# Patient Record
Sex: Female | Born: 1955 | Race: White | Hispanic: No | Marital: Married | Smoking: Current some day smoker
Health system: Southern US, Community
[De-identification: ages and names within clinical notes are randomized; demographics above are authoritative.]

## PROBLEM LIST (undated history)

## (undated) DIAGNOSIS — R112 Nausea with vomiting, unspecified: Secondary | ICD-10-CM

## (undated) DIAGNOSIS — C561 Malignant neoplasm of right ovary: Secondary | ICD-10-CM

## (undated) DIAGNOSIS — Z9889 Other specified postprocedural states: Secondary | ICD-10-CM

## (undated) DIAGNOSIS — C779 Secondary and unspecified malignant neoplasm of lymph node, unspecified: Secondary | ICD-10-CM

## (undated) DIAGNOSIS — F419 Anxiety disorder, unspecified: Secondary | ICD-10-CM

## (undated) DIAGNOSIS — I341 Nonrheumatic mitral (valve) prolapse: Secondary | ICD-10-CM

## (undated) DIAGNOSIS — I10 Essential (primary) hypertension: Secondary | ICD-10-CM

## (undated) DIAGNOSIS — R0602 Shortness of breath: Secondary | ICD-10-CM

## (undated) DIAGNOSIS — F99 Mental disorder, not otherwise specified: Secondary | ICD-10-CM

## (undated) HISTORY — PX: VASCULAR ACCESS DEVICE INSERTION: SHX5158

## (undated) HISTORY — DX: Malignant neoplasm of right ovary: C56.1

## (undated) HISTORY — DX: Secondary and unspecified malignant neoplasm of lymph node, unspecified: C77.9

## (undated) HISTORY — PX: LEEP: SHX91

## (undated) HISTORY — PX: WISDOM TOOTH EXTRACTION: SHX21

---

## 1999-12-13 ENCOUNTER — Ambulatory Visit (HOSPITAL_COMMUNITY): Admission: RE | Admit: 1999-12-13 | Discharge: 1999-12-13 | Payer: Self-pay | Admitting: Gynecology

## 1999-12-13 ENCOUNTER — Encounter (INDEPENDENT_AMBULATORY_CARE_PROVIDER_SITE_OTHER): Payer: Self-pay | Admitting: Specialist

## 2000-12-08 ENCOUNTER — Other Ambulatory Visit: Admission: RE | Admit: 2000-12-08 | Discharge: 2000-12-08 | Payer: Self-pay | Admitting: Gynecology

## 2001-12-30 ENCOUNTER — Other Ambulatory Visit: Admission: RE | Admit: 2001-12-30 | Discharge: 2001-12-30 | Payer: Self-pay | Admitting: Gynecology

## 2003-01-05 ENCOUNTER — Other Ambulatory Visit: Admission: RE | Admit: 2003-01-05 | Discharge: 2003-01-05 | Payer: Self-pay | Admitting: Gynecology

## 2004-01-29 ENCOUNTER — Other Ambulatory Visit: Admission: RE | Admit: 2004-01-29 | Discharge: 2004-01-29 | Payer: Self-pay | Admitting: Gynecology

## 2005-02-03 ENCOUNTER — Other Ambulatory Visit: Admission: RE | Admit: 2005-02-03 | Discharge: 2005-02-03 | Payer: Self-pay | Admitting: Gynecology

## 2006-03-17 ENCOUNTER — Other Ambulatory Visit: Admission: RE | Admit: 2006-03-17 | Discharge: 2006-03-17 | Payer: Self-pay | Admitting: Gynecology

## 2007-03-24 ENCOUNTER — Other Ambulatory Visit: Admission: RE | Admit: 2007-03-24 | Discharge: 2007-03-24 | Payer: Self-pay | Admitting: Gynecology

## 2008-06-21 ENCOUNTER — Ambulatory Visit (HOSPITAL_COMMUNITY): Admission: RE | Admit: 2008-06-21 | Discharge: 2008-06-21 | Payer: Self-pay | Admitting: Gynecology

## 2009-12-25 ENCOUNTER — Encounter: Admission: RE | Admit: 2009-12-25 | Discharge: 2009-12-25 | Payer: Self-pay | Admitting: Gynecology

## 2011-01-22 ENCOUNTER — Other Ambulatory Visit: Payer: Self-pay | Admitting: Gynecology

## 2011-01-22 DIAGNOSIS — Z1231 Encounter for screening mammogram for malignant neoplasm of breast: Secondary | ICD-10-CM

## 2011-01-27 ENCOUNTER — Ambulatory Visit
Admission: RE | Admit: 2011-01-27 | Discharge: 2011-01-27 | Disposition: A | Discharge status: Home / Self Care | Payer: BLUE CROSS/BLUE SHIELD | Source: Ambulatory Visit | Attending: Gynecology | Admitting: Gynecology

## 2011-01-27 ENCOUNTER — Other Ambulatory Visit: Payer: Self-pay | Admitting: Gynecology

## 2011-01-27 DIAGNOSIS — R928 Other abnormal and inconclusive findings on diagnostic imaging of breast: Secondary | ICD-10-CM

## 2011-01-27 DIAGNOSIS — Z1231 Encounter for screening mammogram for malignant neoplasm of breast: Secondary | ICD-10-CM

## 2011-01-28 ENCOUNTER — Ambulatory Visit
Admission: RE | Admit: 2011-01-28 | Discharge: 2011-01-28 | Disposition: A | Discharge status: Home / Self Care | Payer: BLUE CROSS/BLUE SHIELD | Source: Ambulatory Visit | Attending: Gynecology | Admitting: Gynecology

## 2011-01-28 DIAGNOSIS — R928 Other abnormal and inconclusive findings on diagnostic imaging of breast: Secondary | ICD-10-CM

## 2011-01-29 ENCOUNTER — Other Ambulatory Visit: Payer: BLUE CROSS/BLUE SHIELD

## 2011-04-07 ENCOUNTER — Encounter: Payer: Self-pay | Admitting: Family Medicine

## 2011-04-07 ENCOUNTER — Inpatient Hospital Stay (INDEPENDENT_AMBULATORY_CARE_PROVIDER_SITE_OTHER)
Admission: RE | Admit: 2011-04-07 | Discharge: 2011-04-07 | Disposition: A | Discharge status: Home / Self Care | Payer: Commercial Managed Care - PPO | Source: Ambulatory Visit | Attending: Family Medicine | Admitting: Family Medicine

## 2011-04-07 DIAGNOSIS — J069 Acute upper respiratory infection, unspecified: Secondary | ICD-10-CM

## 2011-09-15 NOTE — Progress Notes (Signed)
Summary: cough rm 2   Vital Signs:  Patient Profile:   55 Years Old Female CC:      Cough x 2wks Height:     66 inches Weight:      144.25 pounds O2 Sat:      97 % O2 treatment:    Room Air Temp:     98.7 degrees F oral Pulse rate:   97 / minute Resp:     18 per minute BP sitting:   159 / 92  (left arm) Cuff size:   regular  Vitals Entered By: Clemens Catholic LPN (April 07, 2011 9:05 AM)                  Updated Prior Medication List: HRT patch Current Allergies: ! CODEINEHistory of Present Illness Chief Complaint: Cough x 2wks History of Present Illness:  Subjective: Patient complains of a URI that started about two weeks ago.  All symptoms have improved except for a mild lingering cough, somewhat worse at night.  She was prescribed a Z-pack initially.  She feels well otherwise.  She smokes one pack per day    No pleuritic pain No wheezing No nasal congestion at present No post-nasal drainage No sinus pain/pressure No itchy/red eyes No earache No hemoptysis No SOB No fever/chills No nausea No vomiting No abdominal pain No diarrhea No skin rashes No fatigue No myalgias No headache    REVIEW OF SYSTEMS Constitutional Symptoms      Denies fever, chills, night sweats, weight loss, weight gain, and fatigue.  Eyes       Denies change in vision, eye pain, eye discharge, glasses, contact lenses, and eye surgery. Ear/Nose/Throat/Mouth       Complains of sinus problems.      Denies hearing loss/aids, change in hearing, ear pain, ear discharge, dizziness, frequent runny nose, frequent nose bleeds, sore throat, hoarseness, and tooth pain or bleeding.  Respiratory       Complains of dry cough.      Denies productive cough, wheezing, shortness of breath, asthma, bronchitis, and emphysema/COPD.  Cardiovascular       Denies murmurs, chest pain, and tires easily with exhertion.    Gastrointestinal       Denies stomach pain, nausea/vomiting, diarrhea, constipation,  blood in bowel movements, and indigestion. Genitourniary       Denies painful urination, kidney stones, and loss of urinary control. Neurological       Denies paralysis, seizures, and fainting/blackouts. Musculoskeletal       Denies muscle pain, joint pain, joint stiffness, decreased range of motion, redness, swelling, muscle weakness, and gout.  Skin       Denies bruising, unusual mles/lumps or sores, and hair/skin or nail changes.  Psych       Denies mood changes, temper/anger issues, anxiety/stress, speech problems, depression, and sleep problems. Other Comments: pt states that she had a cold 2 wks ago, which turned into a sinus infection. she was given a zpak and all of her s/s improved except for the cough. she states that she does not feel bad and no fever. no OTC meds.   Past History:  Past Medical History: Unremarkable  Past Surgical History: Denies surgical history  Family History: father- deceased from MI  at age 82 Family History Hypertension- both parents  Social History: Current Smoker 1 PPD  Alcohol use-no Drug use-no Smoking Status:  current Drug Use:  no   Objective:  Appearance:  Patient appears healthy, stated age, and  in no acute distress  Eyes:  Pupils are equal, round, and reactive to light and accomodation.  Extraocular movement is intact.  Conjunctivae are not inflamed.  Ears:  Canals normal.  Tympanic membranes normal.   Nose:  Mildly congested turbinates.  No sinus tenderness  Pharynx:  Normal  Neck:  Supple.  No adenopathy is present.  Lungs:  Clear to auscultation.  Breath sounds are equal.  Heart:  Regular rate and rhythm without murmurs, rubs, or gallops.  Abdomen:  Nontender without masses or hepatosplenomegaly.  Bowel sounds are present.  No CVA or flank tenderness.  Extremities:  No edema.   Assessment New Problems: UPPER RESPIRATORY INFECTION, VIRAL (ICD-465.9)  RESOLVING VIRAL URI.  NO EVIDENCE BACTERIAL INFECTION TODAY.  Plan New  Medications/Changes: AZITHROMYCIN 250 MG TABS (AZITHROMYCIN) Two tabs by mouth on day 1, then 1 tab daily on days 2 through 5 (Rx void after 04/14/11)  #6 tabs x 0, 04/07/2011, Donna Christen MD BENZONATATE 200 MG CAPS (BENZONATATE) One by mouth hs as needed cough  #12 x 0, 04/07/2011, Donna Christen MD  New Orders: Pulse Oximetry (single measurment) [96045] New Patient Level III [40981] Planning Comments:   Treat symptomatically for now:  Increase fluid intake, begin expectorant,  cough suppressant at bedtime.  If fever/chills/sweats persist, or if not improving 5  days begin Z-pack (given Rx to hold).  Followup with PCP if not improving 7 to 10 days.   The patient and/or caregiver has been counseled thoroughly with regard to medications prescribed including dosage, schedule, interactions, rationale for use, and possible side effects and they verbalize understanding.  Diagnoses and expected course of recovery discussed and will return if not improved as expected or if the condition worsens. Patient and/or caregiver verbalized understanding.  Prescriptions: AZITHROMYCIN 250 MG TABS (AZITHROMYCIN) Two tabs by mouth on day 1, then 1 tab daily on days 2 through 5 (Rx void after 04/14/11)  #6 tabs x 0   Entered and Authorized by:   Donna Christen MD   Signed by:   Donna Christen MD on 04/07/2011   Method used:   Print then Give to Patient   RxID:   [id number] BENZONATATE 200 MG CAPS (BENZONATATE) One by mouth hs as needed cough  #12 x 0   Entered and Authorized by:   Donna Christen MD   Signed by:   Donna Christen MD on 04/07/2011   Method used:   Print then Give to Patient   RxID:   [id number]   Patient Instructions: 1)  Take plain Robitussin (guaifenesin) with plenty of fluids. 2)  Begin Azithromycin if not improving about 5 days or if persistent fever develops. 3)  Followup with family doctor if not improving 7 to 10 days.   Orders Added: 1)  Pulse Oximetry (single measurment)  [94760] 2)  New Patient Level III [44010]

## 2012-03-11 ENCOUNTER — Other Ambulatory Visit: Payer: Self-pay | Admitting: Gynecology

## 2012-03-11 DIAGNOSIS — Z1231 Encounter for screening mammogram for malignant neoplasm of breast: Secondary | ICD-10-CM

## 2012-03-17 ENCOUNTER — Ambulatory Visit
Admission: RE | Admit: 2012-03-17 | Discharge: 2012-03-17 | Disposition: A | Discharge status: Home / Self Care | Payer: Commercial Managed Care - PPO | Source: Ambulatory Visit | Attending: Gynecology | Admitting: Gynecology

## 2012-03-17 DIAGNOSIS — Z1231 Encounter for screening mammogram for malignant neoplasm of breast: Secondary | ICD-10-CM

## 2012-07-20 ENCOUNTER — Other Ambulatory Visit: Payer: Self-pay | Admitting: Gynecology

## 2013-01-11 ENCOUNTER — Other Ambulatory Visit (HOSPITAL_COMMUNITY)
Admission: RE | Admit: 2013-01-11 | Discharge: 2013-01-11 | Disposition: A | Discharge status: Home / Self Care | Payer: Commercial Managed Care - PPO | Source: Ambulatory Visit | Attending: Otolaryngology | Admitting: Otolaryngology

## 2013-01-11 ENCOUNTER — Other Ambulatory Visit: Payer: Self-pay | Admitting: Otolaryngology

## 2013-01-11 DIAGNOSIS — C760 Malignant neoplasm of head, face and neck: Secondary | ICD-10-CM | POA: Insufficient documentation

## 2013-01-13 ENCOUNTER — Other Ambulatory Visit: Payer: Self-pay | Admitting: Otolaryngology

## 2013-01-13 DIAGNOSIS — C3490 Malignant neoplasm of unspecified part of unspecified bronchus or lung: Secondary | ICD-10-CM

## 2013-01-14 ENCOUNTER — Ambulatory Visit
Admission: RE | Admit: 2013-01-14 | Discharge: 2013-01-14 | Disposition: A | Discharge status: Home / Self Care | Payer: Commercial Managed Care - PPO | Source: Ambulatory Visit | Attending: Otolaryngology | Admitting: Otolaryngology

## 2013-01-14 DIAGNOSIS — C3490 Malignant neoplasm of unspecified part of unspecified bronchus or lung: Secondary | ICD-10-CM

## 2013-01-14 MED ORDER — IOHEXOL 300 MG/ML  SOLN
75.0000 mL | Freq: Once | INTRAMUSCULAR | Status: AC | PRN
  Administered 2013-01-14: 75 mL via INTRAVENOUS

## 2013-01-18 ENCOUNTER — Encounter (HOSPITAL_COMMUNITY): Payer: Self-pay

## 2013-01-18 NOTE — H&P (Signed)
Assessment   Cervical lymphadenopathy (785.6) (R59.0).  Smokes tobacco daily (305.1) (F17.200). Discussed  Slowly enlarging but small conglomeration of pathologic lymph nodes in the left supraclavicular area. This is concerning for metastatic carcinoma. FNA was performed. We will discuss the next step in workup after the FNA results are back. Strongly urged her to try to stop smoking. Reason For Visit  Deborah Macdonald is here today at the kind request of Primus Bravo for consultation and opinion. Knot on left side of neck. HPI  About 6 months ago, she noticed a small lump in the left lower neck area. She has no other symptoms related to this. She denies any sore throat, ear pain, trouble swallowing or hoarseness. She feels that the lump has gotten larger. She denies any GI symptoms as well. Ultrasound reveals a group of small lymph nodes. Chest x-ray was negative. Allergies  Codeine Derivatives. Current Meds  ALPRAZolam 0.5 MG Oral Tablet;; RPT Triamterene-HCTZ 50-25 MG Oral Capsule;; RPT. Active Problems  Hypertension  (401.9) (I10). PSH  Oral Surgery Tooth Extraction. Family Hx  Family history of Alzheimer's disease (V17.2) (Z82.0) No pertinent family history: Mother. Personal Hx  Current every day smoker (305.1) (F17.200) Daily caffeine consumption, 1 serving a day. ROS  Systemic: Not feeling tired (fatigue).  No fever, no night sweats, and no recent weight loss. Head: No headache. Eyes: No eye symptoms. Otolaryngeal: No hearing loss, no earache, no tinnitus, and no purulent nasal discharge.  No nasal passage blockage (stuffiness), no snoring, no sneezing, no hoarseness, and no sore throat. Cardiovascular: No chest pain or discomfort  and no palpitations. Pulmonary: No dyspnea, no cough, and no wheezing. Gastrointestinal: No dysphagia  and no heartburn.  No nausea, no abdominal pain, and no melena.  No diarrhea. Genitourinary: No dysuria. Endocrine: No muscle  weakness. Musculoskeletal: No calf muscle cramps, no arthralgias, and no soft tissue swelling. Neurological: No dizziness, no fainting, no tingling, and no numbness. Psychological: No anxiety  and no depression. Skin: No rash. 12 system ROS was obtained and reviewed on the Health Maintenance form dated today.  Positive responses are shown above.  If the symptom is not checked, the patient has denied it. Vital Signs   Recorded by Rogers,Lisa on 11 Jan 2013 03:47 PM BP:167/91,  Height: 66 in, Weight: 131 lb, BMI: 21.1 kg/m2,  BMI Calculated: 21.14 ,  BSA Calculated: 1.67. Physical Exam  APPEARANCE: Well developed, well nourished, in no acute distress.  Normal affect, in a pleasant mood.  Oriented to time, place and person. COMMUNICATION: Normal voice   HEAD & FACE:  No scars, lesions or masses of head and face.  Sinuses nontender to palpation.  Salivary glands without mass or tenderness.  Facial strength symmetric.  No facial lesion, scars, or mass. EYES: EOMI with normal primary gaze alignment. Visual acuity grossly intact.  PERRLA EXTERNAL EAR & NOSE: No scars, lesions or masses  EAC & TYMPANIC MEMBRANE:  EAC shows no obstructing lesions or debris and tympanic membranes are normal bilaterally with good movement to insufflation. GROSS HEARING: Normal   TMJ:  Nontender  INTRANASAL EXAM: No polyps or purulence.  NASOPHARYNX: Normal, without lesions. LIPS, TEETH & GUMS: No lip lesions, normal dentition and normal gums. ORAL CAVITY/OROPHARYNX:  Oral mucosa moist without lesion or asymmetry of the palate, tongue, tonsil or posterior pharynx. NECK:  Supple without adenopathy or mass, except for a 2-1/2 cm conglomeration of nodes in the left supraclavicular/level V area. THYROID:  Normal with no masses palpable.  NEUROLOGIC:  No gross CN deficits. No nystagmus noted.   LYMPHATIC:  No other enlarged nodes palpable. Procedure  FNA Preop Diagnosis:  Procedure: Using a 10 cc syringe with a 23  gauge needle, the mass aspirated. This was repeated with a separate needle a second time. Cytology samples were prepared. A dressing was applied. Tolerance: Excellent Unplanned interventions: None  Unplanned events: No complications. Signature  Electronically signed by : Serena Colonel  M.D.; 01/11/2013 4:47 PM EST.

## 2013-01-19 ENCOUNTER — Telehealth: Payer: Self-pay | Admitting: Oncology

## 2013-01-19 NOTE — Telephone Encounter (Signed)
01/19/13 phone note in error

## 2013-01-19 NOTE — Telephone Encounter (Signed)
Pt appt. With Dr. Truett Perna 01/25/13@3 :67 Almira Coaster will notify pt of appt. Mailed np packet

## 2013-01-20 ENCOUNTER — Telehealth: Payer: Self-pay | Admitting: Oncology

## 2013-01-20 ENCOUNTER — Encounter (HOSPITAL_COMMUNITY)
Admission: RE | Admit: 2013-01-20 | Discharge: 2013-01-20 | Disposition: A | Discharge status: Home / Self Care | Payer: Commercial Managed Care - PPO | Source: Ambulatory Visit | Attending: Otolaryngology | Admitting: Otolaryngology

## 2013-01-20 ENCOUNTER — Encounter (HOSPITAL_COMMUNITY)
Admission: RE | Admit: 2013-01-20 | Discharge: 2013-01-20 | Disposition: A | Discharge status: Home / Self Care | Payer: Commercial Managed Care - PPO | Source: Ambulatory Visit | Attending: Anesthesiology | Admitting: Anesthesiology

## 2013-01-20 ENCOUNTER — Encounter (HOSPITAL_COMMUNITY): Payer: Self-pay

## 2013-01-20 HISTORY — DX: Anxiety disorder, unspecified: F41.9

## 2013-01-20 HISTORY — DX: Essential (primary) hypertension: I10

## 2013-01-20 HISTORY — DX: Nausea with vomiting, unspecified: R11.2

## 2013-01-20 HISTORY — DX: Nonrheumatic mitral (valve) prolapse: I34.1

## 2013-01-20 HISTORY — DX: Other specified postprocedural states: Z98.890

## 2013-01-20 HISTORY — DX: Shortness of breath: R06.02

## 2013-01-20 LAB — CBC
HCT: 43.7 % (ref 36.0–46.0)
Hemoglobin: 16 g/dL — ABNORMAL HIGH (ref 12.0–15.0)
MCH: 32.5 pg (ref 26.0–34.0)
MCV: 88.6 fL (ref 78.0–100.0)
RBC: 4.93 MIL/uL (ref 3.87–5.11)

## 2013-01-20 LAB — BASIC METABOLIC PANEL
Chloride: 96 mEq/L (ref 96–112)
Glucose, Bld: 113 mg/dL — ABNORMAL HIGH (ref 70–99)
Potassium: 3.7 mEq/L (ref 3.5–5.1)
Sodium: 134 mEq/L — ABNORMAL LOW (ref 135–145)

## 2013-01-20 LAB — SURGICAL PCR SCREEN
MRSA, PCR: NEGATIVE
Staphylococcus aureus: NEGATIVE

## 2013-01-20 NOTE — Telephone Encounter (Signed)
C/D 01/20/13 for appt. 01/27/13

## 2013-01-20 NOTE — Progress Notes (Signed)
Pt states she has shortness of breath at times, even while sitting, been going on for approx 1 month.  Pt also says she has pain under her breast that has bee going on for approx 1 month.  "I think it is due to stress, they think I have lung cancer and I am really nervous.  Pt is taking Xanax prn.

## 2013-01-20 NOTE — Telephone Encounter (Signed)
S/W PT IN REF TO NP APPT. ON 01/27/13@3 :45 REFERRING DR ROSEN DX METASTATIC CA MAILED NP PACKET

## 2013-01-20 NOTE — Pre-Procedure Instructions (Signed)
Deborah Macdonald  01/20/2013   Your procedure is scheduled on:  Friday, April 11th.  Report to [facility] at 6:45AM.  Call this number if you have problems the morning of surgery: [phone]   Remember:   Do not eat food or drink liquids after midnight.   Take these medicines the morning of surgery with A SIP OF WATER:     ALPRAZolam (XANAX) 0.5 MG tablet  If needed.  Stop taking Aspirin, Coumadin, Plavix, Effient and Herbal medications (Fish Oil).  Do not take any NSAIDs ie: Ibuprofen,  Advil,Naproxen or any medication containing Aspirin.   Do not wear jewelry, make-up or nail polish.  Do not wear lotions, powders, or perfumes. You [place] wear deodorant.  Do not shave 48 hours prior to surgery.  Do not bring valuables to the hospital.  Contacts, dentures or bridgework [place] not be worn into surgery.  Leave suitcase in the car. After surgery it [place] be brought to your room.  For patients admitted to the hospital, checkout time is 11:00 AM the day of  discharge.   Patients discharged the day of surgery will not be allowed to drive  home.  Name and phone number of your driver:  Special Instructions:  Shower with CHG wash (Bactoshield) tonight and again in the am prior to arriving to hospital.   Please read over the following fact sheets that you were given: Pain Booklet, Coughing and Deep Breathing and Surgical Site Infection Prevention

## 2013-01-21 ENCOUNTER — Ambulatory Visit (HOSPITAL_COMMUNITY): Payer: Commercial Managed Care - PPO | Admitting: Anesthesiology

## 2013-01-21 ENCOUNTER — Encounter (HOSPITAL_COMMUNITY)
Admission: RE | Disposition: A | Discharge status: Home / Self Care | Payer: Self-pay | Source: Ambulatory Visit | Attending: Otolaryngology

## 2013-01-21 ENCOUNTER — Encounter (HOSPITAL_COMMUNITY): Payer: Self-pay | Admitting: *Deleted

## 2013-01-21 ENCOUNTER — Encounter (HOSPITAL_COMMUNITY): Payer: Self-pay | Admitting: Anesthesiology

## 2013-01-21 ENCOUNTER — Ambulatory Visit (HOSPITAL_COMMUNITY)
Admission: RE | Admit: 2013-01-21 | Discharge: 2013-01-21 | Disposition: A | Discharge status: Home / Self Care | Payer: Commercial Managed Care - PPO | Source: Ambulatory Visit | Attending: Otolaryngology | Admitting: Otolaryngology

## 2013-01-21 DIAGNOSIS — I10 Essential (primary) hypertension: Secondary | ICD-10-CM | POA: Insufficient documentation

## 2013-01-21 DIAGNOSIS — C801 Malignant (primary) neoplasm, unspecified: Secondary | ICD-10-CM | POA: Insufficient documentation

## 2013-01-21 DIAGNOSIS — C771 Secondary and unspecified malignant neoplasm of intrathoracic lymph nodes: Secondary | ICD-10-CM | POA: Insufficient documentation

## 2013-01-21 DIAGNOSIS — F411 Generalized anxiety disorder: Secondary | ICD-10-CM | POA: Insufficient documentation

## 2013-01-21 DIAGNOSIS — F17200 Nicotine dependence, unspecified, uncomplicated: Secondary | ICD-10-CM | POA: Insufficient documentation

## 2013-01-21 HISTORY — PX: LYMPH NODE BIOPSY: SHX201

## 2013-01-21 SURGERY — LYMPH NODE BIOPSY
Anesthesia: General | Site: Neck | Laterality: Left | Wound class: Clean

## 2013-01-21 MED ORDER — LACTATED RINGERS IV SOLN
INTRAVENOUS | Status: DC
  Administered 2013-01-21: 08:00:00 via INTRAVENOUS

## 2013-01-21 MED ORDER — DEXAMETHASONE SODIUM PHOSPHATE 10 MG/ML IJ SOLN
INTRAMUSCULAR | Status: DC | PRN
  Administered 2013-01-21: 10 mg via INTRAVENOUS

## 2013-01-21 MED ORDER — SODIUM CHLORIDE 0.9 % IR SOLN
Status: DC | PRN
  Administered 2013-01-21: 1

## 2013-01-21 MED ORDER — LIDOCAINE-EPINEPHRINE 1 %-1:100000 IJ SOLN: INTRAMUSCULAR | Status: DC | PRN

## 2013-01-21 MED ORDER — PROPOFOL 10 MG/ML IV EMUL
INTRAVENOUS | Status: DC | PRN
  Administered 2013-01-21: 200 mL via INTRAVENOUS
  Administered 2013-01-21: 60 mL via INTRAVENOUS

## 2013-01-21 MED ORDER — FENTANYL CITRATE 0.05 MG/ML IJ SOLN
INTRAMUSCULAR | Status: DC | PRN
  Administered 2013-01-21: 100 ug via INTRAVENOUS
  Administered 2013-01-21: 50 ug via INTRAVENOUS

## 2013-01-21 MED ORDER — METOCLOPRAMIDE HCL 5 MG/ML IJ SOLN
10.0000 mg | Freq: Once | INTRAMUSCULAR | Status: AC | PRN
  Administered 2013-01-21: 10 mg via INTRAVENOUS

## 2013-01-21 MED ORDER — OXYCODONE HCL 5 MG/5ML PO SOLN
5.0000 mg | Freq: Once | ORAL | Status: DC | PRN
Start: 2013-01-21 — End: 2013-01-21

## 2013-01-21 MED ORDER — SUCCINYLCHOLINE CHLORIDE 20 MG/ML IJ SOLN
INTRAMUSCULAR | Status: DC | PRN
  Administered 2013-01-21: 60 mg via INTRAVENOUS

## 2013-01-21 MED ORDER — FENTANYL CITRATE 0.05 MG/ML IJ SOLN: 25.0000 ug | INTRAMUSCULAR | Status: DC | PRN

## 2013-01-21 MED ORDER — BACITRACIN ZINC 500 UNIT/GM EX OINT
TOPICAL_OINTMENT | CUTANEOUS | Status: AC
  Filled 2013-01-21: qty 15

## 2013-01-21 MED ORDER — OXYCODONE HCL 5 MG PO TABS: 5.0000 mg | ORAL_TABLET | Freq: Once | ORAL | Status: DC | PRN

## 2013-01-21 MED ORDER — LACTATED RINGERS IV SOLN
INTRAVENOUS | Status: DC | PRN
  Administered 2013-01-21: 09:00:00 via INTRAVENOUS

## 2013-01-21 MED ORDER — HYDROCODONE-ACETAMINOPHEN 7.5-325 MG PO TABS: 1.0000 | ORAL_TABLET | Freq: Four times a day (QID) | ORAL | Status: DC | PRN

## 2013-01-21 MED ORDER — CEFAZOLIN SODIUM-DEXTROSE 2-3 GM-% IV SOLR
2.0000 g | INTRAVENOUS | Status: AC
  Administered 2013-01-21: 2 g via INTRAVENOUS
  Filled 2013-01-21: qty 50

## 2013-01-21 MED ORDER — METOCLOPRAMIDE HCL 5 MG/ML IJ SOLN
INTRAMUSCULAR | Status: AC
  Filled 2013-01-21: qty 2

## 2013-01-21 MED ORDER — ACETAMINOPHEN 10 MG/ML IV SOLN
INTRAVENOUS | Status: AC
  Filled 2013-01-21: qty 100

## 2013-01-21 MED ORDER — DEXTROSE 5 % IV SOLN
INTRAVENOUS | Status: DC | PRN
  Administered 2013-01-21 (×2): via INTRAVENOUS

## 2013-01-21 MED ORDER — ACETAMINOPHEN 10 MG/ML IV SOLN
INTRAVENOUS | Status: DC | PRN
  Administered 2013-01-21: 1000 mg via INTRAVENOUS

## 2013-01-21 MED ORDER — PROMETHAZINE HCL 25 MG RE SUPP: 25.0000 mg | Freq: Four times a day (QID) | RECTAL | Status: DC | PRN

## 2013-01-21 MED ORDER — LIDOCAINE-EPINEPHRINE 1 %-1:100000 IJ SOLN
INTRAMUSCULAR | Status: AC
  Filled 2013-01-21: qty 1

## 2013-01-21 MED ORDER — ONDANSETRON HCL 4 MG/2ML IJ SOLN
INTRAMUSCULAR | Status: DC | PRN
  Administered 2013-01-21: 4 mg via INTRAVENOUS

## 2013-01-21 MED ORDER — LIDOCAINE HCL (CARDIAC) 20 MG/ML IV SOLN
INTRAVENOUS | Status: DC | PRN
  Administered 2013-01-21: 60 mg via INTRAVENOUS

## 2013-01-21 MED ORDER — MIDAZOLAM HCL 5 MG/5ML IJ SOLN
INTRAMUSCULAR | Status: DC | PRN
  Administered 2013-01-21: 2 mg via INTRAVENOUS

## 2013-01-21 SURGICAL SUPPLY — 49 items
APPLIER CLIP 9.375 SM OPEN (CLIP)
CANISTER SUCTION 2500CC (MISCELLANEOUS) ×2 IMPLANT
CLEANER TIP ELECTROSURG 2X2 (MISCELLANEOUS) ×2 IMPLANT
CLIP APPLIE 9.375 SM OPEN (CLIP) IMPLANT
CLOTH BEACON ORANGE TIMEOUT ST (SAFETY) ×2 IMPLANT
CONT SPEC 4OZ CLIKSEAL STRL BL (MISCELLANEOUS) ×2 IMPLANT
CORDS BIPOLAR (ELECTRODE) ×2 IMPLANT
COVER SURGICAL LIGHT HANDLE (MISCELLANEOUS) ×2 IMPLANT
DECANTER SPIKE VIAL GLASS SM (MISCELLANEOUS) ×2 IMPLANT
DERMABOND ADVANCED (GAUZE/BANDAGES/DRESSINGS) ×1
DERMABOND ADVANCED .7 DNX12 (GAUZE/BANDAGES/DRESSINGS) ×1 IMPLANT
DRAIN CHANNEL 15F RND FF W/TCR (WOUND CARE) IMPLANT
DRAPE INCISE 23X17 IOBAN STRL (DRAPES)
DRAPE INCISE IOBAN 23X17 STRL (DRAPES) IMPLANT
ELECT COATED BLADE 2.86 ST (ELECTRODE) ×2 IMPLANT
ELECT REM PT RETURN 9FT ADLT (ELECTROSURGICAL) ×2
ELECTRODE REM PT RTRN 9FT ADLT (ELECTROSURGICAL) ×1 IMPLANT
EVACUATOR SILICONE 100CC (DRAIN) IMPLANT
GAUZE SPONGE 4X4 16PLY XRAY LF (GAUZE/BANDAGES/DRESSINGS) ×2 IMPLANT
GLOVE ECLIPSE 7.5 STRL STRAW (GLOVE) ×2 IMPLANT
GLOVE SURG SS PI 6.5 STRL IVOR (GLOVE) ×2 IMPLANT
GOWN STRL NON-REIN LRG LVL3 (GOWN DISPOSABLE) ×4 IMPLANT
KIT BASIN OR (CUSTOM PROCEDURE TRAY) ×2 IMPLANT
KIT ROOM TURNOVER OR (KITS) ×2 IMPLANT
LOCATOR NERVE 3 VOLT (DISPOSABLE) IMPLANT
NEEDLE HYPO 25GX1X1/2 BEV (NEEDLE) ×2 IMPLANT
NS IRRIG 1000ML POUR BTL (IV SOLUTION) ×2 IMPLANT
PAD ARMBOARD 7.5X6 YLW CONV (MISCELLANEOUS) ×4 IMPLANT
PENCIL FOOT CONTROL (ELECTRODE) ×2 IMPLANT
SOLUTION BETADINE 4OZ (MISCELLANEOUS) ×2 IMPLANT
SPECIMEN JAR MEDIUM (MISCELLANEOUS) IMPLANT
SPONGE INTESTINAL PEANUT (DISPOSABLE) IMPLANT
SPONGE LAP 18X18 X RAY DECT (DISPOSABLE) IMPLANT
STAPLER VISISTAT 35W (STAPLE) ×2 IMPLANT
SUT CHROMIC 3 0 SH 27 (SUTURE) ×2 IMPLANT
SUT CHROMIC 5 0 P 3 (SUTURE) IMPLANT
SUT ETHILON 3 0 PS 1 (SUTURE) IMPLANT
SUT ETHILON 5 0 PS 2 18 (SUTURE) IMPLANT
SUT SILK 2 0 REEL (SUTURE) IMPLANT
SUT SILK 3 0 SH CR/8 (SUTURE) IMPLANT
SUT SILK 4 0 REEL (SUTURE) IMPLANT
SUT VIC AB 3-0 SH 18 (SUTURE) IMPLANT
TOWEL OR 17X24 6PK STRL BLUE (TOWEL DISPOSABLE) ×2 IMPLANT
TOWEL OR 17X26 10 PK STRL BLUE (TOWEL DISPOSABLE) ×2 IMPLANT
TOWEL OR NON WOVEN STRL DISP B (DISPOSABLE) ×2 IMPLANT
TRAY ENT MC OR (CUSTOM PROCEDURE TRAY) ×2 IMPLANT
TRAY FOLEY CATH 14FRSI W/METER (CATHETERS) IMPLANT
TUBE FEEDING 10FR FLEXIFLO (MISCELLANEOUS) IMPLANT
WATER STERILE IRR 1000ML POUR (IV SOLUTION) IMPLANT

## 2013-01-21 NOTE — Anesthesia Procedure Notes (Signed)
Procedure Name: Intubation Date/Time: 01/21/2013 9:03 AM Performed by: Tyrone Nine Pre-anesthesia Checklist: Patient identified, Emergency Drugs available, Suction available, Patient being monitored and Timeout performed Patient Re-evaluated:Patient Re-evaluated prior to inductionOxygen Delivery Method: Circle system utilized Preoxygenation: Pre-oxygenation with 100% oxygen Intubation Type: IV induction Ventilation: Mask ventilation without difficulty LMA: LMA inserted LMA Size: 4.0 and 3.0 Laryngoscope Size: Mac and 3 Grade View: Grade III Tube type: Oral Tube size: 7.5 mm Number of attempts: 3 Airway Equipment and Method: Stylet Placement Confirmation: ETT inserted through vocal cords under direct vision,  positive ETCO2,  CO2 detector and breath sounds checked- equal and bilateral Secured at: 21 cm Tube secured with: Tape Dental Injury: Teeth and Oropharynx as per pre-operative assessment  Difficulty Due To: Difficult Airway- due to anterior larynx Comments: Attempted insertion of LMA # 3 and # 4  Unable to seat in posterior larynx.  Opted to intubate.  Anterior larynx

## 2013-01-21 NOTE — Anesthesia Preprocedure Evaluation (Addendum)
Anesthesia Evaluation  Patient identified by MRN, date of birth, ID band Patient awake    Reviewed: Allergy & Precautions, H&P , NPO status , Patient's Chart, lab work & pertinent test results, reviewed documented beta blocker date and time   History of Anesthesia Complications (+) PONV  Airway Mallampati: II TM Distance: >3 FB Neck ROM: full    Dental  (+) Dental Advisory Given and Teeth Intact   Pulmonary shortness of breath and with exertion, former smoker,  01-20-13 Chest X-ray Findings: Lungs are clear.  Heart size is normal.  No pneumothorax or pleural effusion.   IMPRESSION: No acute disease.    breath sounds clear to auscultation        Cardiovascular Exercise Tolerance: Poor hypertension, On Medications and Pt. on medications Rhythm:regular     Neuro/Psych Anxiety negative neurological ROS  negative psych ROS   GI/Hepatic negative GI ROS, Neg liver ROS,   Endo/Other  negative endocrine ROS  Renal/GU negative Renal ROS  negative genitourinary   Musculoskeletal   Abdominal   Peds  Hematology negative hematology ROS (+)   Anesthesia Other Findings See surgeon's H&P   Reproductive/Obstetrics negative OB ROS                        Anesthesia Physical Anesthesia Plan  ASA: II  Anesthesia Plan: General   Post-op Pain Management:    Induction: Intravenous  Airway Management Planned: LMA  Additional Equipment:   Intra-op Plan:   Post-operative Plan:   Informed Consent: I have reviewed the patients History and Physical, chart, labs and discussed the procedure including the risks, benefits and alternatives for the proposed anesthesia with the patient or authorized representative who has indicated his/her understanding and acceptance.   Dental Advisory Given  Plan Discussed with: CRNA and Surgeon  Anesthesia Plan Comments:         Anesthesia Quick Evaluation

## 2013-01-21 NOTE — Preoperative (Signed)
Beta Blockers   Reason not to administer Beta Blockers:Not Applicable 

## 2013-01-21 NOTE — Anesthesia Postprocedure Evaluation (Signed)
Anesthesia Post Note  Patient: Deborah Macdonald  Procedure(s) Performed: Procedure(s) (LRB): EXCISIONAL BIOPSY OF THE NECK (Left)  Anesthesia type: General  Patient location: PACU  Post pain: Pain level controlled  Post assessment: Patient's Cardiovascular Status Stable  Last Vitals:  Filed Vitals:   01/21/13 1122  BP:   Pulse:   Temp: 37.1 C  Resp:     Post vital signs: Reviewed and stable  Level of consciousness: alert  Complications: No apparent anesthesia complications

## 2013-01-21 NOTE — Interval H&P Note (Signed)
History and Physical Interval Note:  01/21/2013 8:10 AM  Deborah Macdonald  has presented today for surgery, with the diagnosis of cancer  The various methods of treatment have been discussed with the patient and family. After consideration of risks, benefits and other options for treatment, the patient has consented to  Procedure(s): EXCISIONAL BIOPSY OF THE NECK (N/A) as a surgical intervention .  The patient's history has been reviewed, patient examined, no change in status, stable for surgery.  I have reviewed the patient's chart and labs.  Questions were answered to the patient's satisfaction.     Deborah Macdonald

## 2013-01-21 NOTE — Op Note (Signed)
OPERATIVE REPORT  DATE OF SURGERY: 01/21/2013  PATIENT:  Oleh Genin Palecek,  56 y.o. female  PRE-OPERATIVE DIAGNOSIS:  Metastatic Cancer  POST-OPERATIVE DIAGNOSIS:  Metastatic Cancer  PROCEDURE:  Procedure(s): EXCISIONAL BIOPSY OF THE NECK  SURGEON:  Susy Frizzle, MD  ASSISTANTS: None   ANESTHESIA:   General   EBL:  Less than 5 ml  DRAINS: None   LOCAL MEDICATIONS USED:  None  SPECIMEN:  Left cervical node  COUNTS:  Correct  PROCEDURE DETAILS: The patient was taken to the operating room and placed on the operating table in the supine position. Following induction of general endotracheal anesthesia the left side of the neck was prepped and draped in a standard fashion. Electrocautery was used to incise the skin overlying the lesion, about 2 cm in length parallel to the clavicle. Blunt dissection was used to expose the lymph node. This was about 2 or 3 cm posterior to the external jugular vein. The node was dissected from surrounding tissue. Electrocautery was used for hemostasis. The spinal accessory nerve was not identified. The wound was irrigated with saline. The specimen was sent for pathologic evaluation. The wound was closed using 3-0 chromic interrupted for the deep and subcuticular layer and Dermabond on the skin. Patient was awakened extubated and transferred to recovery  in stable condition.   PATIENT DISPOSITION:  To PACU, stable

## 2013-01-21 NOTE — Transfer of Care (Signed)
Immediate Anesthesia Transfer of Care Note  Patient: Deborah Macdonald  Procedure(s) Performed: Procedure(s): EXCISIONAL BIOPSY OF THE NECK (Left)  Patient Location: PACU and ICU  Anesthesia Type:General  Level of Consciousness: awake, alert , oriented and patient cooperative  Airway & Oxygen Therapy: Patient Spontanous Breathing and Patient connected to nasal cannula oxygen  Post-op Assessment: Report given to PACU RN and Post -op Vital signs reviewed and stable  Post vital signs: Reviewed and stable  Complications: No apparent anesthesia complications

## 2013-01-25 ENCOUNTER — Encounter (HOSPITAL_COMMUNITY): Payer: Self-pay | Admitting: Otolaryngology

## 2013-01-27 ENCOUNTER — Other Ambulatory Visit (HOSPITAL_BASED_OUTPATIENT_CLINIC_OR_DEPARTMENT_OTHER): Payer: Commercial Managed Care - PPO | Admitting: Lab

## 2013-01-27 ENCOUNTER — Encounter: Payer: Self-pay | Admitting: Oncology

## 2013-01-27 ENCOUNTER — Ambulatory Visit: Payer: Commercial Managed Care - PPO

## 2013-01-27 ENCOUNTER — Ambulatory Visit (HOSPITAL_BASED_OUTPATIENT_CLINIC_OR_DEPARTMENT_OTHER): Payer: Commercial Managed Care - PPO | Admitting: Oncology

## 2013-01-27 VITALS — BP 165/95 | HR 117 | Temp 98.5°F | Resp 20 | Ht 66.0 in | Wt 129.0 lb

## 2013-01-27 DIAGNOSIS — R590 Localized enlarged lymph nodes: Secondary | ICD-10-CM

## 2013-01-27 DIAGNOSIS — C801 Malignant (primary) neoplasm, unspecified: Secondary | ICD-10-CM

## 2013-01-27 DIAGNOSIS — R599 Enlarged lymph nodes, unspecified: Secondary | ICD-10-CM

## 2013-01-27 LAB — CBC WITH DIFFERENTIAL/PLATELET
Basophils Absolute: 0 10*3/uL (ref 0.0–0.1)
EOS%: 0.6 % (ref 0.0–7.0)
Eosinophils Absolute: 0.1 10*3/uL (ref 0.0–0.5)
HCT: 44.9 % (ref 34.8–46.6)
HGB: 15.4 g/dL (ref 11.6–15.9)
MCH: 30.7 pg (ref 25.1–34.0)
MONO#: 0.8 10*3/uL (ref 0.1–0.9)
NEUT%: 77.7 % — ABNORMAL HIGH (ref 38.4–76.8)
lymph#: 1.8 10*3/uL (ref 0.9–3.3)

## 2013-01-27 LAB — COMPREHENSIVE METABOLIC PANEL (CC13)
Albumin: 4.3 g/dL (ref 3.5–5.0)
BUN: 12.6 mg/dL (ref 7.0–26.0)
CO2: 24 mEq/L (ref 22–29)
Calcium: 9.9 mg/dL (ref 8.4–10.4)
Chloride: 98 mEq/L (ref 98–107)
Glucose: 125 mg/dl — ABNORMAL HIGH (ref 70–99)
Potassium: 4 mEq/L (ref 3.5–5.1)

## 2013-01-27 MED ORDER — ONDANSETRON HCL 8 MG PO TABS: 8.0000 mg | ORAL_TABLET | Freq: Two times a day (BID) | ORAL | Status: DC

## 2013-01-27 MED ORDER — PROCHLORPERAZINE 25 MG RE SUPP: 25.0000 mg | Freq: Two times a day (BID) | RECTAL | Status: DC | PRN

## 2013-01-27 MED ORDER — PROCHLORPERAZINE MALEATE 10 MG PO TABS: 10.0000 mg | ORAL_TABLET | Freq: Four times a day (QID) | ORAL | Status: DC | PRN

## 2013-01-27 NOTE — Progress Notes (Signed)
Checked in new pt with no financial concerns. °

## 2013-01-27 NOTE — Patient Instructions (Addendum)
1.  diagnosis: Carcinoma of unknown primary; most likely ovarian or breast cancer primary. 2.  work up:  *  Staging PET scan.  *  Diagnostic mammogram.   3.  treatment recommendation:  *  Pending work up.

## 2013-01-27 NOTE — Progress Notes (Signed)
[facility]  [phone] Fax:([phone]) [phone]   MEDICAL ONCOLOGY - INITIAL CONSULATION    Referral MD:  Dr. Serena Colonel, M.D.   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   HPI:  [name] is a 57 year old woman with history of smoking, hypertension, anxiety.  She presented with left supraclavicular lymph node swelling in September 2013.  She underwent ultrasound and chest x-ray which were negative. She had an empiric course of antibiotic without improvement. She was lost to followup until earlier this year when she again had progressive enlargement of the left supraclavicular lymph node. Her PCP sent her to Dr. Pollyann Kennedy who did an excisional biopsy on 01/21/2013 which showed malignant cells which were  positive for cytokeratin 7, estrogen receptor, progesterone receptor, and focally for p53. They are negative for cytokeratin 20, GCDFP, Napsin-A, , thyroglobulin, and TTF-1. Thisimmunohistochemical profile raises the possibility of metastatic gynecologic carcinoma or metastatic breast carcinoma.  She was kindly referred to the cancer Center for evaluation.  [name] presented to the clinic for the first time today with her husband and her daughter. She reports feeling very nervous and anxious. She denies lymph node swelling anywhere else. She denies palpable breast mass, skin thickening of the breast, nipple discharge, nipple inversion, axilla adenopathy. She denies any abdominal pain or swelling, vaginal bleeding.  Patient denies fever, anorexia, weight loss, fatigue, headache, visual changes, confusion, drenching night sweats, mucositis, odynophagia, dysphagia, nausea vomiting, jaundice, chest pain, palpitation, shortness of breath, dyspnea on exertion, productive cough, gum bleeding, epistaxis, hematemesis, hemoptysis, abdominal pain, abdominal swelling, early satiety, melena, hematochezia, hematuria, skin  rash, spontaneous bleeding, joint swelling, joint pain, heat or cold intolerance, bowel bladder incontinence, back pain, focal motor weakness, paresthesia.    Past Medical History  Diagnosis Date  . PONV (postoperative nausea and vomiting)   . MVP (mitral valve prolapse)   . Hypertension   . Anxiety   . Shortness of breath     at times "anxiety"  . Carcinoma   :  Past Surgical History  Procedure Laterality Date  . Wisdom tooth extraction    . Lymph node biopsy Left 01/21/2013    Procedure: EXCISIONAL BIOPSY OF THE NECK;  Surgeon: Serena Colonel, MD;  Location: [facility] OR;  Service: ENT;  Laterality: Left;  :  Current Outpatient Prescriptions  Medication Sig Dispense Refill  . ALPRAZolam (XANAX) 0.5 MG tablet Take 0.5 mg by mouth 3 (three) times daily.      . Aspirin-Salicylamide-Caffeine (BC HEADACHE POWDER PO) Take 1 packet by mouth every 6 (six) hours as needed (headache).      . estradiol (MINIVELLE) 0.075 MG/24HR Place 1 patch onto the skin 2 (two) times a week.      . fish oil-omega-3 fatty acids 1000 MG capsule Take 1 g by mouth daily.      Marland Kitchen ibuprofen (ADVIL,MOTRIN) 200 MG tablet Take 600 mg by mouth every 6 (six) hours as needed for pain or headache.      . triamterene-hydrochlorothiazide (MAXZIDE-25) 37.5-25 MG per tablet Take 1 tablet by mouth daily.      . ondansetron (ZOFRAN) 8 MG tablet Take 1 tablet (8 mg total) by mouth 2 (two) times daily. Take two times a day starting the day after chemo for 3 days. Then take two times a day as needed for nausea or vomiting.  30 tablet  1  . prochlorperazine (COMPAZINE) 10 MG tablet Take 1  tablet (10 mg total) by mouth every 6 (six) hours as needed (Nausea or vomiting).  30 tablet  1  . prochlorperazine (COMPAZINE) 25 MG suppository Place 1 suppository (25 mg total) rectally every 12 (twelve) hours as needed for nausea.  12 suppository  3   No current facility-administered medications for this visit.     Allergies  Allergen Reactions  .  Codeine Nausea Only  :  Family History  Problem Relation Age of Onset  . Heart attack Father   . Cancer Maternal Uncle     brain tumor?  :  History   Social History  . Marital Status: Married    Spouse Name: N/A    Number of Children: 3  . Years of Education: N/A   Occupational History  . [employer]     Social History Main Topics  . Smoking status: Former Smoker -- 1.00 packs/day for 39 years    Quit date: 01/13/2013  . Smokeless tobacco: Never Used  . Alcohol Use: 1.0 oz/week    2 drink(s) per week  . Drug Use: No  . Sexually Active: Not on file   Other Topics Concern  . Not on file   Social History Narrative  . No narrative on file  :   Exam: ECOG 0.   General:  Thin-appearing woman, in no acute distress.  Eyes:  no scleral icterus.  ENT:  There were no oropharyngeal lesions.  Neck was without thyromegaly.  Lymphatics:  Negative cervical, supraclavicular or axillary adenopathy.  Respiratory: lungs were clear bilaterally without wheezing or crackles.  Cardiovascular:  Regular rate and rhythm, S1/S2, without murmur, rub or gallop.  There was no pedal edema.  GI:  abdomen was soft, flat, nontender, nondistended, without organomegaly.  Muscoloskeletal:  no spinal tenderness of palpation of vertebral spine.  Skin exam was without echymosis, petichae.  Neuro exam was nonfocal.  Patient was able to get on and off exam table without assistance.  Gait was normal.  Patient was alerted and oriented.  Attention was good.   Language was appropriate.  Mood was normal without depression.  Speech was not pressured.  Thought content was not tangential.  There was a 3 cm right breast mass around the nipple. It was mobile, nontender. Bilateral breast exam was negative for skin thickening, erythema, nipple discharge, nipple inversion.   Lab Results  Component Value Date   WBC 12.2* 01/27/2013   HGB 15.4 01/27/2013   HCT 44.9 01/27/2013   PLT 307 01/27/2013   GLUCOSE 125* 01/27/2013   ALT  8 01/27/2013   AST 13 01/27/2013   NA 134* 01/27/2013   K 4.0 01/27/2013   CL 98 01/27/2013   CREATININE 0.7 01/27/2013   BUN 12.6 01/27/2013   CO2 24 01/27/2013    Ct Chest W Contrast  01/14/2013  *RADIOLOGY REPORT*  Clinical Data: Non-small cell cancer diagnosed on a biopsy of a left supraclavicular node.  CT CHEST WITH CONTRAST  Technique:  Multidetector CT imaging of the chest was performed following the standard protocol during bolus administration of intravenous contrast.  Contrast: 75mL OMNIPAQUE IOHEXOL 300 MG/ML  SOLN  Comparison: None.  Findings: Linear densities in the right lung base, likely scarring. No suspicious pulmonary nodules or masses within the lungs.  No confluent opacities.  No pleural effusions.  Heart is normal size. Aorta is normal caliber.  Small scattered mediastinal and left axillary lymph nodes are not definitively abnormal based on CT size criteria. Small right superior mediastinal node  on image 23 measures 7 mm in short axis diameter. Prevascular node on image 24 has a short axis diameter of 5 mm. Left axillary lymph node on image 22 measures 5 mm.  Left supraclavicular nodule is visualized measuring up to 13 mm.  This is presumably the node previously biopsied.  Chest wall soft tissues are unremarkable.  Imaging into the upper abdomen demonstrates layering gallstones within the gallbladder.  Lenticular shaped soft tissue density along the lateral aspect of the liver on image 71 measures 3.0 x 1.0 cm and is presumably subcapsular in location.  In addition, numerous other subcapsular nodular areas noted along the liver. These are best seen on the coronal reconstructed images.  Numerous soft tissue nodules are noted in the left upper quadrant.  Findings are compatible with peritoneal metastases.  Index peritoneal implant on image 73 measures 2.1 x 1.6 cm.  Partially imaged on the lowest images in the abdomen is retroperitoneal adenopathy.  On the final image (image 76), left  periaortic lymph node measures 2.3 x 1.7 cm.  No acute bone or focal bony abnormality.  IMPRESSION: No suspicious pulmonary nodule within the lungs.  There are small scattered axillary and mediastinal lymph nodes.  Left supraclavicular lymph node is visualized.  Evidence for peritoneal metastases within the visualized upper abdomen.  This is seen both in the left upper quadrant and along the liver surface.  Retroperitoneal adenopathy also partially imaged in the upper abdomen.  These findings can be further evaluated with CT of the abdomen or pelvis.  Given that the cancer diagnosis as are again established, PET CT also can be performed and Charland be helpful.   Original Report Authenticated By: Charlett Nose, M.D.     Assessment and Plan:    1.  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  Exam was notable for possible right breast mass.  2.  work up:  *  Staging PET scan.  *  Diagnostic mammogram.  *  I discussed with pathology and requested Her-2 staining which is pending at this time.;    3.  treatment recommendation:  *  Pending work up.      The length of time of the face-to-face encounter was 30 minutes. More than 50% of time was spent counseling and coordination of care.     Thank you for this referral.

## 2013-01-28 ENCOUNTER — Inpatient Hospital Stay: Admission: RE | Admit: 2013-01-28 | Payer: Commercial Managed Care - PPO | Source: Ambulatory Visit

## 2013-01-28 ENCOUNTER — Telehealth: Payer: Self-pay | Admitting: Oncology

## 2013-01-28 ENCOUNTER — Ambulatory Visit
Admission: RE | Admit: 2013-01-28 | Discharge: 2013-01-28 | Disposition: A | Discharge status: Home / Self Care | Payer: Commercial Managed Care - PPO | Source: Ambulatory Visit | Attending: Oncology | Admitting: Oncology

## 2013-01-28 ENCOUNTER — Other Ambulatory Visit: Payer: Self-pay | Admitting: Oncology

## 2013-01-28 DIAGNOSIS — R590 Localized enlarged lymph nodes: Secondary | ICD-10-CM

## 2013-01-28 DIAGNOSIS — C801 Malignant (primary) neoplasm, unspecified: Secondary | ICD-10-CM

## 2013-01-28 LAB — CANCER ANTIGEN 27.29: CA 27.29: 565 U/mL — ABNORMAL HIGH (ref 0–39)

## 2013-01-28 NOTE — Telephone Encounter (Signed)
Deborah Macdonald at the [facility] and she stated that the pt was comming at 12:00pm

## 2013-01-28 NOTE — Telephone Encounter (Signed)
s.w. pt and advised on mammo/biopsy @ 12:30pm today, pet and est appt....pt ok and aware

## 2013-01-31 ENCOUNTER — Encounter: Payer: Self-pay | Admitting: Oncology

## 2013-01-31 NOTE — Patient Instructions (Addendum)
1.  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   2.  Potential causes:  Less likely breast cancer since no lesion found on mammogram.  PET scan shows disease in the ovary.  3.  Work up:  I will refer patient to GynOnc for evaluation for possibility of resection.  In my opinion, resection is not an easy option at this time due to distant disease.   4.  Treatment:  I recommend chemotherapy Carboplatin/Taxol once every 3 weeks.  Repeat CT scan after about 4 cycles to ascertain response.  I do not advocate delaying chemo for referral to GynOnc.  5.  Potential side effects of the combination regimen of Carboplatin andTaxol which include but not limited to mouth sore, low blood count, infection, infusion reaction, numbness and tingling, muscle pain, bone pain, lung inflammation.

## 2013-02-03 ENCOUNTER — Encounter (HOSPITAL_COMMUNITY)
Admission: RE | Admit: 2013-02-03 | Discharge: 2013-02-03 | Disposition: A | Discharge status: Home / Self Care | Payer: Commercial Managed Care - PPO | Source: Ambulatory Visit | Attending: Oncology | Admitting: Oncology

## 2013-02-03 DIAGNOSIS — C786 Secondary malignant neoplasm of retroperitoneum and peritoneum: Secondary | ICD-10-CM | POA: Insufficient documentation

## 2013-02-03 DIAGNOSIS — C801 Malignant (primary) neoplasm, unspecified: Secondary | ICD-10-CM | POA: Insufficient documentation

## 2013-02-03 DIAGNOSIS — C770 Secondary and unspecified malignant neoplasm of lymph nodes of head, face and neck: Secondary | ICD-10-CM | POA: Insufficient documentation

## 2013-02-03 DIAGNOSIS — C771 Secondary and unspecified malignant neoplasm of intrathoracic lymph nodes: Secondary | ICD-10-CM | POA: Insufficient documentation

## 2013-02-03 DIAGNOSIS — R590 Localized enlarged lymph nodes: Secondary | ICD-10-CM

## 2013-02-03 DIAGNOSIS — C772 Secondary and unspecified malignant neoplasm of intra-abdominal lymph nodes: Secondary | ICD-10-CM | POA: Insufficient documentation

## 2013-02-03 DIAGNOSIS — R1900 Intra-abdominal and pelvic swelling, mass and lump, unspecified site: Secondary | ICD-10-CM | POA: Insufficient documentation

## 2013-02-03 DIAGNOSIS — R599 Enlarged lymph nodes, unspecified: Secondary | ICD-10-CM | POA: Insufficient documentation

## 2013-02-03 LAB — GLUCOSE, CAPILLARY: Glucose-Capillary: 103 mg/dL — ABNORMAL HIGH (ref 70–99)

## 2013-02-03 MED ORDER — FLUDEOXYGLUCOSE F - 18 (FDG) INJECTION
19.2000 | Freq: Once | INTRAVENOUS | Status: AC | PRN
  Administered 2013-02-03: 19.2 via INTRAVENOUS

## 2013-02-04 ENCOUNTER — Telehealth: Payer: Self-pay | Admitting: *Deleted

## 2013-02-04 ENCOUNTER — Telehealth: Payer: Self-pay | Admitting: Oncology

## 2013-02-04 ENCOUNTER — Ambulatory Visit (HOSPITAL_BASED_OUTPATIENT_CLINIC_OR_DEPARTMENT_OTHER): Payer: Commercial Managed Care - PPO | Admitting: Oncology

## 2013-02-04 VITALS — BP 146/92 | HR 107 | Temp 98.2°F | Resp 18 | Ht 66.0 in | Wt 129.5 lb

## 2013-02-04 DIAGNOSIS — C561 Malignant neoplasm of right ovary: Secondary | ICD-10-CM

## 2013-02-04 DIAGNOSIS — R590 Localized enlarged lymph nodes: Secondary | ICD-10-CM

## 2013-02-04 DIAGNOSIS — C779 Secondary and unspecified malignant neoplasm of lymph node, unspecified: Secondary | ICD-10-CM

## 2013-02-04 DIAGNOSIS — C801 Malignant (primary) neoplasm, unspecified: Secondary | ICD-10-CM

## 2013-02-04 MED ORDER — PROCHLORPERAZINE MALEATE 10 MG PO TABS: 10.0000 mg | ORAL_TABLET | Freq: Four times a day (QID) | ORAL | Status: DC | PRN

## 2013-02-04 MED ORDER — ONDANSETRON HCL 8 MG PO TABS: 8.0000 mg | ORAL_TABLET | Freq: Two times a day (BID) | ORAL | Status: DC

## 2013-02-04 NOTE — Telephone Encounter (Signed)
Per staff phone call and POF I have schedueld appts.  [name]  

## 2013-02-04 NOTE — Telephone Encounter (Signed)
gv and printed appt sched for pt for April thru June and avs, scheduled pt for genetics for 6.5.14, Mrs. Mallory Shirk office is closed today so i lvm with pt info and mine..the office will reopen on 4.28.14

## 2013-02-07 ENCOUNTER — Telehealth: Payer: Self-pay | Admitting: Oncology

## 2013-02-07 ENCOUNTER — Telehealth: Payer: Self-pay | Admitting: *Deleted

## 2013-02-07 ENCOUNTER — Telehealth: Payer: Self-pay | Admitting: Gynecologic Oncology

## 2013-02-07 DIAGNOSIS — C801 Malignant (primary) neoplasm, unspecified: Secondary | ICD-10-CM

## 2013-02-07 NOTE — Telephone Encounter (Signed)
Pt called to ask about appt w/ Dr. [name] tomorrow.  Explained reason for referral.  Pt also asks if she can have something for anxiety prior to starting chemo on Wednesday?  She reports feeling very anxious about starting chemo.  She is most concerned about having nausea/ vomiting.  Reviewed nausea meds and also discussed she already has xanax at home.  Suggested she take her xanax as directed at home prior to chemo.  Pt also brought up her decreased appetite and I informed her will make a referral to our dietician.  Encouraged pt to call for any questions or concerns and if nausea meds not working after her chemo.  Pt verbalized understanding.   Left VM for Kathrin Penner, SW to f/u w/ pt for anxiety.

## 2013-02-07 NOTE — Telephone Encounter (Signed)
Consult Note: Gyn-Onc  Consult was requested by Dr. Gaylyn Rong for the evaluation of Paisleigh Maroney Gal 56 y.o. female  CC: Stage IV  Metastatic gyn cancer   Assessment/Plan:  Ms. Vaani Morren Steinfeldt  is a. 57 y.o. year old **   HPI: 57 y.o.  She underwent sexcision of supraclavicular LN 01/21/2013 The malignant cells are positive for cytokeratin 7, estrogen receptor, progesterone receptor, and focally for p53. They are negative for cytokeratin 20, GCDFP, Napsin-A, , thyroglobulin, and TTF-1. This immunohistochemical profile raises the possibility of metastatic gynecologic carcinoma or metastatic breast carcinoma. The former is favored.  01/27/13 PET IMPRESSION:  1. 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 2. Bulky peritoneal metastasis in the upper abdomen and along the capsule of the liver. 3. Hypermetabolic retroperitoneal nodal metastasis. 4. Small hypermetabolic mediastinal nodal metastasis. 5. Supraclavicular hypermetabolic nodal metastasis. Lab Results  Component Value Date   CA125 1202.4* 01/27/2013   Mammogram 01/27/13 Birads 2  Current Meds:  Outpatient Encounter Prescriptions as of 02/07/2013  Medication Sig Dispense Refill  . ALPRAZolam (XANAX) 0.5 MG tablet Take 0.5 mg by mouth 3 (three) times daily.      . Aspirin-Salicylamide-Caffeine (BC HEADACHE POWDER PO) Take 1 packet by mouth every 6 (six) hours as needed (headache).      . estradiol (MINIVELLE) 0.075 MG/24HR Place 1 patch onto the skin 2 (two) times a week.      . fish oil-omega-3 fatty acids 1000 MG capsule Take 1 g by mouth daily.      Marland Kitchen ibuprofen (ADVIL,MOTRIN) 200 MG tablet Take 600 mg by mouth every 6 (six) hours as needed for pain or headache.      . ondansetron (ZOFRAN) 8 MG tablet Take 1 tablet (8 mg total) by mouth 2 (two) times daily. Take two times a day starting the day after chemo for 3 days. Then take two times a day  as needed for nausea or vomiting.  30 tablet  1  . prochlorperazine (COMPAZINE) 10 MG tablet Take 1 tablet (10 mg total) by mouth every 6 (six) hours as needed (Nausea or vomiting).  30 tablet  1  . prochlorperazine (COMPAZINE) 25 MG suppository Place 1 suppository (25 mg total) rectally every 12 (twelve) hours as needed for nausea.  12 suppository  3  . triamterene-hydrochlorothiazide (MAXZIDE-25) 37.5-25 MG per tablet Take 1 tablet by mouth daily.       No facility-administered encounter medications on file as of 02/07/2013.    Allergy:  Allergies  Allergen Reactions  . Codeine Nausea Only    Social Hx:   History   Social History  . Marital Status: Married    Spouse Name: N/A    Number of Children: 3  . Years of Education: N/A   Occupational History  . [employer]     Social History Main Topics  . Smoking status: Former Smoker -- 1.00 packs/day for 39 years    Quit date: 01/13/2013  . Smokeless tobacco: Never Used  . Alcohol Use: 1.0 oz/week    2 drink(s) per week  . Drug Use: No  . Sexually Active: Not on file   Other Topics Concern  . Not on file   Social History Narrative  . No narrative on file    Past Surgical Hx:  Past Surgical History  Procedure Laterality Date  . Wisdom tooth extraction    . Lymph node biopsy Left 01/21/2013  Procedure: EXCISIONAL BIOPSY OF THE NECK;  Surgeon: Serena Colonel, MD;  Location: [facility] OR;  Service: ENT;  Laterality: Left;    Past Medical Hx:  Past Medical History  Diagnosis Date  . PONV (postoperative nausea and vomiting)   . MVP (mitral valve prolapse)   . Hypertension   . Anxiety   . Shortness of breath     at times "anxiety"  . Carcinoma     Past Gynecological History:  ** No LMP recorded. Patient is postmenopausal.  Family Hx:  Family History  Problem Relation Age of Onset  . Heart attack Father   . Cancer Maternal Uncle     brain tumor?    Review of Systems:  Constitutional  Feels well,  ** Skin/Breast   No rash, sores, jaundice, itching, dryness Cardiovascular  No chest pain, shortness of breath, or edema  Pulmonary  No cough or wheeze.  Gastro Intestinal  No nausea, vomitting, or diarrhoea. No bright red blood per rectum, no abdominal pain, change in bowel movement, or constipation.  Genito Urinary  No frequency, urgency, dysuria, ** Musculo Skeletal  No myalgia, arthralgia, joint swelling or pain  Neurologic  No weakness, numbness, change in gait,  Psychology  No depression, anxiety, insomnia.   Vitals:  @v @  Physical Exam: WD in NAD Neck  Supple NROM, without any enlargements.  Lymph Node Survey No cervical supraclavicular or inguinal adenopathy Cardiovascular  Pulse normal rate, regularity and rhythm. S1 and S2 normal.  Lungs  Clear to auscultation bilateraly, without wheezes/crackles/rhonchi. Good air movement.  Skin  No rash/lesions/breakdown  Psychiatry  Alert and oriented to person, place, and time  Abdomen  Normoactive bowel sounds, abdomen soft, non-tender and obese. Surgical  sites intact without evidence of hernia.  Back No CVA tenderness Genito Urinary  Vulva/vagina: Normal external female genitalia.  No lesions. No discharge or bleeding.  Bladder/urethra:  No lesions or masses  Vagina: *   Cervix: Normal appearing, no lesions.  Uterus: Small, mobile, no parametrial involvement or nodularity.  Adnexa: No palpable masses. Rectal  Good tone, no masses no cul de sac nodularity.  Extremities  No bilateral cyanosis, clubbing or edema.   Laurette Schimke, MD, PhD 02/07/2013, 8:30 PM

## 2013-02-07 NOTE — Telephone Encounter (Signed)
advised pt on Dr. [name] appt 4.29.14.Marland KitchenMarland Kitchenpt ok and aware

## 2013-02-08 ENCOUNTER — Other Ambulatory Visit: Payer: Commercial Managed Care - PPO

## 2013-02-08 ENCOUNTER — Telehealth: Payer: Self-pay | Admitting: *Deleted

## 2013-02-08 ENCOUNTER — Ambulatory Visit: Payer: Commercial Managed Care - PPO | Attending: Gynecologic Oncology | Admitting: Gynecologic Oncology

## 2013-02-08 ENCOUNTER — Encounter: Payer: Self-pay | Admitting: Oncology

## 2013-02-08 ENCOUNTER — Encounter: Payer: Self-pay | Admitting: *Deleted

## 2013-02-08 ENCOUNTER — Other Ambulatory Visit: Payer: Self-pay | Admitting: Oncology

## 2013-02-08 ENCOUNTER — Encounter: Payer: Self-pay | Admitting: Gynecologic Oncology

## 2013-02-08 VITALS — BP 122/78 | HR 110 | Temp 98.8°F | Resp 18 | Ht 66.0 in | Wt 128.5 lb

## 2013-02-08 DIAGNOSIS — C569 Malignant neoplasm of unspecified ovary: Secondary | ICD-10-CM | POA: Insufficient documentation

## 2013-02-08 DIAGNOSIS — C561 Malignant neoplasm of right ovary: Secondary | ICD-10-CM

## 2013-02-08 DIAGNOSIS — C786 Secondary malignant neoplasm of retroperitoneum and peritoneum: Secondary | ICD-10-CM | POA: Insufficient documentation

## 2013-02-08 DIAGNOSIS — C775 Secondary and unspecified malignant neoplasm of intrapelvic lymph nodes: Secondary | ICD-10-CM | POA: Insufficient documentation

## 2013-02-08 DIAGNOSIS — C779 Secondary and unspecified malignant neoplasm of lymph node, unspecified: Secondary | ICD-10-CM | POA: Insufficient documentation

## 2013-02-08 NOTE — Telephone Encounter (Signed)
No additional note

## 2013-02-08 NOTE — Progress Notes (Signed)
[facility]  Telephone:([phone] Fax:([phone]   OFFICE PROGRESS NOTE   Cc:  Primus Bravo, NP  DIAGNOSIS:  Metastatic ovarian cancer.   PAST THERAPY:  Biopsy only.   CURRENT THERAPY: due to start palliative chemo soon.   INTERVAL HISTORY: Deborah Macdonald 56 y.o. female returns for regular follow up with her husband to discuss result of recent work up.  She has mild right pelvic discomfort.  She has not needed any pain med.  She denied fever, SOB, chest pain, abdominal pain, bleeding symptoms, neuropathy, skin rash.  She is nervous about the diagnosis.    Past Medical History  Diagnosis Date  . PONV (postoperative nausea and vomiting)   . MVP (mitral valve prolapse)   . Hypertension   . Anxiety   . Shortness of breath     at times "anxiety"  . Carcinoma     Past Surgical History  Procedure Laterality Date  . Wisdom tooth extraction    . Lymph node biopsy Left 01/21/2013    Procedure: EXCISIONAL BIOPSY OF THE NECK;  Surgeon: Serena Colonel, MD;  Location: [facility] OR;  Service: ENT;  Laterality: Left;    Current Outpatient Prescriptions  Medication Sig Dispense Refill  . ALPRAZolam (XANAX) 0.5 MG tablet Take 0.5 mg by mouth 3 (three) times daily.      . Aspirin-Salicylamide-Caffeine (BC HEADACHE POWDER PO) Take 1 packet by mouth every 6 (six) hours as needed (headache).      . estradiol (MINIVELLE) 0.075 MG/24HR Place 1 patch onto the skin 2 (two) times a week.      . fish oil-omega-3 fatty acids 1000 MG capsule Take 1 g by mouth daily.      Marland Kitchen ibuprofen (ADVIL,MOTRIN) 200 MG tablet Take 600 mg by mouth every 6 (six) hours as needed for pain or headache.      . ondansetron (ZOFRAN) 8 MG tablet Take 1 tablet (8 mg total) by mouth 2 (two) times daily. Take two times a day starting the day after chemo for 3 days. Then take two times a day as needed for nausea or vomiting.  30 tablet  1  . prochlorperazine (COMPAZINE) 10 MG tablet Take 1 tablet (10 mg total) by  mouth every 6 (six) hours as needed (Nausea or vomiting).  30 tablet  1  . prochlorperazine (COMPAZINE) 25 MG suppository Place 1 suppository (25 mg total) rectally every 12 (twelve) hours as needed for nausea.  12 suppository  3  . triamterene-hydrochlorothiazide (MAXZIDE-25) 37.5-25 MG per tablet Take 1 tablet by mouth daily.       No current facility-administered medications for this visit.    ALLERGIES:  is allergic to codeine.  REVIEW OF SYSTEMS:  The rest of the 14-point review of system was negative.   Filed Vitals:   02/04/13 0816  BP: 146/92  Pulse: 107  Temp: 98.2 F (36.8 C)  Resp: 18   Wt Readings from Last 3 Encounters:  02/04/13 129 lb 8 oz (58.741 kg)  01/27/13 129 lb (58.514 kg)  04/07/11 144 lb 4 oz (65.431 kg)   ECOG Performance status: 0  PHYSICAL EXAMINATION:   General:  well-nourished woman,  in no acute distress.  Eyes:  no scleral icterus.  ENT:  There were no oropharyngeal lesions.  Neck was without thyromegaly.  Lymphatics:  Negative cervical, supraclavicular or axillary adenopathy.  Respiratory: lungs were clear bilaterally without wheezing or crackles.  Cardiovascular:  Regular rate and rhythm, S1/S2, without murmur, rub  or gallop.  There was no pedal edema.  GI:  abdomen was soft, flat, nontender, nondistended, without organomegaly.  Muscoloskeletal:  no spinal tenderness of palpation of vertebral spine.  Skin exam was without echymosis, petichae.  Neuro exam was nonfocal.  Patient was able to get on and off exam table without assistance.  Gait was normal.  Patient was alert and oriented.  Attention was good.   Language was appropriate.  Mood was normal without depression.  Speech was not pressured.  Thought content was not tangential.         LABORATORY/RADIOLOGY DATA:  Lab Results  Component Value Date   WBC 12.2* 01/27/2013   HGB 15.4 01/27/2013   HCT 44.9 01/27/2013   PLT 307 01/27/2013   GLUCOSE 125* 01/27/2013   ALKPHOS 172* 01/27/2013   ALT 8  01/27/2013   AST 13 01/27/2013   NA 134* 01/27/2013   K 4.0 01/27/2013   CL 98 01/27/2013   CREATININE 0.7 01/27/2013   BUN 12.6 01/27/2013   CO2 24 01/27/2013     US Breast Right  01/28/2013  *RADIOLOGY REPORT*  Clinical Data:  The patient was recently diagnosed with metastatic cancer following known primary following excisional biopsy of a palpable left the supraclavicular lymph node.  Pathology report states that it favors a gynecologic or breast primary.  Right breast mass felt in the periareolar region on the right, on clinical physical exam.  DIGITAL DIAGNOSTIC BILATERAL MAMMOGRAM WITH CAD AND RIGHT BREAST ULTRASOUND:  Comparison:  CT chest 01/14/2013  Findings:  ACR Breast Density Category 3: The breast tissue is heterogeneously dense.  The parenchymal pattern of both breasts appears stable.  No suspicious mass, distortion, or suspicious microcalcification is identified in either breast to suggest malignancy.  There is no skin thickening.  No axillary lymphadenopathy is detected.  Mammographic images were processed with CAD.  On physical exam, I palpate dense fibroglandular breast tissue in the right breast.  A discrete mass is not palpated.  Ultrasound is performed, showing normal fibroglandular breast tissue in the periareolar/subareolar right breast.  Incidental note is made of a 4 mm simple cyst subareolar right breast at 3 o'clock position.  No solid mass or abnormal shadowing is identified in the right breast.  IMPRESSION: No evidence of malignancy in either breast.  A simple cyst is incidentally noted in the right breast on ultrasound.  It is noted that no axillary lymphadenopathy is seen on mammogram or on the recent chest CT.  Given the pattern of peritoneal carcinomatosis seen in the upper abdomen on chest CT, further evaluation for the possibility of ovarian malignancy is suggested. The patient states that she has a PET scan scheduled for next week.  RECOMMENDATION: The patient is scheduled for  a whole body PET scan next week. Bilateral screening mammogram in one years suggested, unless there are additional areas of concern in either breast.  I have discussed the findings and recommendations with the patient. Results were also provided in writing at the conclusion of the visit.  If applicable, a reminder letter will be sent to the patient regarding the next appointment.  BI-RADS CATEGORY 2:  Benign finding(s).   Original Report Authenticated By: Britta Mccreedy, M.D.    Nm Pet Image Initial (pi) Skull Base To Thigh  02/03/2013  *RADIOLOGY REPORT*  Clinical Data: Initial treatment strategy for metastatic carcinoma of unknown origin. Supraclavicular lymphadenopathy.  NUCLEAR MEDICINE PET SKULL BASE TO THIGH  Fasting Blood Glucose:  103  Technique:  19.2  mCi F-18 FDG was injected intravenously. CT data was obtained and used for attenuation correction and anatomic localization only.  (This was not acquired as a diagnostic CT examination.) Additional exam technical data entered on technologist worksheet.  Comparison:  Chest CT 01/14/2013  Findings:  Neck: There are bilateral hypermetabolic supraclavicular lymph nodes and left level five lymph nodes.  Exemplary left supraclavicular lymph node with SUV max = 12.1.  Chest:  There are mildly enlarged hypermetabolic mediastinal lymph nodes consistent with metastasis.  Exemplary right paratracheal measures 7 mm (image 70) with intense SUV max = 8.5. Hypermetabolic subcarinal lymph nodes also noted.  No hypermetabolic pulmonary nodules.  Abdomen/Pelvis:  Within the pelvis,  there is a large cystic and solid mass lesion with peripheral nodular thickening with intense metabolic activity.  This cystic conglomerate measures approximately 8.7 x 8.7 cm with SUV max = 13.7 (image 210).  There is an adjacent cystic and solid mass within the right iliac fossa (image 204)  which is equally intense.  Origin of these lesions is likely the right ovary.  There multiple peritoneal  masslike implants.  The largest is in the anterior right upper abdomen adjacent to the liver margin which measures 5.5 x 3.5 cm with SUV max = 20.1.  Additional peritoneal implants are noted along the capsule of the liver at multiple sites.  There are enlarged and hypermetabolic retroperitoneal lymph nodes with exemplary node measuring 16 mm short axis (image 155).  Skeleton:  No focal hypermetabolic activity to suggest skeletal metastasis.  IMPRESSION:  1.  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 2.  Bulky peritoneal metastasis in the upper abdomen and along the capsule of the liver. 3.  Hypermetabolic retroperitoneal nodal metastasis. 4.  Small hypermetabolic mediastinal nodal metastasis. 5.  Supraclavicular hypermetabolic nodal metastasis.   Original Report Authenticated By: Genevive Bi, M.D.    Mm Digital Diagnostic Bilat  01/28/2013  *RADIOLOGY REPORT*  Clinical Data:  The patient was recently diagnosed with metastatic cancer following known primary following excisional biopsy of a palpable left the supraclavicular lymph node.  Pathology report states that it favors a gynecologic or breast primary.  Right breast mass felt in the periareolar region on the right, on clinical physical exam.  DIGITAL DIAGNOSTIC BILATERAL MAMMOGRAM WITH CAD AND RIGHT BREAST ULTRASOUND:  Comparison:  CT chest 01/14/2013  Findings:  ACR Breast Density Category 3: The breast tissue is heterogeneously dense.  The parenchymal pattern of both breasts appears stable.  No suspicious mass, distortion, or suspicious microcalcification is identified in either breast to suggest malignancy.  There is no skin thickening.  No axillary lymphadenopathy is detected.  Mammographic images were processed with CAD.  On physical exam, I palpate dense fibroglandular breast tissue in the right breast.  A discrete mass is not palpated.   Ultrasound is performed, showing normal fibroglandular breast tissue in the periareolar/subareolar right breast.  Incidental note is made of a 4 mm simple cyst subareolar right breast at 3 o'clock position.  No solid mass or abnormal shadowing is identified in the right breast.  IMPRESSION: No evidence of malignancy in either breast.  A simple cyst is incidentally noted in the right breast on ultrasound.  It is noted that no axillary lymphadenopathy is seen on mammogram or on the recent chest CT.  Given the pattern of peritoneal carcinomatosis seen in the upper abdomen on chest CT, further evaluation for the possibility of ovarian  malignancy is suggested. The patient states that she has a PET scan scheduled for next week.  RECOMMENDATION: The patient is scheduled for a whole body PET scan next week. Bilateral screening mammogram in one years suggested, unless there are additional areas of concern in either breast.  I have discussed the findings and recommendations with the patient. Results were also provided in writing at the conclusion of the visit.  If applicable, a reminder letter will be sent to the patient regarding the next appointment.  BI-RADS CATEGORY 2:  Benign finding(s).   Original Report Authenticated By: Britta Mccreedy, M.D.        ASSESSMENT AND PLAN:   1.  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   2.  Potential causes:  Less likely breast cancer since no lesion found on mammogram.  PET scan shows disease in the ovary.  3.  Work up:  I will refer patient to GynOnc for evaluation for possibility of resection.  In my opinion, resection is not an easy option at this time due to distant disease.   4.  Treatment:  I recommend chemotherapy Carboplatin/Taxol once every 3 weeks.  Repeat CT scan after about 4 cycles to ascertain response.  I do not advocate delaying chemo for referral to GynOnc.  5.  Potential side effects of the combination regimen of  Carboplatin andTaxol which include but not limited to mouth sore, low blood count, infection, infusion reaction, numbness and tingling, muscle pain, bone pain, lung inflammation.    Ms. Manseau and her husband expressed informed understanding and wished to proceed with chemo ASAP before referral to GynOnc.   In preparation for chemo, I referred her to chemo class and pick up antiemetics with Compazine and Zofran. I referred her to Social Work to see if she can find a therapist to discuss her coping mechanism with the new diagnosis.  For anxiolytic, I recommended that she follow up with her PCP.   For follow up, we will see her the day of the 2nd cycle of chemo in about 3-4 weeks.     The length of time of the face-to-face encounter was 45 minutes. More than 50% of time was spent counseling and coordination of care.

## 2013-02-08 NOTE — Progress Notes (Signed)
Consult Note: Gyn-Onc  Consult was requested by Dr. Gaylyn Rong for the evaluation of Deborah Macdonald 57 y.o. female  CC: Stage IV  Metastatic gyn cancer   Assessment/Plan:  Deborah Macdonald Loya  is a. 57 y.o. year old with stage IV B. ovarian carcinoma. The initial presentation was that of extra-abdominal lymphadenopathy. Her CA 125 is elevated to 1202.4.  I agree with Dr. Lodema Pilot plan for neoadjuvant chemotherapy. I shared with the patient that recent data that has demonstrated a dose dense therapy results in better outcomes and therapy every 3 weeks. It is my recommendation that she receive 3-4 cycles of neoadjuvant Taxol platinum therapy followed by repeat imaging after cycle 3 with consideration for interval debulking if the extra-abdominal disease resolves.  The patient understands that after surgery additional chemotherapy will be required.  The patient informs me that she scheduled for cycle 1 of chemotherapy on 02/09/2013.   HPI: 57 y.o. gravida 3 para 3 who noted a left cervical adenopathy in September.  She noted that it was a slowly increasing in size and the initial workup was for evaluation for lung disease.  She underwent excision of supraclavicular LN 01/21/2013 The malignant cells are positive for cytokeratin 7, estrogen receptor, progesterone receptor, and focally for p53. They are negative for cytokeratin 20, GCDFP, Napsin-A, , thyroglobulin, and TTF-1. This immunohistochemical profile raises the possibility of metastatic gynecologic carcinoma or metastatic breast carcinoma. The former is favored.  01/27/13 PET IMPRESSION:  1. 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 2. Bulky peritoneal metastasis in the upper abdomen and along the capsule of the liver. 3. Hypermetabolic retroperitoneal nodal metastasis. 4. Small hypermetabolic mediastinal nodal metastasis. 5. Supraclavicular hypermetabolic  nodal metastasis.  Lab Results  Component Value Date   CA125 1202.4* 01/27/2013   Mammogram 01/27/13 Birads 2  Current Meds:  Outpatient Encounter Prescriptions as of 02/08/2013  Medication Sig Dispense Refill  . ALPRAZolam (XANAX) 0.5 MG tablet Take 0.5 mg by mouth 3 (three) times daily.      . Aspirin-Salicylamide-Caffeine (BC HEADACHE POWDER PO) Take 1 packet by mouth every 6 (six) hours as needed (headache).      . estradiol (MINIVELLE) 0.075 MG/24HR Place 1 patch onto the skin 2 (two) times a week.      . fish oil-omega-3 fatty acids 1000 MG capsule Take 1 g by mouth daily.      Marland Kitchen ibuprofen (ADVIL,MOTRIN) 200 MG tablet Take 600 mg by mouth every 6 (six) hours as needed for pain or headache.      . Progesterone Micronized (PROMETRIUM PO) Take by mouth. Three times yearly      . triamterene-hydrochlorothiazide (MAXZIDE-25) 37.5-25 MG per tablet Take 1 tablet by mouth daily.      . ondansetron (ZOFRAN) 8 MG tablet Take 1 tablet (8 mg total) by mouth 2 (two) times daily. Take two times a day starting the day after chemo for 3 days. Then take two times a day as needed for nausea or vomiting.  30 tablet  1  . prochlorperazine (COMPAZINE) 10 MG tablet Take 1 tablet (10 mg total) by mouth every 6 (six) hours as needed (Nausea or vomiting).  30 tablet  1  . prochlorperazine (COMPAZINE) 25 MG suppository Place 1 suppository (25 mg total) rectally every 12 (twelve) hours as needed for nausea.  12 suppository  3   No facility-administered encounter medications on file as of 02/08/2013.  Allergy:  Allergies  Allergen Reactions  . Codeine Nausea Only    Social Hx:   History   Social History  . Marital Status: Married    Spouse Name: N/A    Number of Children: 3  . Years of Education: N/A   Occupational History  . COLLE[employer]     Social History Main Topics  . Smoking status: Former Smoker -- 1.00 packs/day for 39 years    Quit date: 01/13/2013  . Smokeless tobacco: Never Used  .  Alcohol Use: 1.0 oz/week    2 drink(s) per week  . Drug Use: No  . Sexually Active: Not on file   Other Topics Concern  . Not on file   Social History Narrative  . No narrative on file    Past Surgical Hx:  Past Surgical History  Procedure Laterality Date  . Wisdom tooth extraction    . Lymph node biopsy Left 01/21/2013    Procedure: EXCISIONAL BIOPSY OF THE NECK;  Surgeon: Serena Colonel, MD;  Location: [facility] OR;  Service: ENT;  Laterality: Left;  . Leep      20 year ago    Past Medical Hx:  Past Medical History  Diagnosis Date  . PONV (postoperative nausea and vomiting)   . MVP (mitral valve prolapse)   . Hypertension   . Anxiety   . Shortness of breath     at times "anxiety"  . Carcinoma     Past Gynecological History:  G3 P3 LMP 15 years ago.  Menarche 12.  OCP use x 20 years  H/o LEEP and cervical procedures for cervical dysplasia. Pap 07/2012 wnl  Family History  Problem Relation Age of Onset  . Heart attack Father   . Cancer Maternal Uncle     brain tumor?    Review of Systems:  Constitutional  Feels well Cardiovascular  No chest pain, shortness of breath, or edema  Pulmonary  No cough or wheeze.  Gastro Intestinal  No nausea, vomitting, or diarrhoea. No bright red blood per rectum, no abdominal pain, change in bowel movement, or constipation. Denies early satiety or abdominal bloating Genito Urinary  No frequency, urgency, dysuria, no vaginal bleeding no incontinence Musculo Skeletal  No myalgia, arthralgia, joint swelling or pain  Neurologic  No weakness, numbness, change in gait,  Psychology  No depression, anxiety, insomnia.   Vitals: BP 122/78  Pulse 110  Temp(Src) 98.8 F (37.1 C)  Resp 18  Ht 5\' 6"  (1.676 m)  Wt 128 lb 8 oz (58.287 kg)  BMI 20.75 kg/m2  Physical Exam: WD in NAD Neck  Supple NROM, without any enlargements.  Lymph Node Survey No supraclavicular or inguinal adenopathy incision on left neck. Cardiovascular  Pulse normal  rate, regularity and rhythm. S1 and S2 normal.  Lungs  Clear to auscultation bilateraly, without wheezes/crackles/rhonchi. Good air movement.  Psychiatry  Alert and oriented to person, place, and time  Abdomen  Normoactive bowel sounds, abdomen soft, non-tender and obese. No ascites, no fluid wave  Back No CVA tenderness Genito Urinary  Vulva/vagina: Normal external female genitalia.  No lesions. No discharge or bleeding.  Bladder/urethra:  No lesions or masses  Vagina: atrophic  Cervix: flush with the vaginal vault Uterus: Small, mobile, no parametrial involvement or nodularity.  Adnexa: No palpable masses. Rectal  Good tone, no masses no cul de sac nodularity.  Extremities  No bilateral cyanosis, clubbing or edema.   Laurette Schimke, MD, PhD 02/08/2013, 9:31 AM

## 2013-02-08 NOTE — Patient Instructions (Signed)
F/u in 3 motnhs  Thank you very much Ms. Deborah Macdonald for allowing me to provide care for you today.  I appreciate your confidence in choosing our Gynecologic Oncology team.  If you have any questions about your visit today please call our office and we will get back to you as soon as possible.  [name]. Roddie Riegler MD., PhD Gynecologic Oncology

## 2013-02-09 ENCOUNTER — Telehealth: Payer: Self-pay | Admitting: Oncology

## 2013-02-09 ENCOUNTER — Encounter: Payer: Self-pay | Admitting: Oncology

## 2013-02-09 ENCOUNTER — Ambulatory Visit (HOSPITAL_BASED_OUTPATIENT_CLINIC_OR_DEPARTMENT_OTHER): Payer: Commercial Managed Care - PPO

## 2013-02-09 ENCOUNTER — Other Ambulatory Visit: Payer: Self-pay | Admitting: Oncology

## 2013-02-09 ENCOUNTER — Other Ambulatory Visit (HOSPITAL_BASED_OUTPATIENT_CLINIC_OR_DEPARTMENT_OTHER): Payer: Commercial Managed Care - PPO | Admitting: Lab

## 2013-02-09 VITALS — BP 162/93 | HR 92 | Temp 98.0°F | Resp 18

## 2013-02-09 DIAGNOSIS — Z5111 Encounter for antineoplastic chemotherapy: Secondary | ICD-10-CM

## 2013-02-09 DIAGNOSIS — C801 Malignant (primary) neoplasm, unspecified: Secondary | ICD-10-CM

## 2013-02-09 DIAGNOSIS — C779 Secondary and unspecified malignant neoplasm of lymph node, unspecified: Secondary | ICD-10-CM

## 2013-02-09 DIAGNOSIS — C561 Malignant neoplasm of right ovary: Secondary | ICD-10-CM

## 2013-02-09 DIAGNOSIS — C569 Malignant neoplasm of unspecified ovary: Secondary | ICD-10-CM

## 2013-02-09 DIAGNOSIS — R590 Localized enlarged lymph nodes: Secondary | ICD-10-CM

## 2013-02-09 LAB — CBC WITH DIFFERENTIAL/PLATELET
BASO%: 0.5 % (ref 0.0–2.0)
Eosinophils Absolute: 0.1 10*3/uL (ref 0.0–0.5)
MCHC: 33.3 g/dL (ref 31.5–36.0)
MONO#: 0.7 10*3/uL (ref 0.1–0.9)
NEUT#: 9.1 10*3/uL — ABNORMAL HIGH (ref 1.5–6.5)
RBC: 4.85 10*6/uL (ref 3.70–5.45)
RDW: 13 % (ref 11.2–14.5)
WBC: 11.5 10*3/uL — ABNORMAL HIGH (ref 3.9–10.3)

## 2013-02-09 LAB — COMPREHENSIVE METABOLIC PANEL (CC13)
ALT: 8 U/L (ref 0–55)
Albumin: 4.3 g/dL (ref 3.5–5.0)
CO2: 23 mEq/L (ref 22–29)
Calcium: 10.1 mg/dL (ref 8.4–10.4)
Chloride: 101 mEq/L (ref 98–107)
Glucose: 130 mg/dl — ABNORMAL HIGH (ref 70–99)
Sodium: 135 mEq/L — ABNORMAL LOW (ref 136–145)
Total Bilirubin: 0.34 mg/dL (ref 0.20–1.20)
Total Protein: 7.8 g/dL (ref 6.4–8.3)

## 2013-02-09 MED ORDER — SODIUM CHLORIDE 0.9 % IV SOLN
651.6000 mg | Freq: Once | INTRAVENOUS | Status: AC
  Administered 2013-02-09: 650 mg via INTRAVENOUS
  Filled 2013-02-09: qty 65

## 2013-02-09 MED ORDER — FAMOTIDINE IN NACL 20-0.9 MG/50ML-% IV SOLN
20.0000 mg | Freq: Once | INTRAVENOUS | Status: AC
  Administered 2013-02-09: 20 mg via INTRAVENOUS

## 2013-02-09 MED ORDER — SODIUM CHLORIDE 0.9 % IV SOLN
80.0000 mg/m2 | Freq: Once | INTRAVENOUS | Status: AC
  Administered 2013-02-09: 132 mg via INTRAVENOUS
  Filled 2013-02-09: qty 22

## 2013-02-09 MED ORDER — DIPHENHYDRAMINE HCL 50 MG/ML IJ SOLN
50.0000 mg | Freq: Once | INTRAMUSCULAR | Status: AC
  Administered 2013-02-09: 50 mg via INTRAVENOUS

## 2013-02-09 MED ORDER — DEXAMETHASONE SODIUM PHOSPHATE 4 MG/ML IJ SOLN
20.0000 mg | Freq: Once | INTRAMUSCULAR | Status: AC
  Administered 2013-02-09: 20 mg via INTRAVENOUS

## 2013-02-09 MED ORDER — SODIUM CHLORIDE 0.9 % IV SOLN
Freq: Once | INTRAVENOUS | Status: AC
  Administered 2013-02-09: 11:00:00 via INTRAVENOUS

## 2013-02-09 MED ORDER — ONDANSETRON 16 MG/50ML IVPB (CHCC)
16.0000 mg | Freq: Once | INTRAVENOUS | Status: AC
  Administered 2013-02-09: 16 mg via INTRAVENOUS

## 2013-02-09 NOTE — Patient Instructions (Signed)
[facility]  [phone] Discharge Instructions for Patients Receiving Chemotherapy  Today you received the following chemotherapy agents: Taxol, Carboplatin. To help prevent nausea and vomiting after your treatment, we encourage you to take your nausea medication.   If you develop nausea and vomiting that is not controlled by your nausea medication, call the clinic. [phone]   BELOW ARE SYMPTOMS THAT SHOULD BE REPORTED IMMEDIATELY:  *FEVER GREATER THAN 100.5 F  *CHILLS WITH OR WITHOUT FEVER  NAUSEA AND VOMITING THAT IS NOT CONTROLLED WITH YOUR NAUSEA MEDICATION  *UNUSUAL SHORTNESS OF BREATH  *UNUSUAL BRUISING OR BLEEDING  TENDERNESS IN MOUTH AND THROAT WITH OR WITHOUT PRESENCE OF ULCERS  *URINARY PROBLEMS  *BOWEL PROBLEMS  UNUSUAL RASH Items with * indicate a potential emergency and should be followed up as soon as possible.  One of the nurses will contact you 24 hours after your treatment. Please let the nurse know about any problems that you Strahle have experienced. Feel free to call the clinic you have any questions or concerns. The clinic phone number is [phone].

## 2013-02-10 ENCOUNTER — Telehealth: Payer: Self-pay | Admitting: *Deleted

## 2013-02-10 NOTE — Telephone Encounter (Signed)
Deborah Macdonald is doing well.  Reports she slept six hours which was oakay but didn't sleep as well last night.  Asked how long the steroid effects will last.  Took a xanax last night when she couldn't sleep.  Has also taken anti-emetic.due to feeling queasy.  Denies emesis and does not want to get sick.  Encouraged to drink water.  Denies any further questions.

## 2013-02-10 NOTE — Telephone Encounter (Signed)
Message copied by Augusto Garbe on Thu Todorov 1, 2014 11:58 AM ------      Message from: Barbara Cower      Created: Wed Feb 09, 2013 10:37 AM      Contact: [phone]       1st taxol, Ledell Noss            Dr. Gaylyn Rong ------

## 2013-02-15 NOTE — Patient Instructions (Addendum)
1.  Diagnosis:  Ovarian cancer.  2.  Treatment:  Weekly Taxol; once every 3 weeks Carboplatin. 3.  Goal:  About 4 cycles before repeat staging with scan to see if surgery is feasible.

## 2013-02-16 ENCOUNTER — Encounter: Payer: Self-pay | Admitting: *Deleted

## 2013-02-16 ENCOUNTER — Ambulatory Visit (HOSPITAL_BASED_OUTPATIENT_CLINIC_OR_DEPARTMENT_OTHER): Payer: Commercial Managed Care - PPO | Admitting: Oncology

## 2013-02-16 ENCOUNTER — Ambulatory Visit (HOSPITAL_BASED_OUTPATIENT_CLINIC_OR_DEPARTMENT_OTHER): Payer: Commercial Managed Care - PPO

## 2013-02-16 ENCOUNTER — Other Ambulatory Visit (HOSPITAL_BASED_OUTPATIENT_CLINIC_OR_DEPARTMENT_OTHER): Payer: Commercial Managed Care - PPO | Admitting: Lab

## 2013-02-16 ENCOUNTER — Telehealth: Payer: Self-pay | Admitting: Oncology

## 2013-02-16 VITALS — BP 134/83 | HR 82 | Temp 97.2°F | Resp 18 | Ht 66.0 in | Wt 129.1 lb

## 2013-02-16 DIAGNOSIS — C779 Secondary and unspecified malignant neoplasm of lymph node, unspecified: Secondary | ICD-10-CM

## 2013-02-16 DIAGNOSIS — G4700 Insomnia, unspecified: Secondary | ICD-10-CM

## 2013-02-16 DIAGNOSIS — C569 Malignant neoplasm of unspecified ovary: Secondary | ICD-10-CM

## 2013-02-16 DIAGNOSIS — C801 Malignant (primary) neoplasm, unspecified: Secondary | ICD-10-CM

## 2013-02-16 DIAGNOSIS — C561 Malignant neoplasm of right ovary: Secondary | ICD-10-CM

## 2013-02-16 DIAGNOSIS — Z5111 Encounter for antineoplastic chemotherapy: Secondary | ICD-10-CM

## 2013-02-16 DIAGNOSIS — F341 Dysthymic disorder: Secondary | ICD-10-CM

## 2013-02-16 LAB — CBC WITH DIFFERENTIAL/PLATELET
BASO%: 1.2 % (ref 0.0–2.0)
HCT: 37.8 % (ref 34.8–46.6)
LYMPH%: 20.7 % (ref 14.0–49.7)
MCH: 30.4 pg (ref 25.1–34.0)
MCHC: 33.6 g/dL (ref 31.5–36.0)
MONO#: 0.5 10*3/uL (ref 0.1–0.9)
NEUT%: 68.5 % (ref 38.4–76.8)
Platelets: 275 10*3/uL (ref 145–400)
WBC: 5.7 10*3/uL (ref 3.9–10.3)

## 2013-02-16 LAB — COMPREHENSIVE METABOLIC PANEL (CC13)
ALT: 9 U/L (ref 0–55)
BUN: 16.5 mg/dL (ref 7.0–26.0)
CO2: 25 mEq/L (ref 22–29)
Creatinine: 0.6 mg/dL (ref 0.6–1.1)
Total Bilirubin: <0.2 mg/dL (ref 0.20–1.20)

## 2013-02-16 MED ORDER — ONDANSETRON 8 MG/50ML IVPB (CHCC)
8.0000 mg | Freq: Once | INTRAVENOUS | Status: AC
  Administered 2013-02-16: 8 mg via INTRAVENOUS

## 2013-02-16 MED ORDER — ZOLPIDEM TARTRATE 5 MG PO TABS: 5.0000 mg | ORAL_TABLET | Freq: Every evening | ORAL | Status: DC | PRN

## 2013-02-16 MED ORDER — SODIUM CHLORIDE 0.9 % IV SOLN
Freq: Once | INTRAVENOUS | Status: AC
Start: 2013-02-16 — End: 2013-02-16
  Administered 2013-02-16: 12:00:00 via INTRAVENOUS

## 2013-02-16 MED ORDER — DEXAMETHASONE SODIUM PHOSPHATE 20 MG/5ML IJ SOLN
20.0000 mg | Freq: Once | INTRAMUSCULAR | Status: AC
  Administered 2013-02-16: 20 mg via INTRAVENOUS

## 2013-02-16 MED ORDER — DIPHENHYDRAMINE HCL 50 MG/ML IJ SOLN
50.0000 mg | Freq: Once | INTRAMUSCULAR | Status: AC
  Administered 2013-02-16: 50 mg via INTRAVENOUS

## 2013-02-16 MED ORDER — FAMOTIDINE IN NACL 20-0.9 MG/50ML-% IV SOLN
20.0000 mg | Freq: Once | INTRAVENOUS | Status: AC
  Administered 2013-02-16: 20 mg via INTRAVENOUS

## 2013-02-16 MED ORDER — SODIUM CHLORIDE 0.9 % IV SOLN
80.0000 mg/m2 | Freq: Once | INTRAVENOUS | Status: AC
  Administered 2013-02-16: 132 mg via INTRAVENOUS
  Filled 2013-02-16: qty 22

## 2013-02-16 NOTE — Progress Notes (Signed)
Clinical Social Work received referral from MD for counseling services.  CSW met with patient, patient's spouse, and patient's daughter prior to infusion today.  Mrs. Olejnik states she is interested in counseling to help cope with anxiety.  She states she is unsure whether she'd like to receive counseling in the community (she states, "I don't want to be here if I don't have to") or if she would like to see a counseling intern in the [facility] support center.  The patient requested time to process her options and contact CSW if she would like to set up services at [facility].  Kathrin Penner, MSW, LCSW Clinical Social Worker [facility] [phone]

## 2013-02-16 NOTE — Telephone Encounter (Signed)
gv and printed appt sched and avs for pt....s.w. MB to add tx...pt aware

## 2013-02-16 NOTE — Progress Notes (Signed)
[facility]  Telephone:(336) [phone] Fax:(336) [phone]   OFFICE PROGRESS NOTE   Cc:  Primus Bravo, NP  DIAGNOSIS:  Metastatic ovarian cancer.   PAST THERAPY:  Biopsy only.   CURRENT THERAPY: started neoadjuvant Carboplatin d1; Taxol d1,8,15 every 3 weeks on 02/09/2013.  Goal to is to be seen by Dr. [name] [facility]) after about 4 cycles of chemo to see if she is a resectable candidate.   INTERVAL HISTORY: Deborah Macdonald 56 y.o. female returns for regular follow up with her husband and her daughter.  She did relatively well with chemo las week.  She had mild fatigue.  However, she was still independent of all activities of daily living.  She had insomnia for a few days after chemo.  She had been taking Xanax but it is not working for her.  She still feels somewhat nervous about this diagnosis; she is willing to see a therapist now.  She has constipation.  She took OTC med for constipation without improvement.  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  She has mild fullness in the pelvis without pain, bleeding.   Patient denies fever, anorexia, weight loss, headache, visual changes, confusion, drenching night sweats, palpable lymph node swelling, mucositis, odynophagia, dysphagia, nausea vomiting, jaundice, chest pain, palpitation, shortness of breath, dyspnea on exertion, productive cough, gum bleeding, epistaxis, hematemesis, hemoptysis, abdominal swelling, early satiety, melena, hematochezia, hematuria, skin rash, spontaneous bleeding, joint swelling, joint pain, heat or cold intolerance, bowel bladder incontinence, back pain, focal motor weakness.    Past Medical History  Diagnosis Date  . PONV (postoperative nausea and vomiting)   . MVP (mitral valve prolapse)   . Hypertension   . Anxiety   . Shortness of breath     at times "anxiety"  . Ovarian cancer on right   . Regional lymph node  metastasis present     Past Surgical History  Procedure Laterality Date  . Wisdom tooth extraction    . Lymph node biopsy Left 01/21/2013    Procedure: EXCISIONAL BIOPSY OF THE NECK;  Surgeon: Serena Colonel, MD;  Location: [facility] OR;  Service: ENT;  Laterality: Left;  . Leep      20 year ago    Current Outpatient Prescriptions  Medication Sig Dispense Refill  . ALPRAZolam (XANAX) 0.5 MG tablet Take 0.5 mg by mouth 3 (three) times daily.      . Aspirin-Salicylamide-Caffeine (BC HEADACHE POWDER PO) Take 1 packet by mouth every 6 (six) hours as needed (headache).      . estradiol (MINIVELLE) 0.075 MG/24HR Place 1 patch onto the skin 2 (two) times a week.      . fish oil-omega-3 fatty acids 1000 MG capsule Take 1 g by mouth daily.      Marland Kitchen ibuprofen (ADVIL,MOTRIN) 200 MG tablet Take 600 mg by mouth every 6 (six) hours as needed for pain or headache.      . ondansetron (ZOFRAN) 8 MG tablet Take 1 tablet (8 mg total) by mouth 2 (two) times daily. Take two times a day starting the day after chemo for 3 days. Then take two times a day as needed for nausea or vomiting.  30 tablet  1  . prochlorperazine (COMPAZINE) 10 MG tablet Take 1 tablet (10 mg total) by mouth every 6 (six) hours as needed (Nausea or vomiting).  30 tablet  1  . prochlorperazine (COMPAZINE) 25 MG suppository Place 1 suppository (  25 mg total) rectally every 12 (twelve) hours as needed for nausea.  12 suppository  3  . Progesterone Micronized (PROMETRIUM PO) Take by mouth. Three times yearly      . triamterene-hydrochlorothiazide (MAXZIDE-25) 37.5-25 MG per tablet Take 1 tablet by mouth daily.      Marland Kitchen zolpidem (AMBIEN) 5 MG tablet Take 1 tablet (5 mg total) by mouth at bedtime as needed for sleep.  30 tablet  3   No current facility-administered medications for this visit.    ALLERGIES:  is allergic to codeine.  REVIEW OF SYSTEMS:  The rest of the 14-point review of system was negative.   Filed Vitals:   02/16/13 0959  BP: 134/83    Pulse: 82  Temp: 97.2 F (36.2 C)  Resp: 18   Wt Readings from Last 3 Encounters:  02/16/13 129 lb 1.6 oz (58.559 kg)  02/08/13 128 lb 8 oz (58.287 kg)  02/04/13 129 lb 8 oz (58.741 kg)   ECOG Performance status: 0  PHYSICAL EXAMINATION:   General:  well-nourished woman,  in no acute distress.  Eyes:  no scleral icterus.  ENT:  There were no oropharyngeal lesions.  Neck was without thyromegaly.  Lymphatics:  Negative cervical, supraclavicular or axillary adenopathy.  Respiratory: lungs were clear bilaterally without wheezing or crackles.  Cardiovascular:  Regular rate and rhythm, S1/S2, without murmur, rub or gallop.  There was no pedal edema.  GI:  abdomen was soft, flat, nontender, nondistended, without organomegaly.  Muscoloskeletal:  no spinal tenderness of palpation of vertebral spine.  Skin exam was without echymosis, petichae.  Neuro exam was nonfocal.  Patient was able to get on and off exam table without assistance.  Gait was normal.  Patient was alert and oriented.  Attention was good.   Language was appropriate.  Mood was normal without depression.  Speech was not pressured.  Thought content was not tangential.     LABORATORY/RADIOLOGY DATA:  Lab Results  Component Value Date   WBC 5.7 02/16/2013   HGB 12.7 02/16/2013   HCT 37.8 02/16/2013   PLT 275 02/16/2013   GLUCOSE 130* 02/09/2013   ALKPHOS 206* 02/09/2013   ALT 8 02/09/2013   AST 14 02/09/2013   NA 135* 02/09/2013   K 3.9 02/09/2013   CL 101 02/09/2013   CREATININE 0.7 02/09/2013   BUN 10.3 02/09/2013   CO2 23 02/09/2013     ASSESSMENT AND PLAN:   1.  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   - s/p cycle#1,day #1 of Carboplatin/Taxol last week with grade 1 insomnia, grade 1 fatigue; grade 2 constipation.  She does not have dose-limiting toxicity.  I advised her to continue with chemo today without dose modification.  - Plan is for about 4 cycles before restaging but sooner if  concerning symptoms.   2.  Anxiety/Depression:  I advised her to see a therapist.  She was opened to this recommendation.  I referred her to SW for recommendations.   3.  Insomnia:  Due to steroid/chemo.  I prescribed Ambien 5mg  PO qhs prn.  I advised her to stop this if her tremor worsens.   4.  Constipation: I advised her to take Colace 100mg  PO BID and reserve laxative prn obstipation.   I informed her and her family that I'm leaving the practice in 2 months.  I will continue to see her until mid July 2014.  However, the Cancer Center will arrange for her to  see a new provider upon my departure.     The length of time of the face-to-face encounter was 25 minutes. More than 50% of time was spent counseling and coordination of care.     Amiah Frohlich T. Gaylyn Rong, M.D.

## 2013-02-16 NOTE — Patient Instructions (Addendum)
[facility] Discharge Instructions for Patients Receiving Chemotherapy  Today you received the following chemotherapy agents:  taxol  To help prevent nausea and vomiting after your treatment, we encourage you to take your nausea medication.  Take it as often as prescribed.     If you develop nausea and vomiting that is not controlled by your nausea medication, call the clinic. If it is after clinic hours your family physician or the after hours number for the clinic or go to the Emergency Department.   BELOW ARE SYMPTOMS THAT SHOULD BE REPORTED IMMEDIATELY:  *FEVER GREATER THAN 100.5 F  *CHILLS WITH OR WITHOUT FEVER  NAUSEA AND VOMITING THAT IS NOT CONTROLLED WITH YOUR NAUSEA MEDICATION  *UNUSUAL SHORTNESS OF BREATH  *UNUSUAL BRUISING OR BLEEDING  TENDERNESS IN MOUTH AND THROAT WITH OR WITHOUT PRESENCE OF ULCERS  *URINARY PROBLEMS  *BOWEL PROBLEMS  UNUSUAL RASH Items with * indicate a potential emergency and should be followed up as soon as possible.  One of the nurses will contact you 24 hours after your treatment. Please let the nurse know about any problems that you [place] have experienced. Feel free to call the clinic you have any questions or concerns. The clinic phone number is [phone].   I have been informed and understand all the instructions given to me. I know to contact the clinic, my physician, or go to the Emergency Department if any problems should occur. I do not have any questions at this time, but understand that I [place] call the clinic during office hours   should I have any questions or need assistance in obtaining follow up care.    __________________________________________  _____________  __________ Signature of Patient or Authorized Representative            Date                   Time    __________________________________________ Nurse's Signature   

## 2013-02-23 ENCOUNTER — Other Ambulatory Visit (HOSPITAL_BASED_OUTPATIENT_CLINIC_OR_DEPARTMENT_OTHER): Payer: Commercial Managed Care - PPO | Admitting: Lab

## 2013-02-23 ENCOUNTER — Encounter: Payer: Self-pay | Admitting: Oncology

## 2013-02-23 ENCOUNTER — Ambulatory Visit (HOSPITAL_BASED_OUTPATIENT_CLINIC_OR_DEPARTMENT_OTHER): Payer: Commercial Managed Care - PPO

## 2013-02-23 ENCOUNTER — Ambulatory Visit (HOSPITAL_BASED_OUTPATIENT_CLINIC_OR_DEPARTMENT_OTHER): Payer: Commercial Managed Care - PPO | Admitting: Oncology

## 2013-02-23 VITALS — BP 161/88 | HR 95 | Temp 97.4°F | Resp 18 | Ht 66.0 in | Wt 128.4 lb

## 2013-02-23 DIAGNOSIS — Z5111 Encounter for antineoplastic chemotherapy: Secondary | ICD-10-CM

## 2013-02-23 DIAGNOSIS — C779 Secondary and unspecified malignant neoplasm of lymph node, unspecified: Secondary | ICD-10-CM

## 2013-02-23 DIAGNOSIS — G4700 Insomnia, unspecified: Secondary | ICD-10-CM

## 2013-02-23 DIAGNOSIS — C569 Malignant neoplasm of unspecified ovary: Secondary | ICD-10-CM

## 2013-02-23 DIAGNOSIS — C801 Malignant (primary) neoplasm, unspecified: Secondary | ICD-10-CM

## 2013-02-23 DIAGNOSIS — C561 Malignant neoplasm of right ovary: Secondary | ICD-10-CM

## 2013-02-23 DIAGNOSIS — F341 Dysthymic disorder: Secondary | ICD-10-CM

## 2013-02-23 LAB — CBC WITH DIFFERENTIAL/PLATELET
Basophils Absolute: 0 10*3/uL (ref 0.0–0.1)
EOS%: 0.5 % (ref 0.0–7.0)
HCT: 38.1 % (ref 34.8–46.6)
HGB: 13 g/dL (ref 11.6–15.9)
MCH: 30.8 pg (ref 25.1–34.0)
MCV: 90.3 fL (ref 79.5–101.0)
MONO%: 10.4 % (ref 0.0–14.0)
NEUT%: 66 % (ref 38.4–76.8)
Platelets: 268 10*3/uL (ref 145–400)

## 2013-02-23 MED ORDER — SODIUM CHLORIDE 0.9 % IV SOLN
80.0000 mg/m2 | Freq: Once | INTRAVENOUS | Status: AC
  Administered 2013-02-23: 132 mg via INTRAVENOUS
  Filled 2013-02-23: qty 22

## 2013-02-23 MED ORDER — FAMOTIDINE IN NACL 20-0.9 MG/50ML-% IV SOLN
20.0000 mg | Freq: Once | INTRAVENOUS | Status: AC
  Administered 2013-02-23: 20 mg via INTRAVENOUS

## 2013-02-23 MED ORDER — DEXAMETHASONE SODIUM PHOSPHATE 20 MG/5ML IJ SOLN
20.0000 mg | Freq: Once | INTRAMUSCULAR | Status: AC
  Administered 2013-02-23: 20 mg via INTRAVENOUS

## 2013-02-23 MED ORDER — ONDANSETRON 8 MG/50ML IVPB (CHCC)
8.0000 mg | Freq: Once | INTRAVENOUS | Status: AC
  Administered 2013-02-23: 8 mg via INTRAVENOUS

## 2013-02-23 MED ORDER — SODIUM CHLORIDE 0.9 % IV SOLN
Freq: Once | INTRAVENOUS | Status: AC
  Administered 2013-02-23: 13:00:00 via INTRAVENOUS

## 2013-02-23 MED ORDER — DIPHENHYDRAMINE HCL 50 MG/ML IJ SOLN
50.0000 mg | Freq: Once | INTRAMUSCULAR | Status: AC
  Administered 2013-02-23: 50 mg via INTRAVENOUS

## 2013-02-23 MED ORDER — ESCITALOPRAM OXALATE 5 MG PO TABS: 5.0000 mg | ORAL_TABLET | Freq: Every day | ORAL | Status: DC

## 2013-02-23 NOTE — Progress Notes (Signed)
[facility]  Telephone:(336) [phone] Fax:(336) [phone]   OFFICE PROGRESS NOTE   Cc:  Primus Bravo, NP  DIAGNOSIS:  Metastatic ovarian cancer.   PAST THERAPY:  Biopsy only.   CURRENT THERAPY: started neoadjuvant Carboplatin d1; Taxol d1,8,15 every 3 weeks on 02/09/2013.  Goal to is to be seen by Dr. [name] [facility]) after about 4 cycles of chemo to see if she is a resectable candidate.   INTERVAL HISTORY: Deborah Macdonald 56 y.o. female returns for regular follow up by herself. She continues to do well with chemotherapy.  She had mild fatigue.  However, she was still independent of all activities of daily living.  She had insomnia for a few days after chemo but this is better with Ambien. She still feels somewhat nervous about this diagnosis; she is she is seeing a therapist this afternoon.  Reports symptoms of depression and would like to begin antidepressant. Constipation is now resolved with routine stool softeners. 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  She has mild fullness in the pelvis without pain, bleeding.   Patient denies fever, anorexia, weight loss, headache, visual changes, confusion, drenching night sweats, palpable lymph node swelling, mucositis, odynophagia, dysphagia, nausea vomiting, jaundice, chest pain, palpitation, shortness of breath, dyspnea on exertion, productive cough, gum bleeding, epistaxis, hematemesis, hemoptysis, abdominal swelling, early satiety, melena, hematochezia, hematuria, skin rash, spontaneous bleeding, joint swelling, joint pain, heat or cold intolerance, bowel bladder incontinence, back pain, focal motor weakness.    Past Medical History  Diagnosis Date  . PONV (postoperative nausea and vomiting)   . MVP (mitral valve prolapse)   . Hypertension   . Anxiety   . Shortness of breath     at times "anxiety"  . Ovarian cancer on right   . Regional lymph node  metastasis present     Past Surgical History  Procedure Laterality Date  . Wisdom tooth extraction    . Lymph node biopsy Left 01/21/2013    Procedure: EXCISIONAL BIOPSY OF THE NECK;  Surgeon: Serena Colonel, MD;  Location: [facility] OR;  Service: ENT;  Laterality: Left;  . Leep      20 year ago    Current Outpatient Prescriptions  Medication Sig Dispense Refill  . ALPRAZolam (XANAX) 0.5 MG tablet Take 0.5 mg by mouth 3 (three) times daily.      . Aspirin-Salicylamide-Caffeine (BC HEADACHE POWDER PO) Take 1 packet by mouth every 6 (six) hours as needed (headache).      . escitalopram (LEXAPRO) 5 MG tablet Take 1 tablet (5 mg total) by mouth daily.  30 tablet  3  . fish oil-omega-3 fatty acids 1000 MG capsule Take 1 g by mouth daily.      Marland Kitchen ibuprofen (ADVIL,MOTRIN) 200 MG tablet Take 600 mg by mouth every 6 (six) hours as needed for pain or headache.      . ondansetron (ZOFRAN) 8 MG tablet Take 1 tablet (8 mg total) by mouth 2 (two) times daily. Take two times a day starting the day after chemo for 3 days. Then take two times a day as needed for nausea or vomiting.  30 tablet  1  . prochlorperazine (COMPAZINE) 10 MG tablet Take 1 tablet (10 mg total) by mouth every 6 (six) hours as needed (Nausea or vomiting).  30 tablet  1  . triamterene-hydrochlorothiazide (MAXZIDE-25) 37.5-25 MG per tablet Take 1 tablet by mouth daily.      Marland Kitchen  zolpidem (AMBIEN) 5 MG tablet Take 1 tablet (5 mg total) by mouth at bedtime as needed for sleep.  30 tablet  3   No current facility-administered medications for this visit.    ALLERGIES:  is allergic to codeine.  REVIEW OF SYSTEMS:  The rest of the 14-point review of system was negative.   Filed Vitals:   02/23/13 1138  BP: 161/88  Pulse: 95  Temp: 97.4 F (36.3 C)  Resp: 18   Wt Readings from Last 3 Encounters:  02/23/13 128 lb 6.4 oz (58.242 kg)  02/16/13 129 lb 1.6 oz (58.559 kg)  02/08/13 128 lb 8 oz (58.287 kg)   ECOG Performance status: 0  PHYSICAL  EXAMINATION:   General:  well-nourished woman,  in no acute distress.  Eyes:  no scleral icterus.  ENT:  There were no oropharyngeal lesions.  Neck was without thyromegaly.  Lymphatics:  Negative cervical, supraclavicular or axillary adenopathy.  Respiratory: lungs were clear bilaterally without wheezing or crackles.  Cardiovascular:  Regular rate and rhythm, S1/S2, without murmur, rub or gallop.  There was no pedal edema.  GI:  abdomen was soft, flat, nontender, nondistended, without organomegaly.  Muscoloskeletal:  no spinal tenderness of palpation of vertebral spine.  Skin exam was without echymosis, petichae.  Neuro exam was nonfocal.  Patient was able to get on and off exam table without assistance.  Gait was normal.  Patient was alert and oriented.  Attention was good.   Language was appropriate.  Mood was normal without depression.  Speech was not pressured.  Thought content was not tangential.     LABORATORY/RADIOLOGY DATA:  Lab Results  Component Value Date   WBC 7.3 02/23/2013   HGB 13.0 02/23/2013   HCT 38.1 02/23/2013   PLT 268 02/23/2013   GLUCOSE 96 02/16/2013   ALKPHOS 134 02/16/2013   ALT 9 02/16/2013   AST 12 02/16/2013   NA 137 02/16/2013   K 4.1 02/16/2013   CL 104 02/16/2013   CREATININE 0.6 02/16/2013   BUN 16.5 02/16/2013   CO2 25 02/16/2013     ASSESSMENT AND PLAN:   1.  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   - s/p cycle#1,day #8 of Carboplatin/Taxol grade 1 insomnia, grade 1 fatigue; grade 2 constipation.  She does not have dose-limiting toxicity.  I advised her to continue with chemo today without dose modification.  - Plan is for about 4 cycles before restaging but sooner if concerning symptoms.   2.  Anxiety/Depression: She will see a therapist this afternoon. I have prescribed Lexapro 5 mg daily. I have discussed the risks and benefits of this medication and she is in agreement to using this.   3.  Insomnia:  Due to  steroid/chemo.  Using Ambien when necessary.   4.  Constipation: Improved with the use of Colace 100mg  PO BID.   5. Followup: She will followup in one week.   The length of time of the face-to-face encounter was 15 minutes. More than 50% of time was spent counseling and coordination of care.

## 2013-02-23 NOTE — Patient Instructions (Addendum)
[facility] Discharge Instructions for Patients Receiving Chemotherapy  Today you received the following chemotherapy agents :taxcol To help prevent nausea and vomiting after your treatment, we encourage you to take your nausea medicationIf you develop nausea and vomiting that is not controlled by your nausea medication, call the clinic. If it is after clinic hours your family physician or the after hours number for the clinic or go to the Emergency Department.   BELOW ARE SYMPTOMS THAT SHOULD BE REPORTED IMMEDIATELY:  *FEVER GREATER THAN 100.5 F  *CHILLS WITH OR WITHOUT FEVER  NAUSEA AND VOMITING THAT IS NOT CONTROLLED WITH YOUR NAUSEA MEDICATION  *UNUSUAL SHORTNESS OF BREATH  *UNUSUAL BRUISING OR BLEEDING  TENDERNESS IN MOUTH AND THROAT WITH OR WITHOUT PRESENCE OF ULCERS  *URINARY PROBLEMS  *BOWEL PROBLEMS  UNUSUAL RASH Items with * indicate a potential emergency and should be followed up as soon as possible.   Please let the nurse know about any problems that you [place] have experienced. Feel free to call the clinic you have any questions or concerns. The clinic phone number is [phone].   I have been informed and understand all the instructions given to me. I know to contact the clinic, my physician, or go to the Emergency Department if any problems should occur. I do not have any questions at this time, but understand that I [place] call the clinic during office hours   should I have any questions or need assistance in obtaining follow up care.    __________________________________________  _____________  __________ Signature of Patient or Authorized Representative            Date                   Time    __________________________________________ Nurse's Signature

## 2013-03-01 NOTE — Patient Instructions (Signed)
1.  Diagnosis:  Metastatic ovarian cancer.  2.  Treatment:  Weekly Taxol; once every 3 weeks Carboplatin.  3.  Repeat scan after about 4 cycles total to assess response.  However, scan can be repeated sooner if not tolerating chemo well.

## 2013-03-02 ENCOUNTER — Telehealth: Payer: Self-pay | Admitting: *Deleted

## 2013-03-02 ENCOUNTER — Ambulatory Visit (HOSPITAL_BASED_OUTPATIENT_CLINIC_OR_DEPARTMENT_OTHER): Payer: Commercial Managed Care - PPO | Admitting: Oncology

## 2013-03-02 ENCOUNTER — Other Ambulatory Visit (HOSPITAL_BASED_OUTPATIENT_CLINIC_OR_DEPARTMENT_OTHER): Payer: Commercial Managed Care - PPO | Admitting: Lab

## 2013-03-02 ENCOUNTER — Ambulatory Visit (HOSPITAL_BASED_OUTPATIENT_CLINIC_OR_DEPARTMENT_OTHER): Payer: Commercial Managed Care - PPO

## 2013-03-02 ENCOUNTER — Other Ambulatory Visit: Payer: Commercial Managed Care - PPO | Admitting: Lab

## 2013-03-02 ENCOUNTER — Ambulatory Visit: Payer: Commercial Managed Care - PPO | Admitting: Oncology

## 2013-03-02 ENCOUNTER — Other Ambulatory Visit: Payer: Self-pay | Admitting: Radiology

## 2013-03-02 ENCOUNTER — Telehealth: Payer: Self-pay | Admitting: Oncology

## 2013-03-02 VITALS — BP 148/78 | HR 81 | Temp 97.9°F | Resp 18 | Ht 66.0 in | Wt 129.8 lb

## 2013-03-02 DIAGNOSIS — F341 Dysthymic disorder: Secondary | ICD-10-CM

## 2013-03-02 DIAGNOSIS — C561 Malignant neoplasm of right ovary: Secondary | ICD-10-CM

## 2013-03-02 DIAGNOSIS — C801 Malignant (primary) neoplasm, unspecified: Secondary | ICD-10-CM

## 2013-03-02 DIAGNOSIS — C779 Secondary and unspecified malignant neoplasm of lymph node, unspecified: Secondary | ICD-10-CM

## 2013-03-02 DIAGNOSIS — G4700 Insomnia, unspecified: Secondary | ICD-10-CM

## 2013-03-02 DIAGNOSIS — C569 Malignant neoplasm of unspecified ovary: Secondary | ICD-10-CM

## 2013-03-02 DIAGNOSIS — Z5111 Encounter for antineoplastic chemotherapy: Secondary | ICD-10-CM

## 2013-03-02 LAB — CBC WITH DIFFERENTIAL/PLATELET
Basophils Absolute: 0.1 10*3/uL (ref 0.0–0.1)
EOS%: 0.6 % (ref 0.0–7.0)
HCT: 39.5 % (ref 34.8–46.6)
HGB: 13 g/dL (ref 11.6–15.9)
LYMPH%: 28.8 % (ref 14.0–49.7)
MCH: 30.5 pg (ref 25.1–34.0)
MCV: 92.7 fL (ref 79.5–101.0)
MONO%: 8.8 % (ref 0.0–14.0)
NEUT%: 60.2 % (ref 38.4–76.8)
Platelets: 188 10*3/uL (ref 145–400)
lymph#: 1.8 10*3/uL (ref 0.9–3.3)

## 2013-03-02 LAB — COMPREHENSIVE METABOLIC PANEL (CC13)
Albumin: 3.2 g/dL — ABNORMAL LOW (ref 3.5–5.0)
Alkaline Phosphatase: 69 U/L (ref 40–150)
BUN: 10.4 mg/dL (ref 7.0–26.0)
Glucose: 114 mg/dl — ABNORMAL HIGH (ref 70–99)
Potassium: 4.2 mEq/L (ref 3.5–5.1)

## 2013-03-02 MED ORDER — DIPHENHYDRAMINE HCL 50 MG/ML IJ SOLN
50.0000 mg | Freq: Once | INTRAMUSCULAR | Status: AC
  Administered 2013-03-02: 50 mg via INTRAVENOUS

## 2013-03-02 MED ORDER — SODIUM CHLORIDE 0.9 % IV SOLN
80.0000 mg/m2 | Freq: Once | INTRAVENOUS | Status: AC
  Administered 2013-03-02: 132 mg via INTRAVENOUS
  Filled 2013-03-02: qty 22

## 2013-03-02 MED ORDER — SODIUM CHLORIDE 0.9 % IV SOLN
651.6000 mg | Freq: Once | INTRAVENOUS | Status: AC
  Administered 2013-03-02: 650 mg via INTRAVENOUS
  Filled 2013-03-02: qty 65

## 2013-03-02 MED ORDER — DEXAMETHASONE SODIUM PHOSPHATE 20 MG/5ML IJ SOLN
20.0000 mg | Freq: Once | INTRAMUSCULAR | Status: AC
  Administered 2013-03-02: 20 mg via INTRAVENOUS

## 2013-03-02 MED ORDER — LIDOCAINE-PRILOCAINE 2.5-2.5 % EX CREA: TOPICAL_CREAM | CUTANEOUS | Status: AC | PRN

## 2013-03-02 MED ORDER — ONDANSETRON 16 MG/50ML IVPB (CHCC)
16.0000 mg | Freq: Once | INTRAVENOUS | Status: AC
  Administered 2013-03-02 (×2): 16 mg via INTRAVENOUS

## 2013-03-02 MED ORDER — FAMOTIDINE IN NACL 20-0.9 MG/50ML-% IV SOLN
20.0000 mg | Freq: Once | INTRAVENOUS | Status: AC
  Administered 2013-03-02: 20 mg via INTRAVENOUS

## 2013-03-02 MED ORDER — SODIUM CHLORIDE 0.9 % IV SOLN
Freq: Once | INTRAVENOUS | Status: AC
  Administered 2013-03-02: 10:00:00 via INTRAVENOUS

## 2013-03-02 NOTE — Telephone Encounter (Signed)
Pt asking for "numbing cream" to use for new porta cath which is scheduled for this Friday 5/23.   Rx sent to [facility].  Instructions given on how to use the cream and to call back if any questions.  She verbalized understanding.

## 2013-03-02 NOTE — Patient Instructions (Signed)
Patient aware of next appointment; discharged home with no complaints. 

## 2013-03-02 NOTE — Telephone Encounter (Signed)
Per staff phone call and POF I have schedueld appts.  [name]  

## 2013-03-02 NOTE — Progress Notes (Signed)
[facility]  Telephone:(336) [phone] Fax:(336) [phone]   OFFICE PROGRESS NOTE   Cc:  Primus Bravo, NP  DIAGNOSIS:  Metastatic ovarian cancer.   PAST THERAPY:  Biopsy only.   CURRENT THERAPY: started neoadjuvant Carboplatin d1; Taxol d1,8,15 every 3 weeks on 02/09/2013.  Goal to is to be seen by Dr. [name] [facility]) after about 4 cycles of chemo to see if she is a resectable candidate.   INTERVAL HISTORY: Deborah Macdonald 57 y.o. female returns for regular follow up by herself.  She reports feeling well.  She has not had side effect from chemo. She denied fever, SOB, nausea/vomiting, abd pain, bleeding.  She has mild, transient neuropathy lasting for a few minutes after chemo last week.  She has no problem using her phone, getting dressed, writing.  Her mood is stable.  She is having good rapport with the therapist.  The rest of the 14-point review of system was negative.    Past Medical History  Diagnosis Date  . PONV (postoperative nausea and vomiting)   . MVP (mitral valve prolapse)   . Hypertension   . Anxiety   . Shortness of breath     at times "anxiety"  . Ovarian cancer on right   . Regional lymph node metastasis present     Past Surgical History  Procedure Laterality Date  . Wisdom tooth extraction    . Lymph node biopsy Left 01/21/2013    Procedure: EXCISIONAL BIOPSY OF THE NECK;  Surgeon: Serena Colonel, MD;  Location: [facility] OR;  Service: ENT;  Laterality: Left;  . Leep      20 year ago    Current Outpatient Prescriptions  Medication Sig Dispense Refill  . ALPRAZolam (XANAX) 0.5 MG tablet Take 0.5 mg by mouth 3 (three) times daily.      . Aspirin-Salicylamide-Caffeine (BC HEADACHE POWDER PO) Take 1 packet by mouth every 6 (six) hours as needed (headache).      . fish oil-omega-3 fatty acids 1000 MG capsule Take 1 g by mouth daily.      Marland Kitchen ibuprofen (ADVIL,MOTRIN) 200 MG tablet Take 600 mg by mouth every 6 (six) hours as needed for pain or headache.        . ondansetron (ZOFRAN) 8 MG tablet Take 1 tablet (8 mg total) by mouth 2 (two) times daily. Take two times a day starting the day after chemo for 3 days. Then take two times a day as needed for nausea or vomiting.  30 tablet  1  . prochlorperazine (COMPAZINE) 10 MG tablet Take 1 tablet (10 mg total) by mouth every 6 (six) hours as needed (Nausea or vomiting).  30 tablet  1  . triamterene-hydrochlorothiazide (MAXZIDE-25) 37.5-25 MG per tablet Take 1 tablet by mouth daily.      Marland Kitchen zolpidem (AMBIEN) 5 MG tablet Take 1 tablet (5 mg total) by mouth at bedtime as needed for sleep.  30 tablet  3  . escitalopram (LEXAPRO) 5 MG tablet Take 1 tablet (5 mg total) by mouth daily.  30 tablet  3   No current facility-administered medications for this visit.    ALLERGIES:  is allergic to codeine.  REVIEW OF SYSTEMS:  The rest of the 14-point review of system was negative.   Filed Vitals:   03/02/13 0831  BP: 148/78  Pulse: 81  Temp: 97.9 F (36.6 C)  Resp: 18   Wt Readings from Last 3 Encounters:  03/02/13 129 lb 12.8 oz (58.877 kg)  02/23/13  128 lb 6.4 oz (58.242 kg)  02/16/13 129 lb 1.6 oz (58.559 kg)   ECOG Performance status: 0  PHYSICAL EXAMINATION:   General:  well-nourished woman,  in no acute distress.  Eyes:  no scleral icterus.  ENT:  There were no oropharyngeal lesions.  Neck was without thyromegaly.  Lymphatics:  Negative cervical, supraclavicular or axillary adenopathy.  Respiratory: lungs were clear bilaterally without wheezing or crackles.  Cardiovascular:  Regular rate and rhythm, S1/S2, without murmur, rub or gallop.  There was no pedal edema.  GI:  abdomen was soft, flat, nontender, nondistended, without organomegaly.  Muscoloskeletal:  no spinal tenderness of palpation of vertebral spine.  Skin exam was without echymosis, petichae.  Neuro exam was nonfocal.  Patient was able to get on and off exam table without assistance.  Gait was normal.  Patient was alert and oriented.   Attention was good.   Language was appropriate.  Mood was normal without depression.  Speech was not pressured.  Thought content was not tangential.     LABORATORY/RADIOLOGY DATA:  Lab Results  Component Value Date   WBC 6.4 03/02/2013   HGB 13.0 03/02/2013   HCT 39.5 03/02/2013   PLT 188 03/02/2013   GLUCOSE 114* 03/02/2013   ALKPHOS 69 03/02/2013   ALT 9 03/02/2013   AST 11 03/02/2013   NA 140 03/02/2013   K 4.2 03/02/2013   CL 111* 03/02/2013   CREATININE 0.6 03/02/2013   BUN 10.4 03/02/2013   CO2 22 03/02/2013     ASSESSMENT AND PLAN:   1.  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   - s/p cycle#1 of chemo.  She did well with no dose limiting toxicity.  She had grade 1 neuropathy. - I recommended to proceed with the 2nd cycle of chemo Carboplatin/Taxol without dose modification. - She inquired about portacath since her IV placement has been difficult.  I referred her to IR.   2.  Anxiety/Depression:  Mood improved with therapy.   3.  Insomnia:  Due to steroid/chemo.  I prescribed Ambien 5mg  PO qhs prn.   4.  Follow up:  Weekly lab and chemo Taxol (carboplatin is only q3wk).  Return visit in about 3 weeks for the next cycle of chemo.     The length of time of the face-to-face encounter was 25 minutes. More than 50% of time was spent counseling and coordination of care.     Huan T. Gaylyn Rong, M.D.

## 2013-03-02 NOTE — Telephone Encounter (Signed)
Per staff message and POF I have scheduled appts.  [name]  

## 2013-03-02 NOTE — Telephone Encounter (Signed)
Gave pt appt for lab, MD, chemo and IR appt for porta cath for Deborah Macdonald and June 2014

## 2013-03-03 ENCOUNTER — Encounter (HOSPITAL_COMMUNITY): Payer: Self-pay | Admitting: Pharmacy Technician

## 2013-03-04 ENCOUNTER — Other Ambulatory Visit: Payer: Self-pay | Admitting: Oncology

## 2013-03-04 ENCOUNTER — Ambulatory Visit (HOSPITAL_COMMUNITY)
Admission: RE | Admit: 2013-03-04 | Discharge: 2013-03-04 | Disposition: A | Discharge status: Home / Self Care | Payer: Commercial Managed Care - PPO | Source: Ambulatory Visit | Attending: Oncology | Admitting: Oncology

## 2013-03-04 ENCOUNTER — Encounter (HOSPITAL_COMMUNITY): Payer: Self-pay

## 2013-03-04 DIAGNOSIS — C561 Malignant neoplasm of right ovary: Secondary | ICD-10-CM

## 2013-03-04 DIAGNOSIS — C569 Malignant neoplasm of unspecified ovary: Secondary | ICD-10-CM | POA: Insufficient documentation

## 2013-03-04 DIAGNOSIS — I10 Essential (primary) hypertension: Secondary | ICD-10-CM | POA: Insufficient documentation

## 2013-03-04 LAB — PROTIME-INR: INR: 0.89 (ref 0.00–1.49)

## 2013-03-04 LAB — CBC
HCT: 38.4 % (ref 36.0–46.0)
MCHC: 34.1 g/dL (ref 30.0–36.0)
Platelets: 230 10*3/uL (ref 150–400)
RDW: 13.4 % (ref 11.5–15.5)

## 2013-03-04 LAB — BASIC METABOLIC PANEL
BUN: 13 mg/dL (ref 6–23)
Calcium: 9.1 mg/dL (ref 8.4–10.5)
Chloride: 104 mEq/L (ref 96–112)
Creatinine, Ser: 0.56 mg/dL (ref 0.50–1.10)
GFR calc Af Amer: >90 mL/min (ref >90)
GFR calc non Af Amer: >90 mL/min (ref >90)

## 2013-03-04 MED ORDER — ONDANSETRON HCL 4 MG/2ML IJ SOLN
INTRAMUSCULAR | Status: AC
  Filled 2013-03-04: qty 2

## 2013-03-04 MED ORDER — CEFAZOLIN SODIUM-DEXTROSE 2-3 GM-% IV SOLR
2.0000 g | Freq: Once | INTRAVENOUS | Status: AC
  Administered 2013-03-04: 2 g via INTRAVENOUS
  Filled 2013-03-04: qty 50

## 2013-03-04 MED ORDER — SODIUM CHLORIDE 0.9 % IV SOLN
Freq: Once | INTRAVENOUS | Status: AC
  Administered 2013-03-04: 10:00:00 via INTRAVENOUS

## 2013-03-04 MED ORDER — FENTANYL CITRATE 0.05 MG/ML IJ SOLN
INTRAMUSCULAR | Status: AC | PRN
  Administered 2013-03-04 (×5): 50 ug via INTRAVENOUS

## 2013-03-04 MED ORDER — MIDAZOLAM HCL 2 MG/2ML IJ SOLN
INTRAMUSCULAR | Status: AC
  Filled 2013-03-04: qty 6

## 2013-03-04 MED ORDER — HEPARIN SOD (PORK) LOCK FLUSH 100 UNIT/ML IV SOLN
INTRAVENOUS | Status: AC | PRN
  Administered 2013-03-04: 500 [IU] via INTRAVENOUS

## 2013-03-04 MED ORDER — FENTANYL CITRATE 0.05 MG/ML IJ SOLN
INTRAMUSCULAR | Status: AC
  Filled 2013-03-04: qty 6

## 2013-03-04 MED ORDER — MIDAZOLAM HCL 2 MG/2ML IJ SOLN
INTRAMUSCULAR | Status: AC | PRN
  Administered 2013-03-04 (×5): 1 mg via INTRAVENOUS

## 2013-03-04 MED ORDER — ONDANSETRON HCL 4 MG/2ML IJ SOLN: 4.0000 mg | Freq: Once | INTRAMUSCULAR | Status: AC

## 2013-03-04 NOTE — Progress Notes (Signed)
Patient still nauseated and has vomited after any sips of gingerale or ice chips. VSS. MD paged to inform of continued nausea/vomiting.

## 2013-03-04 NOTE — Progress Notes (Signed)
Dr. Grace Isaac in to see patient at 1430. He stated [name] be d/c when she feels she is able. RN assisted patient across the hall to restroom and back. Was able to void and tolerated walking without any nausea or vomiting. D/C instructions reviewed with patient's husband and daughter. Instructed to call Cancer Center with any problems or questions. Patient has anti-emetic meds at home. Patient states she wants to go home now. Has had no more n/v since 1415 prior to MD coming to evaluate patient. Home with husband.

## 2013-03-04 NOTE — Procedures (Signed)
Successful placement of right IJ approach port-a-cath with tip at the superior caval atrial junction. The catheter is ready for immediate use. No immediate post procedural complications. 

## 2013-03-04 NOTE — Progress Notes (Signed)
Complains of nausea. States when she takes a sip of ginger ale, she cannot keep it down and she is unable to eat saltine crackers. Notified Jeananne Rama, PA. Caryn Bee and Dr. Grace Isaac informed patient we will continue to watch her, no additional antiemetics ordered at this time.

## 2013-03-04 NOTE — H&P (Signed)
Deborah Macdonald is an 57 y.o. female.   Chief Complaint: "I'm getting a port a cath" HPI: Patient with history of metastatic ovarian carcinoma presents today for placement of a port a cath for chemotherapy.  Past Medical History  Diagnosis Date  . PONV (postoperative nausea and vomiting)   . MVP (mitral valve prolapse)   . Hypertension   . Anxiety   . Shortness of breath     at times "anxiety"  . Ovarian cancer on right   . Regional lymph node metastasis present     Past Surgical History  Procedure Laterality Date  . Wisdom tooth extraction    . Lymph node biopsy Left 01/21/2013    Procedure: EXCISIONAL BIOPSY OF THE NECK;  Surgeon: Serena Colonel, MD;  Location: [facility] OR;  Service: ENT;  Laterality: Left;  . Leep      20 year ago    Family History  Problem Relation Age of Onset  . Heart attack Father   . Cancer Maternal Uncle     brain tumor?   Social History:  reports that she quit smoking about 7 weeks ago. She has never used smokeless tobacco. She reports that she drinks about 1.0 ounces of alcohol per week. She reports that she does not use illicit drugs.  Allergies:  Allergies  Allergen Reactions  . Codeine Nausea Only    Current outpatient prescriptions:ALPRAZolam (XANAX) 0.5 MG tablet, Take 0.5 mg by mouth 3 (three) times daily., Disp: , Rfl: ;  ondansetron (ZOFRAN) 8 MG tablet, Take 1 tablet (8 mg total) by mouth 2 (two) times daily. Take two times a day starting the day after chemo for 3 days. Then take two times a day as needed for nausea or vomiting., Disp: 30 tablet, Rfl: 1 zolpidem (AMBIEN) 5 MG tablet, Take 1 tablet (5 mg total) by mouth at bedtime as needed for sleep., Disp: 30 tablet, Rfl: 3;  Aspirin-Salicylamide-Caffeine (BC HEADACHE POWDER PO), Take 1 packet by mouth every 6 (six) hours as needed (headache)., Disp: , Rfl: ;  lidocaine-prilocaine (EMLA) cream, Apply topically as needed. Apply to skin over Porta Cath site at least one hour prior to needle stick, Disp:  30 g, Rfl: 3 loratadine (CLARITIN) 10 MG tablet, Take 10 mg by mouth daily., Disp: , Rfl: ;  prochlorperazine (COMPAZINE) 10 MG tablet, Take 1 tablet (10 mg total) by mouth every 6 (six) hours as needed (Nausea or vomiting)., Disp: 30 tablet, Rfl: 1;  triamterene-hydrochlorothiazide (MAXZIDE-25) 37.5-25 MG per tablet, Take 1 tablet by mouth daily., Disp: , Rfl:  Current facility-administered medications:0.9 %  sodium chloride infusion, , Intravenous, Once, Robet Leu, PA-C;  ceFAZolin (ANCEF) IVPB 2 g/50 mL premix, 2 g, Intravenous, Once, Robet Leu, PA-C;  fentaNYL (SUBLIMAZE) 0.05 MG/ML injection, , , , ;  midazolam (VERSED) 2 MG/2ML injection, , , ,    Results for orders placed during the hospital encounter of 03/04/13 (from the past 48 hour(s))  APTT     Status: None   Collection Time    03/04/13  9:50 AM      Result Value Range   aPTT 28  24 - 37 seconds  BASIC METABOLIC PANEL     Status: Abnormal   Collection Time    03/04/13  9:50 AM      Result Value Range   Sodium 140  135 - 145 mEq/L   Potassium 3.6  3.5 - 5.1 mEq/L   Chloride 104  96 - 112 mEq/L  CO2 27  19 - 32 mEq/L   Glucose, Bld 100 (*) 70 - 99 mg/dL   BUN 13  6 - 23 mg/dL   Creatinine, Ser 4.09  0.50 - 1.10 mg/dL   Calcium 9.1  8.4 - 81.1 mg/dL   GFR calc non Af Amer >90  >90 mL/min   GFR calc Af Amer >90  >90 mL/min   Comment:            The eGFR has been calculated     using the CKD EPI equation.     This calculation has not been     validated in all clinical     situations.     eGFR's persistently     <90 mL/min signify     possible Chronic Kidney Disease.  CBC     Status: None   Collection Time    03/04/13  9:50 AM      Result Value Range   WBC 6.7  4.0 - 10.5 K/uL   RBC 4.26  3.87 - 5.11 MIL/uL   Hemoglobin 13.1  12.0 - 15.0 g/dL   HCT 91.4  78.2 - 95.6 %   MCV 90.1  78.0 - 100.0 fL   MCH 30.8  26.0 - 34.0 pg   MCHC 34.1  30.0 - 36.0 g/dL   RDW 21.3  08.6 - 57.8 %   Platelets 230  150 -  400 K/uL  PROTIME-INR     Status: None   Collection Time    03/04/13  9:50 AM      Result Value Range   Prothrombin Time 12.0  11.6 - 15.2 seconds   INR 0.89  0.00 - 1.49   No results found.  Review of Systems  Constitutional: Negative for fever and chills.  Respiratory: Negative for cough and shortness of breath.   Cardiovascular: Negative for chest pain.  Gastrointestinal: Negative for nausea, vomiting and abdominal pain.  Musculoskeletal: Negative for back pain.  Neurological: Negative for headaches.  Endo/Heme/Allergies: Does not bruise/bleed easily.  Psychiatric/Behavioral: The patient is nervous/anxious.     Blood pressure 150/90, pulse 91, temperature 97.2 F (36.2 C), temperature source Oral, resp. rate 16, SpO2 98.00%. Physical Exam  Constitutional: She is oriented to person, place, and time. She appears well-developed and well-nourished.  Cardiovascular: Normal rate and regular rhythm.   Respiratory: Effort normal and breath sounds normal.  GI: Soft. Bowel sounds are normal. There is no tenderness.  Musculoskeletal: Normal range of motion. She exhibits no edema.  Neurological: She is alert and oriented to person, place, and time.     Assessment/Plan Pt with hx metastatic ovarian carcinoma. Plan is for port a cath placement today for chemotherapy. Details/risks of procedure d/w pt/daughter with their understanding and consent.  Lior Hoen,D KEVIN 03/04/2013, 10:52 AM

## 2013-03-08 ENCOUNTER — Telehealth: Payer: Self-pay | Admitting: *Deleted

## 2013-03-08 NOTE — Telephone Encounter (Signed)
Pt called to ask if she can still use the EMLA cream on her new Port a Cath even though steri strips still in place?  Instructed pt to apply EMLA over steri strips, over the [place] site and cover w/ saran wrap or press and seal.  Informed her the steri strips will not be a problem and will fall off eventually.  She verbalized understanding.

## 2013-03-09 ENCOUNTER — Other Ambulatory Visit (HOSPITAL_BASED_OUTPATIENT_CLINIC_OR_DEPARTMENT_OTHER): Payer: Commercial Managed Care - PPO | Admitting: Lab

## 2013-03-09 ENCOUNTER — Ambulatory Visit (HOSPITAL_BASED_OUTPATIENT_CLINIC_OR_DEPARTMENT_OTHER): Payer: Commercial Managed Care - PPO

## 2013-03-09 VITALS — BP 137/75 | HR 81 | Temp 98.5°F | Resp 20

## 2013-03-09 DIAGNOSIS — C801 Malignant (primary) neoplasm, unspecified: Secondary | ICD-10-CM

## 2013-03-09 DIAGNOSIS — Z5111 Encounter for antineoplastic chemotherapy: Secondary | ICD-10-CM

## 2013-03-09 DIAGNOSIS — C779 Secondary and unspecified malignant neoplasm of lymph node, unspecified: Secondary | ICD-10-CM

## 2013-03-09 DIAGNOSIS — C561 Malignant neoplasm of right ovary: Secondary | ICD-10-CM

## 2013-03-09 DIAGNOSIS — C569 Malignant neoplasm of unspecified ovary: Secondary | ICD-10-CM

## 2013-03-09 LAB — CBC WITH DIFFERENTIAL/PLATELET
BASO%: 0.8 % (ref 0.0–2.0)
EOS%: 0.6 % (ref 0.0–7.0)
HCT: 35.4 % (ref 34.8–46.6)
LYMPH%: 26.7 % (ref 14.0–49.7)
MCH: 30.5 pg (ref 25.1–34.0)
MCHC: 33.6 g/dL (ref 31.5–36.0)
NEUT%: 64 % (ref 38.4–76.8)
Platelets: 177 10*3/uL (ref 145–400)
lymph#: 1.4 10*3/uL (ref 0.9–3.3)

## 2013-03-09 MED ORDER — HEPARIN SOD (PORK) LOCK FLUSH 100 UNIT/ML IV SOLN
500.0000 [IU] | Freq: Once | INTRAVENOUS | Status: AC | PRN
  Administered 2013-03-09: 500 [IU]
  Filled 2013-03-09: qty 5

## 2013-03-09 MED ORDER — SODIUM CHLORIDE 0.9 % IV SOLN
Freq: Once | INTRAVENOUS | Status: AC
  Administered 2013-03-09: 10:00:00 via INTRAVENOUS

## 2013-03-09 MED ORDER — SODIUM CHLORIDE 0.9 % IV SOLN
80.0000 mg/m2 | Freq: Once | INTRAVENOUS | Status: AC
  Administered 2013-03-09: 132 mg via INTRAVENOUS
  Filled 2013-03-09: qty 22

## 2013-03-09 MED ORDER — SODIUM CHLORIDE 0.9 % IJ SOLN
10.0000 mL | INTRAMUSCULAR | Status: DC | PRN
  Administered 2013-03-09: 10 mL
  Filled 2013-03-09: qty 10

## 2013-03-09 MED ORDER — DEXAMETHASONE SODIUM PHOSPHATE 20 MG/5ML IJ SOLN
20.0000 mg | Freq: Once | INTRAMUSCULAR | Status: AC
  Administered 2013-03-09: 20 mg via INTRAVENOUS

## 2013-03-09 MED ORDER — DIPHENHYDRAMINE HCL 50 MG/ML IJ SOLN
50.0000 mg | Freq: Once | INTRAMUSCULAR | Status: AC
  Administered 2013-03-09: 50 mg via INTRAVENOUS

## 2013-03-09 MED ORDER — FAMOTIDINE IN NACL 20-0.9 MG/50ML-% IV SOLN
20.0000 mg | Freq: Once | INTRAVENOUS | Status: AC
  Administered 2013-03-09: 20 mg via INTRAVENOUS

## 2013-03-09 MED ORDER — ONDANSETRON 8 MG/50ML IVPB (CHCC)
8.0000 mg | Freq: Once | INTRAVENOUS | Status: AC
  Administered 2013-03-09: 8 mg via INTRAVENOUS

## 2013-03-09 NOTE — Patient Instructions (Signed)
[facility] Discharge Instructions for Patients Receiving Chemotherapy  Today you received the following chemotherapy agents: Taxol.  To help prevent nausea and vomiting after your treatment, we encourage you to take your nausea medication, Zofran. Begin taking it the morning of 5/29 and take it as often as prescribed for the next 48 hours.   If you develop nausea and vomiting that is not controlled by your nausea medication, call the clinic. If it is after clinic hours your family physician or the after hours number for the clinic or go to the Emergency Department.   BELOW ARE SYMPTOMS THAT SHOULD BE REPORTED IMMEDIATELY:  *FEVER GREATER THAN 100.5 F  *CHILLS WITH OR WITHOUT FEVER  NAUSEA AND VOMITING THAT IS NOT CONTROLLED WITH YOUR NAUSEA MEDICATION  *UNUSUAL SHORTNESS OF BREATH  *UNUSUAL BRUISING OR BLEEDING  TENDERNESS IN MOUTH AND THROAT WITH OR WITHOUT PRESENCE OF ULCERS  *URINARY PROBLEMS  *BOWEL PROBLEMS  UNUSUAL RASH Items with * indicate a potential emergency and should be followed up as soon as possible.  Feel free to call the clinic you have any questions or concerns. The clinic phone number is [phone].   I have been informed and understand all the instructions given to me. I know to contact the clinic, my physician, or go to the Emergency Department if any problems should occur. I do not have any questions at this time, but understand that I Ober call the clinic during office hours   should I have any questions or need assistance in obtaining follow up care.

## 2013-03-16 ENCOUNTER — Other Ambulatory Visit (HOSPITAL_BASED_OUTPATIENT_CLINIC_OR_DEPARTMENT_OTHER): Payer: Commercial Managed Care - PPO | Admitting: Lab

## 2013-03-16 ENCOUNTER — Ambulatory Visit (HOSPITAL_BASED_OUTPATIENT_CLINIC_OR_DEPARTMENT_OTHER): Payer: Commercial Managed Care - PPO

## 2013-03-16 VITALS — BP 150/82 | HR 86 | Temp 98.3°F

## 2013-03-16 DIAGNOSIS — C779 Secondary and unspecified malignant neoplasm of lymph node, unspecified: Secondary | ICD-10-CM

## 2013-03-16 DIAGNOSIS — C569 Malignant neoplasm of unspecified ovary: Secondary | ICD-10-CM

## 2013-03-16 DIAGNOSIS — C561 Malignant neoplasm of right ovary: Secondary | ICD-10-CM

## 2013-03-16 DIAGNOSIS — C801 Malignant (primary) neoplasm, unspecified: Secondary | ICD-10-CM

## 2013-03-16 DIAGNOSIS — Z5111 Encounter for antineoplastic chemotherapy: Secondary | ICD-10-CM

## 2013-03-16 LAB — CBC WITH DIFFERENTIAL/PLATELET
EOS%: 0.6 % (ref 0.0–7.0)
MCH: 30.6 pg (ref 25.1–34.0)
MCV: 91.6 fL (ref 79.5–101.0)
MONO%: 8.9 % (ref 0.0–14.0)
RBC: 4.05 10*6/uL (ref 3.70–5.45)
RDW: 13.6 % (ref 11.2–14.5)

## 2013-03-16 MED ORDER — SODIUM CHLORIDE 0.9 % IV SOLN
Freq: Once | INTRAVENOUS | Status: AC
  Administered 2013-03-16: 11:00:00 via INTRAVENOUS

## 2013-03-16 MED ORDER — HEPARIN SOD (PORK) LOCK FLUSH 100 UNIT/ML IV SOLN
500.0000 [IU] | Freq: Once | INTRAVENOUS | Status: AC | PRN
  Administered 2013-03-16: 500 [IU]
  Filled 2013-03-16: qty 5

## 2013-03-16 MED ORDER — FAMOTIDINE IN NACL 20-0.9 MG/50ML-% IV SOLN
20.0000 mg | Freq: Once | INTRAVENOUS | Status: AC
  Administered 2013-03-16: 20 mg via INTRAVENOUS

## 2013-03-16 MED ORDER — SODIUM CHLORIDE 0.9 % IJ SOLN
10.0000 mL | INTRAMUSCULAR | Status: DC | PRN
  Administered 2013-03-16: 10 mL
  Filled 2013-03-16: qty 10

## 2013-03-16 MED ORDER — ONDANSETRON 8 MG/50ML IVPB (CHCC)
8.0000 mg | Freq: Once | INTRAVENOUS | Status: AC
  Administered 2013-03-16: 8 mg via INTRAVENOUS

## 2013-03-16 MED ORDER — DIPHENHYDRAMINE HCL 50 MG/ML IJ SOLN
50.0000 mg | Freq: Once | INTRAMUSCULAR | Status: AC
  Administered 2013-03-16: 50 mg via INTRAVENOUS

## 2013-03-16 MED ORDER — SODIUM CHLORIDE 0.9 % IV SOLN
80.0000 mg/m2 | Freq: Once | INTRAVENOUS | Status: AC
  Administered 2013-03-16: 132 mg via INTRAVENOUS
  Filled 2013-03-16: qty 22

## 2013-03-16 MED ORDER — DEXAMETHASONE SODIUM PHOSPHATE 20 MG/5ML IJ SOLN
20.0000 mg | Freq: Once | INTRAMUSCULAR | Status: AC
  Administered 2013-03-16: 20 mg via INTRAVENOUS

## 2013-03-16 NOTE — Patient Instructions (Signed)
[facility] Discharge Instructions for Patients Receiving Chemotherapy  Today you received the following chemotherapy agents: Taxol.  To help prevent nausea and vomiting after your treatment, we encourage you to take your nausea medication as prescribed.   If you develop nausea and vomiting that is not controlled by your nausea medication, call the clinic.   BELOW ARE SYMPTOMS THAT SHOULD BE REPORTED IMMEDIATELY:  *FEVER GREATER THAN 100.5 F  *CHILLS WITH OR WITHOUT FEVER  NAUSEA AND VOMITING THAT IS NOT CONTROLLED WITH YOUR NAUSEA MEDICATION  *UNUSUAL SHORTNESS OF BREATH  *UNUSUAL BRUISING OR BLEEDING  TENDERNESS IN MOUTH AND THROAT WITH OR WITHOUT PRESENCE OF ULCERS  *URINARY PROBLEMS  *BOWEL PROBLEMS  UNUSUAL RASH Items with * indicate a potential emergency and should be followed up as soon as possible.  Feel free to call the clinic you have any questions or concerns. The clinic phone number is [phone].    

## 2013-03-16 NOTE — Progress Notes (Signed)
Patient's daughter is driving her home as she received 50 mg Benadryl IV.

## 2013-03-17 ENCOUNTER — Ambulatory Visit (HOSPITAL_BASED_OUTPATIENT_CLINIC_OR_DEPARTMENT_OTHER): Payer: Commercial Managed Care - PPO | Admitting: Genetic Counselor

## 2013-03-17 ENCOUNTER — Encounter: Payer: Self-pay | Admitting: Genetic Counselor

## 2013-03-17 ENCOUNTER — Other Ambulatory Visit: Payer: Commercial Managed Care - PPO | Admitting: Lab

## 2013-03-17 DIAGNOSIS — IMO0002 Reserved for concepts with insufficient information to code with codable children: Secondary | ICD-10-CM

## 2013-03-17 DIAGNOSIS — C561 Malignant neoplasm of right ovary: Secondary | ICD-10-CM

## 2013-03-17 DIAGNOSIS — C569 Malignant neoplasm of unspecified ovary: Secondary | ICD-10-CM

## 2013-03-17 NOTE — Progress Notes (Signed)
Dr.  [name] requested a consultation for genetic counseling and risk assessment for Deborah Macdonald, a 57 y.o. female, for discussion of her personal history of ovarian cancer. She presents to clinic today to discuss the possibility of a genetic predisposition to cancer, and to further clarify her risks, as well as her family members' risks for cancer.   HISTORY OF PRESENT ILLNESS: In April 2014, at the age of 27, Deborah Macdonald was diagnosed with ovarian cancer of the right ovary. This is currently being treated with chemotherapy, with plans for surgery and radiation.  [name] has had a colonoscopy where 1-2 polyps have been found.  In 2001 she had endometrial polyps removed that were benign.    Past Medical History  Diagnosis Date  . PONV (postoperative nausea and vomiting)   . MVP (mitral valve prolapse)   . Hypertension   . Anxiety   . Shortness of breath     at times "anxiety"  . Ovarian cancer on right   . Regional lymph node metastasis present     Past Surgical History  Procedure Laterality Date  . Wisdom tooth extraction    . Lymph node biopsy Left 01/21/2013    Procedure: EXCISIONAL BIOPSY OF THE NECK;  Surgeon: Serena Colonel, MD;  Location: [facility] OR;  Service: ENT;  Laterality: Left;  . Leep      20 year ago    History  Substance Use Topics  . Smoking status: Former Smoker -- 1.00 packs/day for 39 years    Quit date: 01/13/2013  . Smokeless tobacco: Never Used  . Alcohol Use: 1.0 oz/week    2 drink(s) per week    REPRODUCTIVE HISTORY AND PERSONAL RISK ASSESSMENT FACTORS: Menarche was at age 89.   Menopause at 46 Uterus Intact: Yes Ovaries Intact: Yes G3P3A0, first live birth at age 44  She has not previously undergone treatment for infertility.   OCP use for 15-20 years   She has used HRT in the past.    FAMILY HISTORY:  We obtained a detailed, 4-generation family history.  Significant diagnoses are listed below: Family History  Problem Relation Age of Onset  .  Heart attack Father   . Melanoma Maternal Uncle     dx in his 1s; mets to brain  . Dementia Maternal Grandmother   . COPD Maternal Grandfather   . Dementia Paternal Grandmother   . COPD Paternal Grandfather   . Testicular cancer Son 41   Patient's maternal ancestors are of English descent, and paternal ancestors are of English descent. There is no reported Ashkenazi Jewish ancestry. There is no known consanguinity.  GENETIC COUNSELING ASSESSMENT: [name] is a 57 y.o. female with a personal history of endometrial polyps, colon polyps and ovarian cancer which somewhat suggestive of a hereditary cancer syndrome and predisposition to cancer. We, therefore, discussed and recommended the following at today's visit.   DISCUSSION: We reviewed the characteristics, features and inheritance patterns of hereditary cancer syndromes. We also discussed genetic testing, including the appropriate family members to test, the process of testing, insurance coverage and turn-around-time for results. We recommended [name] pursue genetic testing for comprehensive cancer panel at [facility]. [name] is still in the process of coming to terms with her diagnosis and what it Glanz mean for her and her family.  She was tearful at todays visit, and feeling overwhelmed.  We dicussed that genetic testing can be performed at any time, so she could  think about it and discuss testing with her family.  Much of her concern is performing testing on genes that increased her risk for getting ovarian cancer, but Szczerba also increase her risk for pancreatic cancer.  She feels that she could not handle learning that she Weilbacher have an increased risk for pancreatic cancer in the midst of being treated for her ovarian cancer.  PLAN: We discussed the implications of a positive, negative and/ or variant of uncertain significance genetic test result. Results should be available within approximately 3-4 weeks' time, at which point they will be  disclosed by telephone to [facility], as will any additional recommendations warranted by these results. [name] will receive a summary of her genetic counseling visit and a copy of her results once available. This information will also be available in Epic. We encouraged [name] to remain in contact with cancer genetics annually so that we can continuously update the family history and inform her of any changes in cancer genetics and testing that [name] be of benefit for her family. [name]'s questions were answered to her satisfaction today. Our contact information was provided should additional questions or concerns arise.  After considering the risks, benefits, and limitations, [name] decided to think about testing.  She will recontact me in the future if she decides to proceed with it.    The patient was seen for a total of 60 minutes, greater than 50% of which was spent face-to-face counseling.  This plan is being carried out per Dr. Feliz Beam recommendations.  This note will also be sent to the referring provider via the electronic medical record. The patient will be supplied with a summary of this genetic counseling discussion as well as educational information on the discussed hereditary cancer syndromes following the conclusion of their visit.   Patient was discussed with Dr. Drue Second.   _______________________________________________________________________ For Office Staff:  Number of people involved in session: 1 Was an Intern/ student involved with case: yes

## 2013-03-23 ENCOUNTER — Other Ambulatory Visit: Payer: Commercial Managed Care - PPO | Admitting: Lab

## 2013-03-23 ENCOUNTER — Ambulatory Visit (HOSPITAL_BASED_OUTPATIENT_CLINIC_OR_DEPARTMENT_OTHER): Payer: Commercial Managed Care - PPO

## 2013-03-23 ENCOUNTER — Ambulatory Visit (HOSPITAL_BASED_OUTPATIENT_CLINIC_OR_DEPARTMENT_OTHER): Payer: Commercial Managed Care - PPO | Admitting: Oncology

## 2013-03-23 ENCOUNTER — Other Ambulatory Visit (HOSPITAL_BASED_OUTPATIENT_CLINIC_OR_DEPARTMENT_OTHER): Payer: Commercial Managed Care - PPO | Admitting: Lab

## 2013-03-23 ENCOUNTER — Encounter: Payer: Self-pay | Admitting: Oncology

## 2013-03-23 ENCOUNTER — Ambulatory Visit: Payer: Commercial Managed Care - PPO | Admitting: Oncology

## 2013-03-23 VITALS — BP 157/87 | HR 90 | Temp 98.6°F | Resp 18 | Ht 66.0 in | Wt 128.9 lb

## 2013-03-23 DIAGNOSIS — C561 Malignant neoplasm of right ovary: Secondary | ICD-10-CM

## 2013-03-23 DIAGNOSIS — Z5111 Encounter for antineoplastic chemotherapy: Secondary | ICD-10-CM

## 2013-03-23 DIAGNOSIS — C779 Secondary and unspecified malignant neoplasm of lymph node, unspecified: Secondary | ICD-10-CM

## 2013-03-23 DIAGNOSIS — C569 Malignant neoplasm of unspecified ovary: Secondary | ICD-10-CM

## 2013-03-23 DIAGNOSIS — C801 Malignant (primary) neoplasm, unspecified: Secondary | ICD-10-CM

## 2013-03-23 DIAGNOSIS — F341 Dysthymic disorder: Secondary | ICD-10-CM

## 2013-03-23 DIAGNOSIS — G4700 Insomnia, unspecified: Secondary | ICD-10-CM

## 2013-03-23 LAB — COMPREHENSIVE METABOLIC PANEL (CC13)
ALT: 7 U/L (ref 0–55)
Albumin: 3.5 g/dL (ref 3.5–5.0)
CO2: 27 mEq/L (ref 22–29)
Calcium: 9.1 mg/dL (ref 8.4–10.4)
Chloride: 106 mEq/L (ref 98–107)
Glucose: 94 mg/dl (ref 70–99)
Potassium: 4.2 mEq/L (ref 3.5–5.1)
Sodium: 138 mEq/L (ref 136–145)
Total Bilirubin: <0.2 mg/dL (ref 0.20–1.20)
Total Protein: 6.4 g/dL (ref 6.4–8.3)

## 2013-03-23 LAB — CBC WITH DIFFERENTIAL/PLATELET
Basophils Absolute: 0 10*3/uL (ref 0.0–0.1)
Eosinophils Absolute: 0 10*3/uL (ref 0.0–0.5)
HGB: 12.6 g/dL (ref 11.6–15.9)
MCV: 92.9 fL (ref 79.5–101.0)
MONO#: 0.5 10*3/uL (ref 0.1–0.9)
MONO%: 8.8 % (ref 0.0–14.0)
NEUT#: 3.5 10*3/uL (ref 1.5–6.5)
RDW: 14.4 % (ref 11.2–14.5)
WBC: 5.9 10*3/uL (ref 3.9–10.3)
nRBC: 0 % (ref 0–0)

## 2013-03-23 MED ORDER — FAMOTIDINE IN NACL 20-0.9 MG/50ML-% IV SOLN
20.0000 mg | Freq: Once | INTRAVENOUS | Status: AC
  Administered 2013-03-23: 20 mg via INTRAVENOUS

## 2013-03-23 MED ORDER — SODIUM CHLORIDE 0.9 % IV SOLN
651.6000 mg | Freq: Once | INTRAVENOUS | Status: AC
  Administered 2013-03-23: 650 mg via INTRAVENOUS
  Filled 2013-03-23: qty 65

## 2013-03-23 MED ORDER — SODIUM CHLORIDE 0.9 % IJ SOLN
10.0000 mL | INTRAMUSCULAR | Status: DC | PRN
  Administered 2013-03-23: 10 mL
  Filled 2013-03-23: qty 10

## 2013-03-23 MED ORDER — SODIUM CHLORIDE 0.9 % IV SOLN
Freq: Once | INTRAVENOUS | Status: AC
  Administered 2013-03-23: 11:00:00 via INTRAVENOUS

## 2013-03-23 MED ORDER — HEPARIN SOD (PORK) LOCK FLUSH 100 UNIT/ML IV SOLN
500.0000 [IU] | Freq: Once | INTRAVENOUS | Status: AC | PRN
  Administered 2013-03-23: 500 [IU]
  Filled 2013-03-23: qty 5

## 2013-03-23 MED ORDER — SODIUM CHLORIDE 0.9 % IV SOLN
80.0000 mg/m2 | Freq: Once | INTRAVENOUS | Status: AC
  Administered 2013-03-23: 132 mg via INTRAVENOUS
  Filled 2013-03-23: qty 22

## 2013-03-23 MED ORDER — DEXAMETHASONE SODIUM PHOSPHATE 20 MG/5ML IJ SOLN
20.0000 mg | Freq: Once | INTRAMUSCULAR | Status: AC
  Administered 2013-03-23: 20 mg via INTRAVENOUS

## 2013-03-23 MED ORDER — DIPHENHYDRAMINE HCL 50 MG/ML IJ SOLN
50.0000 mg | Freq: Once | INTRAMUSCULAR | Status: AC
  Administered 2013-03-23: 50 mg via INTRAVENOUS

## 2013-03-23 MED ORDER — ONDANSETRON 16 MG/50ML IVPB (CHCC)
16.0000 mg | Freq: Once | INTRAVENOUS | Status: AC
  Administered 2013-03-23: 16 mg via INTRAVENOUS

## 2013-03-23 NOTE — Progress Notes (Signed)
Deborah Macdonald  [phone] [phone]   OFFICE PROGRESS NOTE   Cc:  Primus Bravo, NP  DIAGNOSIS:  Metastatic ovarian cancer.   PAST THERAPY:  Biopsy only.   CURRENT THERAPY: started neoadjuvant Carboplatin d1; Taxol d1,8,15 every 3 weeks on 02/09/2013.  Goal to is to be seen by Dr. [name] [facility]) after about 4 cycles of chemo to see if she is a resectable candidate.   INTERVAL HISTORY: Deborah Macdonald 56 y.o. female returns for regular follow up by herself.  She reports feeling well.  She has not had side effect from chemo. She denied fever, SOB, nausea/vomiting, abd pain, bleeding.  She has mild, transient neuropathy lasting for a few minutes after chemo last week.  She has no problem using her phone, getting dressed, writing.  Her mood is stable.  She is having good rapport with the therapist.  PAC has been placed since her last visit. The rest of the 14-point review of system was negative.    Past Medical History  Diagnosis Date  . PONV (postoperative nausea and vomiting)   . MVP (mitral valve prolapse)   . Hypertension   . Anxiety   . Shortness of breath     at times "anxiety"  . Ovarian cancer on right   . Regional lymph node metastasis present     Past Surgical History  Procedure Laterality Date  . Wisdom tooth extraction    . Lymph node biopsy Left 01/21/2013    Procedure: EXCISIONAL BIOPSY OF THE NECK;  Surgeon: Serena Colonel, MD;  Location: [facility] OR;  Service: ENT;  Laterality: Left;  . Leep      20 year ago    Current Outpatient Prescriptions  Medication Sig Dispense Refill  . ALPRAZolam (XANAX) 0.5 MG tablet Take 0.5 mg by mouth 3 (three) times daily.      . Aspirin-Salicylamide-Caffeine (BC HEADACHE POWDER PO) Take 1 packet by mouth every 6 (six) hours as needed (headache).      . lidocaine-prilocaine (EMLA) cream Apply topically as needed. Apply to skin over Porta Cath site at least one hour prior to needle stick  30 g  3  .  loratadine (CLARITIN) 10 MG tablet Take 10 mg by mouth daily.      . ondansetron (ZOFRAN) 8 MG tablet Take 1 tablet (8 mg total) by mouth 2 (two) times daily. Take two times a day starting the day after chemo for 3 days. Then take two times a day as needed for nausea or vomiting.  30 tablet  1  . prochlorperazine (COMPAZINE) 10 MG tablet Take 1 tablet (10 mg total) by mouth every 6 (six) hours as needed (Nausea or vomiting).  30 tablet  1  . triamterene-hydrochlorothiazide (MAXZIDE-25) 37.5-25 MG per tablet Take 1 tablet by mouth daily.      Marland Kitchen zolpidem (AMBIEN) 5 MG tablet Take 1 tablet (5 mg total) by mouth at bedtime as needed for sleep.  30 tablet  3   No current facility-administered medications for this visit.   Facility-Administered Medications Ordered in Other Visits  Medication Dose Route Frequency Provider Last Rate Last Dose  . 0.9 %  sodium chloride infusion   Intravenous Once Exie Parody, MD      . CARBOplatin (PARAPLATIN) 650 mg in sodium chloride 0.9 % 250 mL chemo infusion  650 mg Intravenous Once Exie Parody, MD      . Dexamethasone Sodium Phosphate (DECADRON) injection 20 mg  20 mg  Intravenous Once Exie Parody, MD      . diphenhydrAMINE (BENADRYL) injection 50 mg  50 mg Intravenous Once Exie Parody, MD      . famotidine (PEPCID) IVPB 20 mg  20 mg Intravenous Once Exie Parody, MD   20 mg at 03/23/13 1042  . heparin lock flush 100 unit/mL  500 Units Intracatheter Once PRN Exie Parody, MD      . ondansetron (ZOFRAN) IVPB 16 mg  16 mg Intravenous Once Exie Parody, MD      . PACLitaxel (TAXOL) 132 mg in sodium chloride 0.9 % 250 mL chemo infusion (</= 80mg /m2)  80 mg/m2 (Treatment Plan Actual) Intravenous Once Exie Parody, MD      . sodium chloride 0.9 % injection 10 mL  10 mL Intracatheter PRN Exie Parody, MD        ALLERGIES:  is allergic to codeine.  REVIEW OF SYSTEMS:  The rest of the 14-point review of system was negative.   Filed Vitals:   03/23/13 0957  BP: 157/87  Pulse: 90  Temp: 98.6  F (37 C)  Resp: 18   Wt Readings from Last 3 Encounters:  03/23/13 128 lb 14.4 oz (58.469 kg)  03/02/13 129 lb 12.8 oz (58.877 kg)  02/23/13 128 lb 6.4 oz (58.242 kg)   ECOG Performance status: 0  PHYSICAL EXAMINATION:   General:  well-nourished woman,  in no acute distress.  Eyes:  no scleral icterus.  ENT:  There were no oropharyngeal lesions.  Neck was without thyromegaly.  Lymphatics:  Negative cervical, supraclavicular or axillary adenopathy.  Respiratory: lungs were clear bilaterally without wheezing or crackles.  Cardiovascular:  Regular rate and rhythm, S1/S2, without murmur, rub or gallop.  There was no pedal edema.  GI:  abdomen was soft, flat, nontender, nondistended, without organomegaly.  Muscoloskeletal:  no spinal tenderness of palpation of vertebral spine.  Skin exam was without echymosis, petichae.  Neuro exam was nonfocal.  Patient was able to get on and off exam table without assistance.  Gait was normal.  Patient was alert and oriented.  Attention was good.   Language was appropriate.  Mood was normal without depression.  Speech was not pressured.  Thought content was not tangential.     LABORATORY/RADIOLOGY DATA:  Lab Results  Component Value Date   WBC 5.9 03/23/2013   HGB 12.6 03/23/2013   HCT 37.8 03/23/2013   PLT 238 03/23/2013   GLUCOSE 100* 03/04/2013   ALKPHOS 69 03/02/2013   ALT 9 03/02/2013   AST 11 03/02/2013   NA 140 03/04/2013   K 3.6 03/04/2013   CL 104 03/04/2013   CREATININE 0.56 03/04/2013   BUN 13 03/04/2013   CO2 27 03/04/2013   INR 0.89 03/04/2013     ASSESSMENT AND PLAN:   1.  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   - s/p cycle#2 of chemo.  She did well with no dose limiting toxicity.  She had grade 1 neuropathy. - I recommended to proceed with the 3rd cycle of chemo Carboplatin/Taxol without dose modification.  2.  Anxiety/Depression:  Mood improved with therapy. She has Lexapro at home that she  has not yet started. She plans to start this soon.  3.  Insomnia:  Due to steroid/chemo.  Using Ambien about once a day.  4.  Follow up:  Weekly lab and chemo Taxol (carboplatin is only q3wk).  Return visit in about 3  weeks for the next cycle of chemo.     The length of time of the face-to-face encounter was 15 minutes. More than 50% of time was spent counseling and coordination of care.

## 2013-03-23 NOTE — Patient Instructions (Signed)
[facility] Discharge Instructions for Patients Receiving Chemotherapy  Today you received the following chemotherapy agents:  Taxol, carboplatin  To help prevent nausea and vomiting after your treatment, we encourage you to take your nausea medication.  Take it as often as prescribed.     If you develop nausea and vomiting that is not controlled by your nausea medication, call the clinic. If it is after clinic hours your family physician or the after hours number for the clinic or go to the Emergency Department.   BELOW ARE SYMPTOMS THAT SHOULD BE REPORTED IMMEDIATELY:  *FEVER GREATER THAN 100.5 F  *CHILLS WITH OR WITHOUT FEVER  NAUSEA AND VOMITING THAT IS NOT CONTROLLED WITH YOUR NAUSEA MEDICATION  *UNUSUAL SHORTNESS OF BREATH  *UNUSUAL BRUISING OR BLEEDING  TENDERNESS IN MOUTH AND THROAT WITH OR WITHOUT PRESENCE OF ULCERS  *URINARY PROBLEMS  *BOWEL PROBLEMS  UNUSUAL RASH Items with * indicate a potential emergency and should be followed up as soon as possible.  Feel free to call the clinic you have any questions or concerns. The clinic phone number is [phone].   I have been informed and understand all the instructions given to me. I know to contact the clinic, my physician, or go to the Emergency Department if any problems should occur. I do not have any questions at this time, but understand that I Eckstrom call the clinic during office hours   should I have any questions or need assistance in obtaining follow up care.    __________________________________________  _____________  __________ Signature of Patient or Authorized Representative            Date                   Time    __________________________________________ Nurse's Signature    

## 2013-03-24 ENCOUNTER — Ambulatory Visit: Payer: Self-pay | Admitting: Specialist

## 2013-03-29 NOTE — Progress Notes (Signed)
Met with Pt on 03/24/13 for individual counseling.  -Kandy Garrison, M.Ed., NCC

## 2013-03-30 ENCOUNTER — Ambulatory Visit (HOSPITAL_BASED_OUTPATIENT_CLINIC_OR_DEPARTMENT_OTHER): Payer: Commercial Managed Care - PPO

## 2013-03-30 ENCOUNTER — Other Ambulatory Visit (HOSPITAL_BASED_OUTPATIENT_CLINIC_OR_DEPARTMENT_OTHER): Payer: Commercial Managed Care - PPO | Admitting: Lab

## 2013-03-30 VITALS — BP 167/82 | HR 97 | Temp 98.9°F

## 2013-03-30 DIAGNOSIS — C569 Malignant neoplasm of unspecified ovary: Secondary | ICD-10-CM

## 2013-03-30 DIAGNOSIS — C779 Secondary and unspecified malignant neoplasm of lymph node, unspecified: Secondary | ICD-10-CM

## 2013-03-30 DIAGNOSIS — Z5111 Encounter for antineoplastic chemotherapy: Secondary | ICD-10-CM

## 2013-03-30 DIAGNOSIS — C801 Malignant (primary) neoplasm, unspecified: Secondary | ICD-10-CM

## 2013-03-30 DIAGNOSIS — C561 Malignant neoplasm of right ovary: Secondary | ICD-10-CM

## 2013-03-30 LAB — CBC WITH DIFFERENTIAL/PLATELET
BASO%: 0.7 % (ref 0.0–2.0)
Basophils Absolute: 0 10*3/uL (ref 0.0–0.1)
Eosinophils Absolute: 0 10*3/uL (ref 0.0–0.5)
HCT: 35.8 % (ref 34.8–46.6)
HGB: 12 g/dL (ref 11.6–15.9)
LYMPH%: 29.5 % (ref 14.0–49.7)
MCHC: 33.5 g/dL (ref 31.5–36.0)
MONO#: 0.4 10*3/uL (ref 0.1–0.9)
NEUT%: 62.8 % (ref 38.4–76.8)
Platelets: 230 10*3/uL (ref 145–400)
WBC: 5.5 10*3/uL (ref 3.9–10.3)

## 2013-03-30 MED ORDER — SODIUM CHLORIDE 0.9 % IJ SOLN
10.0000 mL | INTRAMUSCULAR | Status: DC | PRN
  Administered 2013-03-30: 10 mL
  Filled 2013-03-30: qty 10

## 2013-03-30 MED ORDER — ONDANSETRON 8 MG/50ML IVPB (CHCC)
8.0000 mg | Freq: Once | INTRAVENOUS | Status: AC
  Administered 2013-03-30: 8 mg via INTRAVENOUS

## 2013-03-30 MED ORDER — DIPHENHYDRAMINE HCL 50 MG/ML IJ SOLN
50.0000 mg | Freq: Once | INTRAMUSCULAR | Status: AC
  Administered 2013-03-30: 50 mg via INTRAVENOUS

## 2013-03-30 MED ORDER — SODIUM CHLORIDE 0.9 % IV SOLN
Freq: Once | INTRAVENOUS | Status: AC
  Administered 2013-03-30: 12:00:00 via INTRAVENOUS

## 2013-03-30 MED ORDER — SODIUM CHLORIDE 0.9 % IV SOLN
80.0000 mg/m2 | Freq: Once | INTRAVENOUS | Status: AC
  Administered 2013-03-30: 132 mg via INTRAVENOUS
  Filled 2013-03-30: qty 22

## 2013-03-30 MED ORDER — HEPARIN SOD (PORK) LOCK FLUSH 100 UNIT/ML IV SOLN
500.0000 [IU] | Freq: Once | INTRAVENOUS | Status: AC | PRN
  Administered 2013-03-30: 500 [IU]
  Filled 2013-03-30: qty 5

## 2013-03-30 MED ORDER — FAMOTIDINE IN NACL 20-0.9 MG/50ML-% IV SOLN
20.0000 mg | Freq: Once | INTRAVENOUS | Status: AC
  Administered 2013-03-30: 20 mg via INTRAVENOUS

## 2013-03-30 MED ORDER — DEXAMETHASONE SODIUM PHOSPHATE 20 MG/5ML IJ SOLN
20.0000 mg | Freq: Once | INTRAMUSCULAR | Status: AC
  Administered 2013-03-30: 20 mg via INTRAVENOUS

## 2013-03-30 NOTE — Patient Instructions (Addendum)
[facility] Discharge Instructions for Patients Receiving Chemotherapy  Today you received the following chemotherapy agents:  Taxol  To help prevent nausea and vomiting after your treatment, we encourage you to take your nausea medication as ordered per MD.   If you develop nausea and vomiting that is not controlled by your nausea medication, call the clinic.   BELOW ARE SYMPTOMS THAT SHOULD BE REPORTED IMMEDIATELY:  *FEVER GREATER THAN 100.5 F  *CHILLS WITH OR WITHOUT FEVER  NAUSEA AND VOMITING THAT IS NOT CONTROLLED WITH YOUR NAUSEA MEDICATION  *UNUSUAL SHORTNESS OF BREATH  *UNUSUAL BRUISING OR BLEEDING  TENDERNESS IN MOUTH AND THROAT WITH OR WITHOUT PRESENCE OF ULCERS  *URINARY PROBLEMS  *BOWEL PROBLEMS  UNUSUAL RASH Items with * indicate a potential emergency and should be followed up as soon as possible.  Feel free to call the clinic you have any questions or concerns. The clinic phone number is [phone].    

## 2013-04-04 ENCOUNTER — Telehealth: Payer: Self-pay | Admitting: *Deleted

## 2013-04-04 DIAGNOSIS — C801 Malignant (primary) neoplasm, unspecified: Secondary | ICD-10-CM

## 2013-04-04 DIAGNOSIS — C561 Malignant neoplasm of right ovary: Secondary | ICD-10-CM

## 2013-04-04 DIAGNOSIS — R590 Localized enlarged lymph nodes: Secondary | ICD-10-CM

## 2013-04-04 MED ORDER — ONDANSETRON HCL 8 MG PO TABS
8.0000 mg | ORAL_TABLET | Freq: Two times a day (BID) | ORAL | Status: DC
Start: 2013-04-04 — End: 2013-07-12

## 2013-04-04 MED ORDER — ONDANSETRON HCL 8 MG PO TABS: 8.0000 mg | ORAL_TABLET | Freq: Two times a day (BID) | ORAL | Status: DC

## 2013-04-04 NOTE — Telephone Encounter (Signed)
Pt requests refill on Zofran.  States taking about 6 pills weekly after chemo.  Refill sent.

## 2013-04-06 ENCOUNTER — Other Ambulatory Visit (HOSPITAL_BASED_OUTPATIENT_CLINIC_OR_DEPARTMENT_OTHER): Payer: Commercial Managed Care - PPO | Admitting: Lab

## 2013-04-06 ENCOUNTER — Ambulatory Visit (HOSPITAL_BASED_OUTPATIENT_CLINIC_OR_DEPARTMENT_OTHER): Payer: Commercial Managed Care - PPO

## 2013-04-06 VITALS — BP 151/81 | HR 89 | Temp 96.8°F

## 2013-04-06 DIAGNOSIS — C779 Secondary and unspecified malignant neoplasm of lymph node, unspecified: Secondary | ICD-10-CM

## 2013-04-06 DIAGNOSIS — C561 Malignant neoplasm of right ovary: Secondary | ICD-10-CM

## 2013-04-06 DIAGNOSIS — C569 Malignant neoplasm of unspecified ovary: Secondary | ICD-10-CM

## 2013-04-06 DIAGNOSIS — C801 Malignant (primary) neoplasm, unspecified: Secondary | ICD-10-CM

## 2013-04-06 DIAGNOSIS — Z5111 Encounter for antineoplastic chemotherapy: Secondary | ICD-10-CM

## 2013-04-06 LAB — CBC WITH DIFFERENTIAL/PLATELET
Basophils Absolute: 0 10*3/uL (ref 0.0–0.1)
EOS%: 0.8 % (ref 0.0–7.0)
Eosinophils Absolute: 0 10*3/uL (ref 0.0–0.5)
HCT: 36.5 % (ref 34.8–46.6)
HGB: 12.4 g/dL (ref 11.6–15.9)
MCH: 31.3 pg (ref 25.1–34.0)
MONO#: 0.6 10*3/uL (ref 0.1–0.9)
NEUT#: 2.8 10*3/uL (ref 1.5–6.5)
NEUT%: 52.3 % (ref 38.4–76.8)
lymph#: 1.8 10*3/uL (ref 0.9–3.3)

## 2013-04-06 MED ORDER — DIPHENHYDRAMINE HCL 50 MG/ML IJ SOLN
50.0000 mg | Freq: Once | INTRAMUSCULAR | Status: AC
  Administered 2013-04-06: 50 mg via INTRAVENOUS

## 2013-04-06 MED ORDER — HEPARIN SOD (PORK) LOCK FLUSH 100 UNIT/ML IV SOLN
500.0000 [IU] | Freq: Once | INTRAVENOUS | Status: AC | PRN
  Administered 2013-04-06: 500 [IU]
  Filled 2013-04-06: qty 5

## 2013-04-06 MED ORDER — SODIUM CHLORIDE 0.9 % IV SOLN
Freq: Once | INTRAVENOUS | Status: AC
  Administered 2013-04-06: 11:00:00 via INTRAVENOUS

## 2013-04-06 MED ORDER — FAMOTIDINE IN NACL 20-0.9 MG/50ML-% IV SOLN
20.0000 mg | Freq: Once | INTRAVENOUS | Status: AC
  Administered 2013-04-06: 20 mg via INTRAVENOUS

## 2013-04-06 MED ORDER — DEXAMETHASONE SODIUM PHOSPHATE 20 MG/5ML IJ SOLN
20.0000 mg | Freq: Once | INTRAMUSCULAR | Status: AC
  Administered 2013-04-06: 20 mg via INTRAVENOUS

## 2013-04-06 MED ORDER — SODIUM CHLORIDE 0.9 % IV SOLN
80.0000 mg/m2 | Freq: Once | INTRAVENOUS | Status: AC
  Administered 2013-04-06: 132 mg via INTRAVENOUS
  Filled 2013-04-06: qty 22

## 2013-04-06 MED ORDER — ONDANSETRON 8 MG/50ML IVPB (CHCC)
8.0000 mg | Freq: Once | INTRAVENOUS | Status: AC
  Administered 2013-04-06: 8 mg via INTRAVENOUS

## 2013-04-06 MED ORDER — SODIUM CHLORIDE 0.9 % IJ SOLN
10.0000 mL | INTRAMUSCULAR | Status: DC | PRN
  Administered 2013-04-06: 10 mL
  Filled 2013-04-06: qty 10

## 2013-04-06 NOTE — Patient Instructions (Signed)
[facility] Discharge Instructions for Patients Receiving Chemotherapy  Today you received the following chemotherapy agents: Taxol.  To help prevent nausea and vomiting after your treatment, we encourage you to take your nausea medication as prescribed.   If you develop nausea and vomiting that is not controlled by your nausea medication, call the clinic.   BELOW ARE SYMPTOMS THAT SHOULD BE REPORTED IMMEDIATELY:  *FEVER GREATER THAN 100.5 F  *CHILLS WITH OR WITHOUT FEVER  NAUSEA AND VOMITING THAT IS NOT CONTROLLED WITH YOUR NAUSEA MEDICATION  *UNUSUAL SHORTNESS OF BREATH  *UNUSUAL BRUISING OR BLEEDING  TENDERNESS IN MOUTH AND THROAT WITH OR WITHOUT PRESENCE OF ULCERS  *URINARY PROBLEMS  *BOWEL PROBLEMS  UNUSUAL RASH Items with * indicate a potential emergency and should be followed up as soon as possible.  Feel free to call the clinic you have any questions or concerns. The clinic phone number is [phone].    

## 2013-04-06 NOTE — Progress Notes (Signed)
Patient's daughter and husband is picking her up as she received 50 mg Benadryl IV.

## 2013-04-07 ENCOUNTER — Ambulatory Visit: Payer: Self-pay | Admitting: Specialist

## 2013-04-11 NOTE — Progress Notes (Signed)
Had individual counseling session with Pt. On 04-07-13.   Kandy Garrison, M.Ed., NCC

## 2013-04-13 ENCOUNTER — Other Ambulatory Visit (HOSPITAL_BASED_OUTPATIENT_CLINIC_OR_DEPARTMENT_OTHER): Payer: Commercial Managed Care - PPO | Admitting: Lab

## 2013-04-13 ENCOUNTER — Telehealth: Payer: Self-pay | Admitting: *Deleted

## 2013-04-13 ENCOUNTER — Ambulatory Visit (HOSPITAL_BASED_OUTPATIENT_CLINIC_OR_DEPARTMENT_OTHER): Payer: Commercial Managed Care - PPO

## 2013-04-13 ENCOUNTER — Ambulatory Visit (HOSPITAL_BASED_OUTPATIENT_CLINIC_OR_DEPARTMENT_OTHER): Payer: Commercial Managed Care - PPO | Admitting: Oncology

## 2013-04-13 ENCOUNTER — Ambulatory Visit: Payer: Commercial Managed Care - PPO | Admitting: Nutrition

## 2013-04-13 ENCOUNTER — Telehealth: Payer: Self-pay | Admitting: Oncology

## 2013-04-13 VITALS — BP 132/85 | HR 98 | Temp 98.5°F | Resp 18 | Ht 66.0 in | Wt 129.8 lb

## 2013-04-13 DIAGNOSIS — F341 Dysthymic disorder: Secondary | ICD-10-CM

## 2013-04-13 DIAGNOSIS — C561 Malignant neoplasm of right ovary: Secondary | ICD-10-CM

## 2013-04-13 DIAGNOSIS — C569 Malignant neoplasm of unspecified ovary: Secondary | ICD-10-CM

## 2013-04-13 DIAGNOSIS — C779 Secondary and unspecified malignant neoplasm of lymph node, unspecified: Secondary | ICD-10-CM

## 2013-04-13 DIAGNOSIS — C801 Malignant (primary) neoplasm, unspecified: Secondary | ICD-10-CM

## 2013-04-13 DIAGNOSIS — Z5111 Encounter for antineoplastic chemotherapy: Secondary | ICD-10-CM

## 2013-04-13 LAB — CBC WITH DIFFERENTIAL/PLATELET
BASO%: 1 % (ref 0.0–2.0)
LYMPH%: 33.2 % (ref 14.0–49.7)
MCHC: 33.9 g/dL (ref 31.5–36.0)
MONO#: 0.5 10*3/uL (ref 0.1–0.9)
MONO%: 8.2 % (ref 0.0–14.0)
Platelets: 232 10*3/uL (ref 145–400)
RBC: 3.95 10*6/uL (ref 3.70–5.45)
RDW: 15.1 % — ABNORMAL HIGH (ref 11.2–14.5)
WBC: 5.8 10*3/uL (ref 3.9–10.3)
nRBC: 0 % (ref 0–0)

## 2013-04-13 LAB — COMPREHENSIVE METABOLIC PANEL (CC13)
ALT: 9 U/L (ref 0–55)
AST: 11 U/L (ref 5–34)
Chloride: 103 mEq/L (ref 98–109)
Creatinine: 0.7 mg/dL (ref 0.6–1.1)
Sodium: 138 mEq/L (ref 136–145)
Total Bilirubin: <0.2 mg/dL (ref 0.20–1.20)

## 2013-04-13 MED ORDER — SODIUM CHLORIDE 0.9 % IJ SOLN
10.0000 mL | INTRAMUSCULAR | Status: DC | PRN
  Administered 2013-04-13: 10 mL
  Filled 2013-04-13: qty 10

## 2013-04-13 MED ORDER — DEXAMETHASONE SODIUM PHOSPHATE 20 MG/5ML IJ SOLN
20.0000 mg | Freq: Once | INTRAMUSCULAR | Status: AC
  Administered 2013-04-13: 20 mg via INTRAVENOUS

## 2013-04-13 MED ORDER — FAMOTIDINE IN NACL 20-0.9 MG/50ML-% IV SOLN
20.0000 mg | Freq: Once | INTRAVENOUS | Status: AC
  Administered 2013-04-13: 20 mg via INTRAVENOUS

## 2013-04-13 MED ORDER — SODIUM CHLORIDE 0.9 % IV SOLN
650.0000 mg | Freq: Once | INTRAVENOUS | Status: AC
  Administered 2013-04-13: 650 mg via INTRAVENOUS
  Filled 2013-04-13: qty 65

## 2013-04-13 MED ORDER — SODIUM CHLORIDE 0.9 % IV SOLN
80.0000 mg/m2 | Freq: Once | INTRAVENOUS | Status: AC
  Administered 2013-04-13: 132 mg via INTRAVENOUS
  Filled 2013-04-13: qty 22

## 2013-04-13 MED ORDER — HEPARIN SOD (PORK) LOCK FLUSH 100 UNIT/ML IV SOLN
500.0000 [IU] | Freq: Once | INTRAVENOUS | Status: AC | PRN
  Administered 2013-04-13: 500 [IU]
  Filled 2013-04-13: qty 5

## 2013-04-13 MED ORDER — ONDANSETRON 16 MG/50ML IVPB (CHCC)
16.0000 mg | Freq: Once | INTRAVENOUS | Status: AC
  Administered 2013-04-13: 16 mg via INTRAVENOUS

## 2013-04-13 MED ORDER — SODIUM CHLORIDE 0.9 % IV SOLN
Freq: Once | INTRAVENOUS | Status: AC
  Administered 2013-04-13: 10:00:00 via INTRAVENOUS

## 2013-04-13 MED ORDER — DIPHENHYDRAMINE HCL 50 MG/ML IJ SOLN
50.0000 mg | Freq: Once | INTRAMUSCULAR | Status: AC
  Administered 2013-04-13: 50 mg via INTRAVENOUS

## 2013-04-13 NOTE — Progress Notes (Signed)
Patient is a 57 year old female diagnosed with metastatic ovarian cancer.  Past medical history includes postop nausea vomiting, MVP, hypertension, and anxiety.  Medications include Xanax, Zofran, and Compazine.  Labs include glucose of 100 on June 11.  Height: 66 inches. Weight: 129.8 pounds July 2.  Usual body weight: 144 pounds June of 2012. BMI: 20.96.  Patient reports she is eating well.  She denies nutrition side effects.  She states weight loss since 2012, is the result of diet and exercise.  Nutrition diagnosis: Food and nutrition related knowledge deficit related to diagnosis of metastatic ovarian cancer as evidenced by no prior need for nutrition related information.  Intervention: Educated patient to consume small, frequent meals with high-calorie, high-protein foods.  I have reviewed appropriate, high-protein foods with patient.  I have encouraged her to try cold foods for better tolerance.  I have educated her on strategies for dealing with constipation.  Fact sheets were provided.  Questions were answered.  Teach back method used.  Monitoring, evaluation, goals: Patient will tolerate oral diet to maintain weight throughout treatment.  Next visit: Wednesday July 16, during chemotherapy.

## 2013-04-13 NOTE — Telephone Encounter (Signed)
Gave pt appt for lab, and chemo and oral contrast, printed AVS, will call regarding MD appt, no template availabe right noe for MD

## 2013-04-13 NOTE — Progress Notes (Signed)
[facility]  [phone] [phone]   OFFICE PROGRESS NOTE   Cc:  Deborah Bravo, NP  DIAGNOSIS:  Metastatic ovarian cancer.   PAST THERAPY:  Biopsy only.   CURRENT THERAPY: started neoadjuvant Carboplatin d1; Taxol d1,8,15 every 3 weeks on 02/09/2013.  Goal to is to be seen by Dr. Nelly Macdonald [facility]) after about 4 cycles of chemo to see if she is a resectable candidate.   INTERVAL HISTORY: Deborah Macdonald 56 y.o. female returns for regular follow up by herself.  She has mild right lower abdominal cramp.  The discomfort is mild without radiation.  There is no relation with foods/bowel movement/ activities.  She does not require pain meds.  She has good days and bad days with her anxiety/depressant.  She is seeing a Veterinary surgeon.  She does not want to take anti depressant.   Patient denies fever, anorexia, weight loss, fatigue, headache, visual changes, confusion, drenching night sweats, palpable lymph node swelling, mucositis, odynophagia, dysphagia, nausea vomiting, jaundice, chest pain, palpitation, shortness of breath, dyspnea on exertion, productive cough, gum bleeding, epistaxis, hematemesis, hemoptysis, abdominal swelling, early satiety, melena, hematochezia, hematuria, skin rash, spontaneous bleeding, joint swelling, joint pain, heat or cold intolerance, bowel bladder incontinence, back pain, focal motor weakness, paresthesia.     Past Medical History  Diagnosis Date  . PONV (postoperative nausea and vomiting)   . MVP (mitral valve prolapse)   . Hypertension   . Anxiety   . Shortness of breath     at times "anxiety"  . Ovarian cancer on right   . Regional lymph node metastasis present     Past Surgical History  Procedure Laterality Date  . Wisdom tooth extraction    . Lymph node biopsy Left 01/21/2013    Procedure: EXCISIONAL BIOPSY OF THE NECK;  Surgeon: Serena Colonel, MD;  Location: [facility] OR;  Service: ENT;  Laterality: Left;  . Leep      20  year ago    Current Outpatient Prescriptions  Medication Sig Dispense Refill  . ALPRAZolam (XANAX) 0.5 MG tablet Take 0.5 mg by mouth 3 (three) times daily.      . Aspirin-Salicylamide-Caffeine (BC HEADACHE POWDER PO) Take 1 packet by mouth every 6 (six) hours as needed (headache).      . lidocaine-prilocaine (EMLA) cream Apply topically as needed. Apply to skin over Porta Cath site at least one hour prior to needle stick  30 g  3  . loratadine (CLARITIN) 10 MG tablet Take 10 mg by mouth daily.      . ondansetron (ZOFRAN) 8 MG tablet Take 1 tablet (8 mg total) by mouth 2 (two) times daily. Twice daily starting the day after chemo for 3 days then as needed.  30 tablet  3  . prochlorperazine (COMPAZINE) 10 MG tablet Take 1 tablet (10 mg total) by mouth every 6 (six) hours as needed (Nausea or vomiting).  30 tablet  1  . triamterene-hydrochlorothiazide (MAXZIDE-25) 37.5-25 MG per tablet Take 1 tablet by mouth daily.      Marland Kitchen zolpidem (AMBIEN) 5 MG tablet Take 1 tablet (5 mg total) by mouth at bedtime as needed for sleep.  30 tablet  3   No current facility-administered medications for this visit.   Facility-Administered Medications Ordered in Other Visits  Medication Dose Route Frequency Provider Last Rate Last Dose  . sodium chloride 0.9 % injection 10 mL  10 mL Intracatheter PRN Exie Parody, MD   10 mL at 04/13/13 1258  ALLERGIES:  is allergic to codeine.  REVIEW OF SYSTEMS:  The rest of the 14-point review of system was negative.   Filed Vitals:   04/13/13 0839  BP: 132/85  Pulse: 98  Temp: 98.5 F (36.9 C)  Resp: 18   Wt Readings from Last 3 Encounters:  04/13/13 129 lb 12.8 oz (58.877 kg)  03/23/13 128 lb 14.4 oz (58.469 kg)  03/02/13 129 lb 12.8 oz (58.877 kg)   ECOG Performance status: 0  PHYSICAL EXAMINATION:   General:  well-nourished woman,  in no acute distress.  Eyes:  no scleral icterus.  ENT:  There were no oropharyngeal lesions.  Neck was without thyromegaly.   Lymphatics:  Negative cervical, supraclavicular or axillary adenopathy.  Respiratory: lungs were clear bilaterally without wheezing or crackles.  Cardiovascular:  Regular rate and rhythm, S1/S2, without murmur, rub or gallop.  There was no pedal edema.  GI:  abdomen was soft, flat, nontender, nondistended, without organomegaly.  Muscoloskeletal:  no spinal tenderness of palpation of vertebral spine.  Skin exam was without echymosis, petichae.  Neuro exam was nonfocal.  Patient was able to get on and off exam table without assistance.  Gait was normal.  Patient was alert and oriented.  Attention was good.   Language was appropriate.  Mood was normal without depression.  Speech was not pressured.  Thought content was not tangential.     LABORATORY/RADIOLOGY DATA:  Lab Results  Component Value Date   WBC 5.8 04/13/2013   HGB 12.5 04/13/2013   HCT 36.9 04/13/2013   PLT 232 04/13/2013   GLUCOSE 92 04/13/2013   ALKPHOS 67 04/13/2013   ALT 9 04/13/2013   AST 11 04/13/2013   NA 138 04/13/2013   K 4.2 04/13/2013   CL 106 03/23/2013   CREATININE 0.7 04/13/2013   BUN 19.8 04/13/2013   CO2 29 04/13/2013   INR 0.89 03/04/2013     ASSESSMENT AND PLAN:   1.  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   - She is doing well on chemo.  She has no dose limiting toxicity.  She has improved stamina and performance status.  Hopefully, these are signs of her disease responding to therapy. I advised her to continue with chemo without modification.  I recommended restaging CT chest/abd/pel late July/early August to assess response to therapy.  She is due to see GynOnc early Aug 2014.   2.  Anxiety/Depression:  Mood improved with counseling. She does not want to be on anti depressant.   3.  Follow up:  Weekly lab and chemo Taxol (carboplatin is only q3wk).  Return visit in about 3 weeks for the next cycle of chemo.   I informed [name] that I am leaving the practice.  [facility] will  arrange for her to see another provider when she returns.   The length of time of the face-to-face encounter was 15 minutes. More than 50% of time was spent counseling and coordination of care.     Tysen Roesler T. Gaylyn Rong, M.D.

## 2013-04-13 NOTE — Patient Instructions (Addendum)
[facility] Discharge Instructions for Patients Receiving Chemotherapy  Today you received the following chemotherapy agents TAXOL, CARBOPLATIN  To help prevent nausea and vomiting after your treatment, we encourage you to take your nausea medication if needed.   If you develop nausea and vomiting that is not controlled by your nausea medication, call the clinic.   BELOW ARE SYMPTOMS THAT SHOULD BE REPORTED IMMEDIATELY:  *FEVER GREATER THAN 100.5 F  *CHILLS WITH OR WITHOUT FEVER  NAUSEA AND VOMITING THAT IS NOT CONTROLLED WITH YOUR NAUSEA MEDICATION  *UNUSUAL SHORTNESS OF BREATH  *UNUSUAL BRUISING OR BLEEDING  TENDERNESS IN MOUTH AND THROAT WITH OR WITHOUT PRESENCE OF ULCERS  *URINARY PROBLEMS  *BOWEL PROBLEMS  UNUSUAL RASH Items with * indicate a potential emergency and should be followed up as soon as possible.  Feel free to call the clinic you have any questions or concerns. The clinic phone number is [phone].

## 2013-04-13 NOTE — Telephone Encounter (Signed)
Per staff message and POF I have scheduled appts.  [name]  

## 2013-04-20 ENCOUNTER — Telehealth: Payer: Self-pay | Admitting: *Deleted

## 2013-04-20 ENCOUNTER — Ambulatory Visit (HOSPITAL_BASED_OUTPATIENT_CLINIC_OR_DEPARTMENT_OTHER): Payer: Commercial Managed Care - PPO

## 2013-04-20 ENCOUNTER — Other Ambulatory Visit (HOSPITAL_BASED_OUTPATIENT_CLINIC_OR_DEPARTMENT_OTHER): Payer: Commercial Managed Care - PPO | Admitting: Lab

## 2013-04-20 VITALS — BP 125/73 | HR 93 | Temp 98.0°F | Resp 18 | Ht 66.0 in

## 2013-04-20 DIAGNOSIS — C561 Malignant neoplasm of right ovary: Secondary | ICD-10-CM

## 2013-04-20 DIAGNOSIS — C779 Secondary and unspecified malignant neoplasm of lymph node, unspecified: Secondary | ICD-10-CM

## 2013-04-20 DIAGNOSIS — C569 Malignant neoplasm of unspecified ovary: Secondary | ICD-10-CM

## 2013-04-20 DIAGNOSIS — C801 Malignant (primary) neoplasm, unspecified: Secondary | ICD-10-CM

## 2013-04-20 DIAGNOSIS — Z5111 Encounter for antineoplastic chemotherapy: Secondary | ICD-10-CM

## 2013-04-20 LAB — CBC WITH DIFFERENTIAL/PLATELET
Eosinophils Absolute: 0.1 10*3/uL (ref 0.0–0.5)
HCT: 35.8 % (ref 34.8–46.6)
LYMPH%: 30 % (ref 14.0–49.7)
MONO#: 0.4 10*3/uL (ref 0.1–0.9)
NEUT#: 2.9 10*3/uL (ref 1.5–6.5)
NEUT%: 61.1 % (ref 38.4–76.8)
Platelets: 216 10*3/uL (ref 145–400)
WBC: 4.8 10*3/uL (ref 3.9–10.3)
lymph#: 1.4 10*3/uL (ref 0.9–3.3)

## 2013-04-20 MED ORDER — FAMOTIDINE IN NACL 20-0.9 MG/50ML-% IV SOLN
20.0000 mg | Freq: Once | INTRAVENOUS | Status: AC
Start: 2013-04-20 — End: 2013-04-20
  Administered 2013-04-20: 20 mg via INTRAVENOUS

## 2013-04-20 MED ORDER — SODIUM CHLORIDE 0.9 % IV SOLN
80.0000 mg/m2 | Freq: Once | INTRAVENOUS | Status: AC
  Administered 2013-04-20: 132 mg via INTRAVENOUS
  Filled 2013-04-20: qty 22

## 2013-04-20 MED ORDER — DIPHENHYDRAMINE HCL 50 MG/ML IJ SOLN
50.0000 mg | Freq: Once | INTRAMUSCULAR | Status: AC
  Administered 2013-04-20: 50 mg via INTRAVENOUS

## 2013-04-20 MED ORDER — SODIUM CHLORIDE 0.9 % IV SOLN
Freq: Once | INTRAVENOUS | Status: AC
  Administered 2013-04-20: 09:00:00 via INTRAVENOUS

## 2013-04-20 MED ORDER — DEXAMETHASONE SODIUM PHOSPHATE 20 MG/5ML IJ SOLN
20.0000 mg | Freq: Once | INTRAMUSCULAR | Status: AC
  Administered 2013-04-20: 20 mg via INTRAVENOUS

## 2013-04-20 MED ORDER — ONDANSETRON 8 MG/50ML IVPB (CHCC)
8.0000 mg | Freq: Once | INTRAVENOUS | Status: AC
  Administered 2013-04-20: 8 mg via INTRAVENOUS

## 2013-04-20 MED ORDER — SODIUM CHLORIDE 0.9 % IJ SOLN
10.0000 mL | INTRAMUSCULAR | Status: DC | PRN
  Administered 2013-04-20: 10 mL
  Filled 2013-04-20: qty 10

## 2013-04-20 MED ORDER — HEPARIN SOD (PORK) LOCK FLUSH 100 UNIT/ML IV SOLN
500.0000 [IU] | Freq: Once | INTRAVENOUS | Status: AC | PRN
  Administered 2013-04-20: 500 [IU]
  Filled 2013-04-20: qty 5

## 2013-04-20 NOTE — Telephone Encounter (Signed)
Pt had questions about her upcoming scans and why they are being done.  Informed pt it is to check her response to the chemotherapy.  She verbalized understanding.

## 2013-04-20 NOTE — Telephone Encounter (Signed)
That is OK.  Thanks.  No need to reschedule.  Just skip and resume visit/chemo as previously scheduled.

## 2013-04-20 NOTE — Patient Instructions (Addendum)
[facility] Discharge Instructions for Patients Receiving Chemotherapy  Today you received the following chemotherapy agents: Taxol.  To help prevent nausea and vomiting after your treatment, we encourage you to take your nausea medication as prescribed.   If you develop nausea and vomiting that is not controlled by your nausea medication, call the clinic.   BELOW ARE SYMPTOMS THAT SHOULD BE REPORTED IMMEDIATELY:  *FEVER GREATER THAN 100.5 F  *CHILLS WITH OR WITHOUT FEVER  NAUSEA AND VOMITING THAT IS NOT CONTROLLED WITH YOUR NAUSEA MEDICATION  *UNUSUAL SHORTNESS OF BREATH  *UNUSUAL BRUISING OR BLEEDING  TENDERNESS IN MOUTH AND THROAT WITH OR WITHOUT PRESENCE OF ULCERS  *URINARY PROBLEMS  *BOWEL PROBLEMS  UNUSUAL RASH Items with * indicate a potential emergency and should be followed up as soon as possible.  Feel free to call the clinic you have any questions or concerns. The clinic phone number is [phone].    

## 2013-04-20 NOTE — Telephone Encounter (Signed)
Pt states wants to skip her Taxol on 05/04/13.  She is going to go on Vacation.  Ok to cancel this appt and resume the following week w/ MD visit?

## 2013-04-21 ENCOUNTER — Ambulatory Visit: Payer: Self-pay | Admitting: Specialist

## 2013-04-21 ENCOUNTER — Telehealth: Payer: Self-pay | Admitting: Oncology

## 2013-04-21 NOTE — Telephone Encounter (Signed)
, °

## 2013-04-21 NOTE — Progress Notes (Signed)
Met with Pt for individual counseling on 04-21-13  -Natia Fahmy, M.Ed., LPCA, NCC

## 2013-04-27 ENCOUNTER — Ambulatory Visit: Payer: Commercial Managed Care - PPO | Admitting: Nutrition

## 2013-04-27 ENCOUNTER — Other Ambulatory Visit (HOSPITAL_BASED_OUTPATIENT_CLINIC_OR_DEPARTMENT_OTHER): Payer: Commercial Managed Care - PPO | Admitting: Lab

## 2013-04-27 ENCOUNTER — Ambulatory Visit (HOSPITAL_BASED_OUTPATIENT_CLINIC_OR_DEPARTMENT_OTHER): Payer: Commercial Managed Care - PPO

## 2013-04-27 VITALS — BP 152/87 | HR 85 | Temp 97.1°F | Resp 16

## 2013-04-27 DIAGNOSIS — C801 Malignant (primary) neoplasm, unspecified: Secondary | ICD-10-CM

## 2013-04-27 DIAGNOSIS — C569 Malignant neoplasm of unspecified ovary: Secondary | ICD-10-CM

## 2013-04-27 DIAGNOSIS — C779 Secondary and unspecified malignant neoplasm of lymph node, unspecified: Secondary | ICD-10-CM

## 2013-04-27 DIAGNOSIS — Z5111 Encounter for antineoplastic chemotherapy: Secondary | ICD-10-CM

## 2013-04-27 DIAGNOSIS — C561 Malignant neoplasm of right ovary: Secondary | ICD-10-CM

## 2013-04-27 LAB — CBC WITH DIFFERENTIAL/PLATELET
Basophils Absolute: 0 10*3/uL (ref 0.0–0.1)
Eosinophils Absolute: 0.1 10*3/uL (ref 0.0–0.5)
HGB: 12.1 g/dL (ref 11.6–15.9)
MCV: 95.2 fL (ref 79.5–101.0)
MONO#: 0.4 10*3/uL (ref 0.1–0.9)
MONO%: 9.7 % (ref 0.0–14.0)
NEUT#: 2.3 10*3/uL (ref 1.5–6.5)
Platelets: 271 10*3/uL (ref 145–400)
RDW: 15.3 % — ABNORMAL HIGH (ref 11.2–14.5)
WBC: 4.5 10*3/uL (ref 3.9–10.3)

## 2013-04-27 MED ORDER — DIPHENHYDRAMINE HCL 50 MG/ML IJ SOLN
50.0000 mg | Freq: Once | INTRAMUSCULAR | Status: AC
  Administered 2013-04-27: 50 mg via INTRAVENOUS

## 2013-04-27 MED ORDER — SODIUM CHLORIDE 0.9 % IV SOLN
50.0000 mg | Freq: Once | INTRAVENOUS | Status: AC
  Administered 2013-04-27: 50 mg via INTRAVENOUS
  Filled 2013-04-27: qty 2

## 2013-04-27 MED ORDER — HEPARIN SOD (PORK) LOCK FLUSH 100 UNIT/ML IV SOLN
500.0000 [IU] | Freq: Once | INTRAVENOUS | Status: AC | PRN
  Administered 2013-04-27: 500 [IU]
  Filled 2013-04-27: qty 5

## 2013-04-27 MED ORDER — SODIUM CHLORIDE 0.9 % IV SOLN
Freq: Once | INTRAVENOUS | Status: AC
  Administered 2013-04-27: 08:00:00 via INTRAVENOUS

## 2013-04-27 MED ORDER — DEXAMETHASONE SODIUM PHOSPHATE 20 MG/5ML IJ SOLN
20.0000 mg | Freq: Once | INTRAMUSCULAR | Status: AC
  Administered 2013-04-27: 20 mg via INTRAVENOUS

## 2013-04-27 MED ORDER — SODIUM CHLORIDE 0.9 % IJ SOLN
10.0000 mL | INTRAMUSCULAR | Status: DC | PRN
  Administered 2013-04-27: 10 mL
  Filled 2013-04-27: qty 10

## 2013-04-27 MED ORDER — FAMOTIDINE IN NACL 20-0.9 MG/50ML-% IV SOLN: 20.0000 mg | Freq: Once | INTRAVENOUS | Status: DC

## 2013-04-27 MED ORDER — ONDANSETRON 8 MG/50ML IVPB (CHCC)
8.0000 mg | Freq: Once | INTRAVENOUS | Status: AC
  Administered 2013-04-27: 8 mg via INTRAVENOUS

## 2013-04-27 MED ORDER — SODIUM CHLORIDE 0.9 % IV SOLN
80.0000 mg/m2 | Freq: Once | INTRAVENOUS | Status: AC
  Administered 2013-04-27: 132 mg via INTRAVENOUS
  Filled 2013-04-27: qty 22

## 2013-04-27 NOTE — Patient Instructions (Addendum)
[facility] Discharge Instructions for Patients Receiving Chemotherapy  Today you received the following chemotherapy agents: Taxol.  To help prevent nausea and vomiting after your treatment, we encourage you to take your nausea medication as prescribed.   If you develop nausea and vomiting that is not controlled by your nausea medication, call the clinic.   BELOW ARE SYMPTOMS THAT SHOULD BE REPORTED IMMEDIATELY:  *FEVER GREATER THAN 100.5 F  *CHILLS WITH OR WITHOUT FEVER  NAUSEA AND VOMITING THAT IS NOT CONTROLLED WITH YOUR NAUSEA MEDICATION  *UNUSUAL SHORTNESS OF BREATH  *UNUSUAL BRUISING OR BLEEDING  TENDERNESS IN MOUTH AND THROAT WITH OR WITHOUT PRESENCE OF ULCERS  *URINARY PROBLEMS  *BOWEL PROBLEMS  UNUSUAL RASH Items with * indicate a potential emergency and should be followed up as soon as possible.  Feel free to call the clinic you have any questions or concerns. The clinic phone number is [phone].    

## 2013-04-27 NOTE — Progress Notes (Signed)
Followed up with Pt during chemo.  Pt states she is trying to eat well and frequently, but c/o changes in taste.  Pt does not like any types of supplements, but does drink 2 glasses of whole milk/day.  Wt 04/13/13 129.8# no new wt, but pt thinks she [place] have lost a couple more pounds.  Pt seems willing to supplement with milkshakes.  Nutrition Dx: Inadequate oral intake related to diagnosis of metastatic ovarian Ca as evidenced by decrease po intake and some reported wt loss.    Intervention:  Pt encouraged to continue consuming small frequent high kcal/high protein meals.  Recommend pt begin drinking 1 milk shake/day made with ice cream and whole milk.    Monitoring, evaluation, goals:  Pt will tolerate increased kcal and protein to maintain wt throughout tx.   Next Visit: To be scheduled.

## 2013-04-28 ENCOUNTER — Ambulatory Visit: Payer: Self-pay | Admitting: Specialist

## 2013-04-28 ENCOUNTER — Other Ambulatory Visit: Payer: Self-pay | Admitting: Certified Registered Nurse Anesthetist

## 2013-04-29 NOTE — Progress Notes (Signed)
MMet with Pt for last individual counseling on 04/28/13. Pt reports doing fairly well, and is adjusting to emotional challenges related to cancer treatment.   -Kandy Garrison, M.Ed., LPCA, NCC

## 2013-05-04 ENCOUNTER — Other Ambulatory Visit: Payer: Commercial Managed Care - PPO | Admitting: Lab

## 2013-05-04 ENCOUNTER — Ambulatory Visit: Payer: Commercial Managed Care - PPO

## 2013-05-09 ENCOUNTER — Ambulatory Visit (HOSPITAL_COMMUNITY)
Admission: RE | Admit: 2013-05-09 | Discharge: 2013-05-09 | Disposition: A | Discharge status: Home / Self Care | Payer: Commercial Managed Care - PPO | Source: Ambulatory Visit | Attending: Oncology | Admitting: Oncology

## 2013-05-09 DIAGNOSIS — R599 Enlarged lymph nodes, unspecified: Secondary | ICD-10-CM | POA: Insufficient documentation

## 2013-05-09 DIAGNOSIS — C561 Malignant neoplasm of right ovary: Secondary | ICD-10-CM

## 2013-05-09 DIAGNOSIS — Z79899 Other long term (current) drug therapy: Secondary | ICD-10-CM | POA: Insufficient documentation

## 2013-05-09 DIAGNOSIS — C569 Malignant neoplasm of unspecified ovary: Secondary | ICD-10-CM | POA: Insufficient documentation

## 2013-05-09 DIAGNOSIS — K8020 Calculus of gallbladder without cholecystitis without obstruction: Secondary | ICD-10-CM | POA: Insufficient documentation

## 2013-05-09 MED ORDER — IOHEXOL 300 MG/ML  SOLN
100.0000 mL | Freq: Once | INTRAMUSCULAR | Status: AC | PRN
  Administered 2013-05-09: 100 mL via INTRAVENOUS

## 2013-05-11 ENCOUNTER — Other Ambulatory Visit (HOSPITAL_BASED_OUTPATIENT_CLINIC_OR_DEPARTMENT_OTHER): Payer: Commercial Managed Care - PPO | Admitting: Lab

## 2013-05-11 ENCOUNTER — Other Ambulatory Visit: Payer: Commercial Managed Care - PPO | Admitting: Lab

## 2013-05-11 ENCOUNTER — Ambulatory Visit (HOSPITAL_BASED_OUTPATIENT_CLINIC_OR_DEPARTMENT_OTHER): Payer: Commercial Managed Care - PPO

## 2013-05-11 ENCOUNTER — Ambulatory Visit: Payer: Commercial Managed Care - PPO | Admitting: Nutrition

## 2013-05-11 ENCOUNTER — Ambulatory Visit (HOSPITAL_BASED_OUTPATIENT_CLINIC_OR_DEPARTMENT_OTHER): Payer: Commercial Managed Care - PPO | Admitting: Hematology and Oncology

## 2013-05-11 VITALS — BP 143/95 | HR 115 | Temp 98.2°F | Resp 19 | Ht 66.0 in | Wt 131.5 lb

## 2013-05-11 DIAGNOSIS — C779 Secondary and unspecified malignant neoplasm of lymph node, unspecified: Secondary | ICD-10-CM

## 2013-05-11 DIAGNOSIS — C569 Malignant neoplasm of unspecified ovary: Secondary | ICD-10-CM

## 2013-05-11 DIAGNOSIS — C561 Malignant neoplasm of right ovary: Secondary | ICD-10-CM

## 2013-05-11 DIAGNOSIS — Z5111 Encounter for antineoplastic chemotherapy: Secondary | ICD-10-CM

## 2013-05-11 LAB — COMPREHENSIVE METABOLIC PANEL (CC13)
ALT: 8 U/L (ref 0–55)
Albumin: 4 g/dL (ref 3.5–5.0)
CO2: 25 mEq/L (ref 22–29)
Calcium: 9.8 mg/dL (ref 8.4–10.4)
Chloride: 103 mEq/L (ref 98–109)
Creatinine: 0.7 mg/dL (ref 0.6–1.1)
Sodium: 138 mEq/L (ref 136–145)
Total Protein: 7.2 g/dL (ref 6.4–8.3)

## 2013-05-11 LAB — CBC WITH DIFFERENTIAL/PLATELET
Basophils Absolute: 0 10*3/uL (ref 0.0–0.1)
HCT: 37.9 % (ref 34.8–46.6)
HGB: 12.7 g/dL (ref 11.6–15.9)
MCH: 32.7 pg (ref 25.1–34.0)
MCHC: 33.5 g/dL (ref 31.5–36.0)
MCV: 97.7 fL (ref 79.5–101.0)
NEUT%: 62 % (ref 38.4–76.8)
RDW: 15.1 % — ABNORMAL HIGH (ref 11.2–14.5)
lymph#: 1.5 10*3/uL (ref 0.9–3.3)
nRBC: 0 % (ref 0–0)

## 2013-05-11 LAB — CA 125: CA 125: 39.9 U/mL — ABNORMAL HIGH (ref 0.0–30.2)

## 2013-05-11 MED ORDER — DIPHENHYDRAMINE HCL 50 MG/ML IJ SOLN
50.0000 mg | Freq: Once | INTRAMUSCULAR | Status: AC
  Administered 2013-05-11: 50 mg via INTRAVENOUS

## 2013-05-11 MED ORDER — SODIUM CHLORIDE 0.9 % IV SOLN
Freq: Once | INTRAVENOUS | Status: AC
  Administered 2013-05-11: 12:00:00 via INTRAVENOUS

## 2013-05-11 MED ORDER — SODIUM CHLORIDE 0.9 % IJ SOLN
10.0000 mL | INTRAMUSCULAR | Status: DC | PRN
  Administered 2013-05-11: 10 mL
  Filled 2013-05-11: qty 10

## 2013-05-11 MED ORDER — HEPARIN SOD (PORK) LOCK FLUSH 100 UNIT/ML IV SOLN
500.0000 [IU] | Freq: Once | INTRAVENOUS | Status: AC | PRN
  Administered 2013-05-11: 500 [IU]
  Filled 2013-05-11: qty 5

## 2013-05-11 MED ORDER — DEXAMETHASONE SODIUM PHOSPHATE 20 MG/5ML IJ SOLN
20.0000 mg | Freq: Once | INTRAMUSCULAR | Status: AC
  Administered 2013-05-11: 20 mg via INTRAVENOUS

## 2013-05-11 MED ORDER — ONDANSETRON 16 MG/50ML IVPB (CHCC)
16.0000 mg | Freq: Once | INTRAVENOUS | Status: AC
  Administered 2013-05-11: 16 mg via INTRAVENOUS

## 2013-05-11 MED ORDER — FAMOTIDINE IN NACL 20-0.9 MG/50ML-% IV SOLN
20.0000 mg | Freq: Once | INTRAVENOUS | Status: AC
  Administered 2013-05-11: 20 mg via INTRAVENOUS

## 2013-05-11 MED ORDER — PACLITAXEL CHEMO INJECTION 300 MG/50ML
80.0000 mg/m2 | Freq: Once | INTRAVENOUS | Status: AC
  Administered 2013-05-11: 132 mg via INTRAVENOUS
  Filled 2013-05-11: qty 22

## 2013-05-11 MED ORDER — SODIUM CHLORIDE 0.9 % IV SOLN
651.6000 mg | Freq: Once | INTRAVENOUS | Status: AC
  Administered 2013-05-11: 650 mg via INTRAVENOUS
  Filled 2013-05-11: qty 65

## 2013-05-11 NOTE — Patient Instructions (Addendum)
[facility] Discharge Instructions for Patients Receiving Chemotherapy  Today you received the following chemotherapy agents: Taxol, Carboplatin.  To help prevent nausea and vomiting after your treatment, we encourage you to take your nausea medication.   If you develop nausea and vomiting that is not controlled by your nausea medication, call the clinic.   BELOW ARE SYMPTOMS THAT SHOULD BE REPORTED IMMEDIATELY:  *FEVER GREATER THAN 100.5 F  *CHILLS WITH OR WITHOUT FEVER  NAUSEA AND VOMITING THAT IS NOT CONTROLLED WITH YOUR NAUSEA MEDICATION  *UNUSUAL SHORTNESS OF BREATH  *UNUSUAL BRUISING OR BLEEDING  TENDERNESS IN MOUTH AND THROAT WITH OR WITHOUT PRESENCE OF ULCERS  *URINARY PROBLEMS  *BOWEL PROBLEMS  UNUSUAL RASH Items with * indicate a potential emergency and should be followed up as soon as possible.  Feel free to call the clinic you have any questions or concerns. The clinic phone number is [phone].    

## 2013-05-11 NOTE — Progress Notes (Signed)
Patient reports she is doing well.  She has been off chemotherapy for one week and was able to go to the beach.  She states that she ate well while she was on vacation.  Her weight increased to 131.5 pounds July 30 from 129.8 pounds July 2.  Patient has no nutrition concerns at this time.  Nutrition diagnosis: Inadequate oral intake improved.  Intervention: Patient was encouraged to continue consuming small, frequent meals with high-calorie, high-protein foods.  She will continue milk or milk shakes as desired to maintain present weight/promote weight gain.  Monitoring, evaluation, goals: Patient has been able to increase calories and protein, and has experienced approximately 2 pound weight gain.  Next visit: No followup is scheduled at this time.  Patient has my phone number for questions and concerns.

## 2013-05-13 ENCOUNTER — Other Ambulatory Visit: Payer: Self-pay | Admitting: *Deleted

## 2013-05-13 ENCOUNTER — Telehealth: Payer: Self-pay | Admitting: *Deleted

## 2013-05-13 NOTE — Telephone Encounter (Signed)
Pt called for her schedule for Wed 8/08.  Informed her the order has been sent to scheduling but not scheduled yet.  Asked her to call back on Monday to check if schedule has been made yet. She verbalized understanding.

## 2013-05-13 NOTE — Telephone Encounter (Signed)
sw pt gv appt for 05/18/13 @10am  for labs and tx to follow. i emailed MB to add the tx's. Pt stated she will get a printout of her appts on 05/18/13....td

## 2013-05-15 NOTE — Progress Notes (Signed)
ID: Deborah Macdonald OB: 10-18-1955  MR#: [id number]  WUJ#:[id number]  PCP: Primus Bravo, NP  DIAGNOSIS: Metastatic ovarian cancer.   PAST THERAPY: Biopsy only.   CURRENT THERAPY: started neoadjuvant Carboplatin d1; Taxol d1,8,15 every 3 weeks on 02/09/2013. Goal to is to be seen by Dr. [name] [facility]) in August to see if she is a resectable candidate.   INTERVAL HISTORY:  Deborah Macdonald returns for regular follow up. She has mild right lower abdominal cramp. She does not require pain meds. She has good days and bad days with her anxiety/depressant. She is seeing a Veterinary surgeon. She does not want to take anti depressant.  Patient denies fever, anorexia, weight loss, fatigue, headache, visual changes, confusion, drenching night sweats, palpable lymph node swelling, mucositis, odynophagia, dysphagia, nausea vomiting, jaundice, chest pain, palpitation, shortness of breath, dyspnea on exertion, productive cough, gum bleeding, epistaxis, hematemesis, hemoptysis, abdominal swelling, early satiety, melena, hematochezia, hematuria, skin rash, spontaneous bleeding, joint swelling, joint pain, heat or cold intolerance, bowel bladder incontinence, back pain, focal motor weakness, paresthesia.   REVIEW OF SYSTEMS: The patient denied fever, chills, night sweats. Her appetite is good. She gain weight. She denied headaches, double vision, blurry vision, nasal congestion, nasal discharge, hearing problems, odynophagia or dysphagia. No chest pain, palpitations, dyspnea, cough, abdominal pain, nausea, vomiting, diarrhea, hematochezia. For her constipation stool softener is helpful. The patient denied dysuria, nocturia, polyuria, hematuria, myalgia, numbness, tingling. Depression under control  PAST MEDICAL HISTORY: Past Medical History  Diagnosis Date  . PONV (postoperative nausea and vomiting)   . MVP (mitral valve prolapse)   . Hypertension   . Anxiety   . Shortness of breath     at times "anxiety"   . Ovarian cancer on right   . Regional lymph node metastasis present     PAST SURGICAL HISTORY: Past Surgical History  Procedure Laterality Date  . Wisdom tooth extraction    . Lymph node biopsy Left 01/21/2013    Procedure: EXCISIONAL BIOPSY OF THE NECK;  Surgeon: Serena Colonel, MD;  Location: [facility] OR;  Service: ENT;  Laterality: Left;  . Leep      20 year ago    FAMILY HISTORY Family History  Problem Relation Age of Onset  . Heart attack Father   . Melanoma Maternal Uncle     dx in his 21s; mets to brain  . Dementia Maternal Grandmother   . COPD Maternal Grandfather   . Dementia Paternal Grandmother   . COPD Paternal Grandfather   . Testicular cancer Son 18    HEALTH MAINTENANCE: History  Substance Use Topics  . Smoking status: Former Smoker -- 1.00 packs/day for 39 years    Quit date: 01/13/2013  . Smokeless tobacco: Never Used  . Alcohol Use: 1.0 oz/week    2 drink(s) per week    Allergies  Allergen Reactions  . Codeine Nausea Only    Current Outpatient Prescriptions  Medication Sig Dispense Refill  . ALPRAZolam (XANAX) 0.5 MG tablet Take 0.5 mg by mouth 3 (three) times daily.      . Aspirin-Salicylamide-Caffeine (BC HEADACHE POWDER PO) Take 1 packet by mouth every 6 (six) hours as needed (headache).      . escitalopram (LEXAPRO) 5 MG tablet Take 5 mg by mouth daily.      Marland Kitchen lidocaine-prilocaine (EMLA) cream Apply topically as needed. Apply to skin over Porta Cath site at least one hour prior to needle stick  30 g  3  . loratadine (CLARITIN)  10 MG tablet Take 10 mg by mouth daily.      . ondansetron (ZOFRAN) 8 MG tablet Take 1 tablet (8 mg total) by mouth 2 (two) times daily. Twice daily starting the day after chemo for 3 days then as needed.  30 tablet  3  . prochlorperazine (COMPAZINE) 10 MG tablet Take 1 tablet (10 mg total) by mouth every 6 (six) hours as needed (Nausea or vomiting).  30 tablet  1  . triamterene-hydrochlorothiazide (MAXZIDE-25) 37.5-25 MG per  tablet Take 1 tablet by mouth daily.      Marland Kitchen zolpidem (AMBIEN) 5 MG tablet Take 1 tablet (5 mg total) by mouth at bedtime as needed for sleep.  30 tablet  3   No current facility-administered medications for this visit.    OBJECTIVE: Filed Vitals:   05/11/13 1019  BP: 143/95  Pulse: 115  Temp: 98.2 F (36.8 C)  Resp: 19     Body mass index is 21.23 kg/(m^2).    ECOG FS:0  HEENT: Sclerae anicteric.  Conjunctivae were pink. Pupils round and reactive bilaterally. Oral mucosa is moist without ulceration or thrush. No occipital, submandibular, cervical, supraclavicular or axillar adenopathy. Lungs: clear to auscultation without wheezes. No rales or rhonchi. Heart: regular rate and rhythm. No murmur, gallop or rubs. Abdomen: soft, non tender. No guarding or rebound tenderness. Bowel sounds are present. No palpable hepatosplenomegaly. MSK: no focal spinal tenderness. Extremities: No clubbing or cyanosis.No calf tenderness to palpitation, no peripheral edema. The patient had grossly intact strength in upper and lower extremities Neuro: non-focal, alert and oriented to time, person and place, appropriate affect  LAB RESULTS:  CMP     Component Value Date/Time   NA 138 05/11/2013 1002   NA 140 03/04/2013 0950   K 4.3 05/11/2013 1002   K 3.6 03/04/2013 0950   CL 106 03/23/2013 0920   CL 104 03/04/2013 0950   CO2 25 05/11/2013 1002   CO2 27 03/04/2013 0950   GLUCOSE 90 05/11/2013 1002   GLUCOSE 94 03/23/2013 0920   GLUCOSE 100* 03/04/2013 0950   BUN 13.8 05/11/2013 1002   BUN 13 03/04/2013 0950   CREATININE 0.7 05/11/2013 1002   CREATININE 0.56 03/04/2013 0950   CALCIUM 9.8 05/11/2013 1002   CALCIUM 9.1 03/04/2013 0950   PROT 7.2 05/11/2013 1002   ALBUMIN 4.0 05/11/2013 1002   AST 13 05/11/2013 1002   ALT 8 05/11/2013 1002   ALKPHOS 85 05/11/2013 1002   BILITOT <0.20 Repeated and Verified 05/11/2013 1002   GFRNONAA >90 03/04/2013 0950   GFRAA >90 03/04/2013 0950    I No results found for this  basename: SPEP, UPEP,  kappa and lambda light chains    Lab Results  Component Value Date   WBC 5.9 05/11/2013   NEUTROABS 3.7 05/11/2013   HGB 12.7 05/11/2013   HCT 37.9 05/11/2013   MCV 97.7 05/11/2013   PLT 258 05/11/2013      Chemistry      Component Value Date/Time   NA 138 05/11/2013 1002   NA 140 03/04/2013 0950   K 4.3 05/11/2013 1002   K 3.6 03/04/2013 0950   CL 106 03/23/2013 0920   CL 104 03/04/2013 0950   CO2 25 05/11/2013 1002   CO2 27 03/04/2013 0950   BUN 13.8 05/11/2013 1002   BUN 13 03/04/2013 0950   CREATININE 0.7 05/11/2013 1002   CREATININE 0.56 03/04/2013 0950      Component Value Date/Time   CALCIUM 9.8 05/11/2013  1002   CALCIUM 9.1 03/04/2013 0950   ALKPHOS 85 05/11/2013 1002   AST 13 05/11/2013 1002   ALT 8 05/11/2013 1002   BILITOT <0.20 Repeated and Verified 05/11/2013 1002       Lab Results  Component Value Date   LABCA2 565* 01/27/2013    STUDIES: Ct Chest W Contrast  05/09/2013   *RADIOLOGY REPORT*  Clinical Data:  Advanced ovarian cancer.  Evaluate response to chemotherapy.  CT CHEST, ABDOMEN AND PELVIS WITH CONTRAST  Technique:  Multidetector CT imaging of the chest, abdomen and pelvis was performed following the standard protocol during bolus administration of intravenous contrast.  Contrast: OMNIPAQUE IOHEXOL 300 MG/ML  SOLN  Comparison:  PET CT scan 02/03/2013 the  CT CHEST  Findings:  There is a port in the right anterior chest wall.  No axillary lymphadenopathy.  Left supraclavicular lymph nodes is no longer measurable.  No mediastinal or hilar lymphadenopathy.  No pericardial fluid.  Review lung parenchyma demonstrates no suspicious pulmonary nodules.  IMPRESSION:  1.  No evidence of disease progression in the thorax. 2.  Interval decrease in size of mediastinal and supraclavicular lymphadenopathy.  CT ABDOMEN AND PELVIS  Findings:  The bulky peritoneal implants have been reduced dramatically in size.  The largest implant was in the right upper abdomen  at measuring 5.3 x 3.7 cm on prior exam and is now no longer detectable.  Likewise smaller implants in the left upper quadrant adjacent to the splenic flexure is no longer discretely measurable.  There is a focus of mesenteric stranding at this site (image 73).  The subcapsular lesion along the right hepatic lobe is now only barely detectable as a 2 mm depression (image 69) decreased from the 13 mm on prior.  Likewise the bulky periaortic retroperitoneal adenopathy is decreased significantly with small shotty lymph nodes left of the aorta measuring 5 mm labs.  Large multiloculated cystic and solid mass within the pelvis is no longer present.   There is tissue in the right adnexa measuring 23 x 15 mm (image 108) which likely represents ovarian tissue.  The left ovary is not enlarged.   Uterus appears normal. Bladder is normal.  No enhancing hepatic lesion.  There are gallstones in the gallbladder.  The pancreas, spleen, adrenal glands, kidneys are normal.  No aggressive osseous lesions.  IMPRESSION: 1.  Near complete resolution of the bulky the peritoneal implants. 2.  Marked reduction in size of periaortic adenopathy with lymph nodes now less than 5 mm. 3.  Marked reduction in the volume of the cystic and solid pelvic mass with now only a normal  volume of right ovarian tissue measurable.   Original Report Authenticated By: Genevive Bi, M.D.   Ct Abdomen Pelvis W Contrast  05/09/2013   *RADIOLOGY REPORT*  Clinical Data:  Advanced ovarian cancer.  Evaluate response to chemotherapy.  CT CHEST, ABDOMEN AND PELVIS WITH CONTRAST  Technique:  Multidetector CT imaging of the chest, abdomen and pelvis was performed following the standard protocol during bolus administration of intravenous contrast.  Contrast: OMNIPAQUE IOHEXOL 300 MG/ML  SOLN  Comparison:  PET CT scan 02/03/2013 the  CT CHEST  Findings:  There is a port in the right anterior chest wall.  No axillary lymphadenopathy.  Left supraclavicular lymph  nodes is no longer measurable.  No mediastinal or hilar lymphadenopathy.  No pericardial fluid.  Review lung parenchyma demonstrates no suspicious pulmonary nodules.  IMPRESSION:  1.  No evidence of disease progression in  the thorax. 2.  Interval decrease in size of mediastinal and supraclavicular lymphadenopathy.  CT ABDOMEN AND PELVIS  Findings:  The bulky peritoneal implants have been reduced dramatically in size.  The largest implant was in the right upper abdomen at measuring 5.3 x 3.7 cm on prior exam and is now no longer detectable.  Likewise smaller implants in the left upper quadrant adjacent to the splenic flexure is no longer discretely measurable.  There is a focus of mesenteric stranding at this site (image 73).  The subcapsular lesion along the right hepatic lobe is now only barely detectable as a 2 mm depression (image 69) decreased from the 13 mm on prior.  Likewise the bulky periaortic retroperitoneal adenopathy is decreased significantly with small shotty lymph nodes left of the aorta measuring 5 mm labs.  Large multiloculated cystic and solid mass within the pelvis is no longer present.   There is tissue in the right adnexa measuring 23 x 15 mm (image 108) which likely represents ovarian tissue.  The left ovary is not enlarged.   Uterus appears normal. Bladder is normal.  No enhancing hepatic lesion.  There are gallstones in the gallbladder.  The pancreas, spleen, adrenal glands, kidneys are normal.  No aggressive osseous lesions.  IMPRESSION: 1.  Near complete resolution of the bulky the peritoneal implants. 2.  Marked reduction in size of periaortic adenopathy with lymph nodes now less than 5 mm. 3.  Marked reduction in the volume of the cystic and solid pelvic mass with now only a normal  volume of right ovarian tissue measurable.   Original Report Authenticated By: Genevive Bi, M.D.   ASSESSMENT AND PLAN:  1. Issue: Poorly differentiated ovarian carcinoma with metastatic disease to  lymph nodes.  - CT scan from 05/09/2013 show good response to treatment. This was discussed with the patient. - The patient is doing well on chemo. She has no dose limiting toxicity. D. Ha advised her to continue with chemo without modification. She is due to see GynOnc next week on 08/072014.  2. Anxiety/Depression: Mood improved with counseling. She does not want to be on anti depressant.  3. Follow up: Weekly lab and chemo with Taxol (carboplatin is only q3wk). Return visit in about 3 weeks.  The length of time of the face-to-face encounter was 25 minutes. More than 50% of time was spent counseling and coordination of care.    Myra Rude, MD   05/15/2013 11:45 PM

## 2013-05-18 ENCOUNTER — Ambulatory Visit (HOSPITAL_BASED_OUTPATIENT_CLINIC_OR_DEPARTMENT_OTHER): Payer: Commercial Managed Care - PPO

## 2013-05-18 ENCOUNTER — Other Ambulatory Visit (HOSPITAL_BASED_OUTPATIENT_CLINIC_OR_DEPARTMENT_OTHER): Payer: Commercial Managed Care - PPO | Admitting: Lab

## 2013-05-18 VITALS — BP 120/80 | HR 89 | Temp 98.0°F | Resp 18

## 2013-05-18 DIAGNOSIS — C801 Malignant (primary) neoplasm, unspecified: Secondary | ICD-10-CM

## 2013-05-18 DIAGNOSIS — C569 Malignant neoplasm of unspecified ovary: Secondary | ICD-10-CM

## 2013-05-18 DIAGNOSIS — C561 Malignant neoplasm of right ovary: Secondary | ICD-10-CM

## 2013-05-18 DIAGNOSIS — C779 Secondary and unspecified malignant neoplasm of lymph node, unspecified: Secondary | ICD-10-CM

## 2013-05-18 DIAGNOSIS — Z5111 Encounter for antineoplastic chemotherapy: Secondary | ICD-10-CM

## 2013-05-18 LAB — COMPREHENSIVE METABOLIC PANEL (CC13)
ALT: 11 U/L (ref 0–55)
AST: 14 U/L (ref 5–34)
Albumin: 3.7 g/dL (ref 3.5–5.0)
Alkaline Phosphatase: 79 U/L (ref 40–150)
Calcium: 9.6 mg/dL (ref 8.4–10.4)
Chloride: 100 mEq/L (ref 98–109)
Potassium: 4.1 mEq/L (ref 3.5–5.1)

## 2013-05-18 LAB — CBC WITH DIFFERENTIAL/PLATELET
BASO%: 1.1 % (ref 0.0–2.0)
EOS%: 1.3 % (ref 0.0–7.0)
MCH: 33.7 pg (ref 25.1–34.0)
MCHC: 34.7 g/dL (ref 31.5–36.0)
RBC: 3.54 10*6/uL — ABNORMAL LOW (ref 3.70–5.45)
RDW: 14.4 % (ref 11.2–14.5)
lymph#: 1.5 10*3/uL (ref 0.9–3.3)

## 2013-05-18 MED ORDER — SODIUM CHLORIDE 0.9 % IV SOLN
Freq: Once | INTRAVENOUS | Status: AC
  Administered 2013-05-18: 11:00:00 via INTRAVENOUS

## 2013-05-18 MED ORDER — PACLITAXEL CHEMO INJECTION 300 MG/50ML
80.0000 mg/m2 | Freq: Once | INTRAVENOUS | Status: AC
  Administered 2013-05-18: 132 mg via INTRAVENOUS
  Filled 2013-05-18: qty 22

## 2013-05-18 MED ORDER — DIPHENHYDRAMINE HCL 50 MG/ML IJ SOLN
50.0000 mg | Freq: Once | INTRAMUSCULAR | Status: AC
  Administered 2013-05-18: 50 mg via INTRAVENOUS

## 2013-05-18 MED ORDER — HEPARIN SOD (PORK) LOCK FLUSH 100 UNIT/ML IV SOLN
500.0000 [IU] | Freq: Once | INTRAVENOUS | Status: AC | PRN
  Administered 2013-05-18: 500 [IU]
  Filled 2013-05-18: qty 5

## 2013-05-18 MED ORDER — SODIUM CHLORIDE 0.9 % IJ SOLN
10.0000 mL | INTRAMUSCULAR | Status: DC | PRN
  Administered 2013-05-18: 10 mL
  Filled 2013-05-18: qty 10

## 2013-05-18 MED ORDER — FAMOTIDINE IN NACL 20-0.9 MG/50ML-% IV SOLN
20.0000 mg | Freq: Once | INTRAVENOUS | Status: AC
  Administered 2013-05-18: 20 mg via INTRAVENOUS

## 2013-05-18 MED ORDER — ONDANSETRON 8 MG/50ML IVPB (CHCC)
8.0000 mg | Freq: Once | INTRAVENOUS | Status: AC
  Administered 2013-05-18: 8 mg via INTRAVENOUS

## 2013-05-18 MED ORDER — DEXAMETHASONE SODIUM PHOSPHATE 20 MG/5ML IJ SOLN
20.0000 mg | Freq: Once | INTRAMUSCULAR | Status: AC
  Administered 2013-05-18: 20 mg via INTRAVENOUS

## 2013-05-18 NOTE — Patient Instructions (Addendum)
Paclitaxel injection What is this medicine? PACLITAXEL (PAK li TAX el) is a chemotherapy drug. It targets fast dividing cells, like cancer cells, and causes these cells to die. This medicine is used to treat ovarian cancer, breast cancer, and other cancers. This medicine Jarmon be used for other purposes; ask your health care provider or pharmacist if you have questions. What should I tell my health care provider before I take this medicine? They need to know if you have any of these conditions: -blood disorders -irregular heartbeat -infection (especially a virus infection such as chickenpox, cold sores, or herpes) -liver disease -previous or ongoing radiation therapy -an unusual or allergic reaction to paclitaxel, alcohol, polyoxyethylated castor oil, other chemotherapy agents, other medicines, foods, dyes, or preservatives -pregnant or trying to get pregnant -breast-feeding How should I use this medicine? This drug is given as an infusion into a vein. It is administered in a hospital or clinic by a specially trained health care professional. Talk to your pediatrician regarding the use of this medicine in children. Special care Yohe be needed. Overdosage: If you think you have taken too much of this medicine contact a poison control center or emergency room at once. NOTE: This medicine is only for you. Do not share this medicine with others. What if I miss a dose? It is important not to miss your dose. Call your doctor or health care professional if you are unable to keep an appointment. What Zou interact with this medicine? Do not take this medicine with any of the following medications: -disulfiram -metronidazole This medicine Broda also interact with the following medications: -cyclosporine -dexamethasone -diazepam -ketoconazole -medicines to increase blood counts like filgrastim, pegfilgrastim, sargramostim -other chemotherapy drugs like cisplatin, doxorubicin, epirubicin, etoposide,  teniposide, vincristine -quinidine -testosterone -vaccines -verapamil Talk to your doctor or health care professional before taking any of these medicines: -acetaminophen -aspirin -ibuprofen -ketoprofen -naproxen This list Houpt not describe all possible interactions. Give your health care provider a list of all the medicines, herbs, non-prescription drugs, or dietary supplements you use. Also tell them if you smoke, drink alcohol, or use illegal drugs. Some items [place] interact with your medicine. What should I watch for while using this medicine? Your condition will be monitored carefully while you are receiving this medicine. You will need important blood work done while you are taking this medicine. This drug Russman make you feel generally unwell. This is not uncommon, as chemotherapy can affect healthy cells as well as cancer cells. Report any side effects. Continue your course of treatment even though you feel ill unless your doctor tells you to stop. In some cases, you Hilleary be given additional medicines to help with side effects. Follow all directions for their use. Call your doctor or health care professional for advice if you get a fever, chills or sore throat, or other symptoms of a cold or flu. Do not treat yourself. This drug decreases your body's ability to fight infections. Try to avoid being around people who are sick. This medicine Havlin increase your risk to bruise or bleed. Call your doctor or health care professional if you notice any unusual bleeding. Be careful brushing and flossing your teeth or using a toothpick because you Soderberg get an infection or bleed more easily. If you have any dental work done, tell your dentist you are receiving this medicine. Avoid taking products that contain aspirin, acetaminophen, ibuprofen, naproxen, or ketoprofen unless instructed by your doctor. These medicines [place] hide a fever. Do not become pregnant  while taking this medicine. Women should inform their  doctor if they wish to become pregnant or think they might be pregnant. There is a potential for serious side effects to an unborn child. Talk to your health care professional or pharmacist for more information. Do not breast-feed an infant while taking this medicine. Men are advised not to father a child while receiving this medicine. What side effects Ferns I notice from receiving this medicine? Side effects that you should report to your doctor or health care professional as soon as possible: -allergic reactions like skin rash, itching or hives, swelling of the face, lips, or tongue -low blood counts - This drug Deharo decrease the number of white blood cells, red blood cells and platelets. You Kemp be at increased risk for infections and bleeding. -signs of infection - fever or chills, cough, sore throat, pain or difficulty passing urine -signs of decreased platelets or bleeding - bruising, pinpoint red spots on the skin, black, tarry stools, nosebleeds -signs of decreased red blood cells - unusually weak or tired, fainting spells, lightheadedness -breathing problems -chest pain -high or low blood pressure -mouth sores -nausea and vomiting -pain, swelling, redness or irritation at the injection site -pain, tingling, numbness in the hands or feet -slow or irregular heartbeat -swelling of the ankle, feet, hands Side effects that usually do not require medical attention (report to your doctor or health care professional if they continue or are bothersome): -bone pain -complete hair loss including hair on your head, underarms, pubic hair, eyebrows, and eyelashes -changes in the color of fingernails -diarrhea -loosening of the fingernails -loss of appetite -muscle or joint pain -red flush to skin -sweating This list Beckley not describe all possible side effects. Call your doctor for medical advice about side effects. You Markwood report side effects to FDA at 1-800-FDA-1088. Where should I keep my  medicine? This drug is given in a hospital or clinic and will not be stored at home. NOTE: This sheet is a summary. It Hagenow not cover all possible information. If you have questions about this medicine, talk to your doctor, pharmacist, or health care provider.  2013, [employer]. (09/11/2008 11:54:26 AM)

## 2013-05-19 ENCOUNTER — Encounter: Payer: Self-pay | Admitting: Gynecologic Oncology

## 2013-05-19 ENCOUNTER — Other Ambulatory Visit: Payer: Self-pay | Admitting: *Deleted

## 2013-05-19 ENCOUNTER — Ambulatory Visit: Payer: Commercial Managed Care - PPO | Attending: Gynecologic Oncology | Admitting: Gynecologic Oncology

## 2013-05-19 VITALS — BP 128/72 | HR 100 | Temp 99.2°F | Resp 16 | Ht 66.0 in | Wt 134.4 lb

## 2013-05-19 DIAGNOSIS — C569 Malignant neoplasm of unspecified ovary: Secondary | ICD-10-CM | POA: Insufficient documentation

## 2013-05-19 DIAGNOSIS — R232 Flushing: Secondary | ICD-10-CM

## 2013-05-19 DIAGNOSIS — I10 Essential (primary) hypertension: Secondary | ICD-10-CM | POA: Insufficient documentation

## 2013-05-19 DIAGNOSIS — C778 Secondary and unspecified malignant neoplasm of lymph nodes of multiple regions: Secondary | ICD-10-CM | POA: Insufficient documentation

## 2013-05-19 DIAGNOSIS — R599 Enlarged lymph nodes, unspecified: Secondary | ICD-10-CM | POA: Insufficient documentation

## 2013-05-19 MED ORDER — CLONIDINE HCL 0.1 MG PO TABS: 0.1000 mg | ORAL_TABLET | Freq: Every day | ORAL | Status: DC

## 2013-05-19 NOTE — Progress Notes (Signed)
Office visit  Note: Gyn-Onc  Consult was requested by Dr. Gaylyn Rong for the evaluation of Deborah Macdonald 56 y.o. female  CC: Stage IV  Metastatic gyn cancer   Assessment/Plan:  Deborah Macdonald  is a. 57 y.o. year old with stage IV B. ovarian carcinoma. The initial presentation was that of extra-abdominal lymphadenopathy, metastatic disease within the supraclavicular lymph nodes and  CA 125 elevated to 1202.4.  It is my recommendation that she receive 3-4 cycles of  dose dense neoadjuvant Taxol platinum therapy followed by repeat imaging after cycle 3 with consideration for interval debulking if the extra-abdominal disease resolves.  The patient has completed 5 cycles of carbo/taxol.  CA 125  Is 39.9.  I counseled the patient that it Manny be more prudent to perform exploratory laparotomy total abdominal cystectomy bilateral salpingo-oophorectomy omentectomy after completion of cycle 6. It is the plan that this procedure will occur on October 14. The risks of the  procedure discussed with the patient were inclusive of infection bleeding damage to surrounding structures prolonged hospitalization and reoperation. The patient and her husband's questions were answered to her satisfaction.  She will followup on September 18 for preoperative visit.  The patient is aware that if residual disease is identified at the time of surgery additional cycles of chemotherapy will be recommended.   HPI: 57 y.o. gravida 3 para 3 who noted a left cervical adenopathy in September.  She noted that it was a slowly increasing in size and the initial workup was for evaluation for lung disease.  She underwent excision of supraclavicular LN 01/21/2013 The malignant cells are positive for cytokeratin 7, estrogen receptor, progesterone receptor, and focally for p53. They are negative for cytokeratin 20, GCDFP, Napsin-A, , thyroglobulin, and TTF-1. This immunohistochemical profile raises the possibility of metastatic gynecologic carcinoma or  metastatic breast carcinoma. The former is favored.  01/27/13 PET IMPRESSION:  1. 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 2. Bulky peritoneal metastasis in the upper abdomen and along the capsule of the liver. 3. Hypermetabolic retroperitoneal nodal metastasis. 4. Small hypermetabolic mediastinal nodal metastasis. 5. Supraclavicular hypermetabolic nodal metastasis.  Lab Results  Component Value Date   CA125 39.9* 05/11/2013   CT Chest/abd/pelvis 05/09/2013  IMPRESSION:  1. Near complete resolution of the bulky the peritoneal implants. 2. Marked reduction in size of periaortic adenopathy with lymph nodes now less than 5 mm.  3. Marked reduction in the volume of the cystic and solid pelvic mass with now only a normal volume of right ovarian tissue measurable.    1. No evidence of disease progression in the thorax.  2. Interval decrease in size of mediastinal and supraclavicular  lymphadenopathy  Deborah Macdonald  has completed 5 cycles of carbo/taxol.  CA 125  Is 39.9.   Mammogram 01/27/13 Birads 2   Past Surgical Hx:  Past Surgical History  Procedure Laterality Date  . Wisdom tooth extraction    . Lymph node biopsy Left 01/21/2013    Procedure: EXCISIONAL BIOPSY OF THE NECK;  Surgeon: Serena Colonel, MD;  Location: [facility] OR;  Service: ENT;  Laterality: Left;  . Leep      20 year ago    Past Medical Hx:  Past Medical History  Diagnosis Date  . PONV (postoperative nausea and vomiting)   . MVP (mitral valve prolapse)   . Hypertension   . Anxiety   . Shortness of breath  at times "anxiety"  . Ovarian cancer on right   . Regional lymph node metastasis present     Past Gynecological History:  G3 P3 LMP 15 years ago.  Menarche 12.  OCP use x 20 years  H/o LEEP and cervical procedures for cervical dysplasia. Pap 07/2012 wnl  Family History  Problem Relation Age of Onset  .  Heart attack Father   . Melanoma Maternal Uncle     dx in his 73s; mets to brain  . Dementia Maternal Grandmother   . COPD Maternal Grandfather   . Dementia Paternal Grandmother   . COPD Paternal Grandfather   . Testicular cancer Son 31    Review of Systems:  Constitutional  Feels well Cardiovascular  No chest pain, shortness of breath, or edema  Pulmonary  No cough or wheeze.  Gastro Intestinal  No nausea, vomitting, or diarrhoea. No bright red blood per rectum, no abdominal pain, change in bowel movement, or constipation. Denies early satiety or abdominal bloating Genito Urinary  No frequency, urgency, dysuria, no vaginal bleeding no incontinence Musculo Skeletal  No myalgia, arthralgia, joint swelling or pain  Neurologic  No weakness, numbness, change in gait,  Psychology  No depression, anxiety, insomnia.   Vitals: BP 128/72  Pulse 100  Temp(Src) 99.2 F (37.3 C) (Oral)  Resp 16  Ht 5\' 6"  (1.676 m)  Wt 134 lb 6.4 oz (60.963 kg)  BMI 21.7 kg/m2  Physical Exam: WD in NAD Neck  Supple NROM, without any enlargements.  Lymph Node Survey No supraclavicular or inguinal adenopathy incision on left neck. Cardiovascular  Pulse normal rate, regularity and rhythm. S1 and S2 normal.  Lungs  Clear to auscultation bilateraly, without wheezes/crackles/rhonchi. Good air movement.  Psychiatry  Alert and oriented to person, place, and time  Abdomen  Normoactive bowel sounds, abdomen soft, non-tender and obese. No ascites, no fluid wave  Back No CVA tenderness Genito Urinary  Vulva/vagina: Normal external female genitalia.  No lesions. No discharge or bleeding.  Bladder/urethra:  No lesions or masses  Vagina: atrophic  Cervix: flush with the vaginal vault and deviated to the right. Uterus: Small, mobile, no parametrial involvement or nodularity.  Adnexa: No palpable masses. Rectal  Good tone, no masses no cul de sac nodularity.  Extremities  No bilateral cyanosis,  clubbing or edema.   Laurette Schimke, MD, PhD 05/19/2013, 1:47 PM

## 2013-05-19 NOTE — Patient Instructions (Signed)
Followup for preoperative appointment on September 18. Planed hysterectomy and ovarian cancer debulking on 07/26/2013

## 2013-05-24 ENCOUNTER — Other Ambulatory Visit: Payer: Self-pay | Admitting: *Deleted

## 2013-05-24 ENCOUNTER — Telehealth: Payer: Self-pay | Admitting: *Deleted

## 2013-05-24 DIAGNOSIS — C561 Malignant neoplasm of right ovary: Secondary | ICD-10-CM

## 2013-05-25 ENCOUNTER — Ambulatory Visit (HOSPITAL_BASED_OUTPATIENT_CLINIC_OR_DEPARTMENT_OTHER): Payer: Commercial Managed Care - PPO

## 2013-05-25 ENCOUNTER — Encounter: Payer: Self-pay | Admitting: Oncology

## 2013-05-25 ENCOUNTER — Other Ambulatory Visit (HOSPITAL_BASED_OUTPATIENT_CLINIC_OR_DEPARTMENT_OTHER): Payer: Commercial Managed Care - PPO | Admitting: Lab

## 2013-05-25 VITALS — BP 111/66 | HR 73 | Temp 98.8°F

## 2013-05-25 DIAGNOSIS — C779 Secondary and unspecified malignant neoplasm of lymph node, unspecified: Secondary | ICD-10-CM

## 2013-05-25 DIAGNOSIS — C569 Malignant neoplasm of unspecified ovary: Secondary | ICD-10-CM

## 2013-05-25 DIAGNOSIS — Z5111 Encounter for antineoplastic chemotherapy: Secondary | ICD-10-CM

## 2013-05-25 DIAGNOSIS — C561 Malignant neoplasm of right ovary: Secondary | ICD-10-CM

## 2013-05-25 LAB — CBC WITH DIFFERENTIAL/PLATELET
BASO%: 0.4 % (ref 0.0–2.0)
EOS%: 0.8 % (ref 0.0–7.0)
HCT: 32.1 % — ABNORMAL LOW (ref 34.8–46.6)
MCH: 32.6 pg (ref 25.1–34.0)
MCHC: 33 g/dL (ref 31.5–36.0)
MCV: 98.8 fL (ref 79.5–101.0)
MONO%: 9.3 % (ref 0.0–14.0)
NEUT%: 53.7 % (ref 38.4–76.8)
lymph#: 1.7 10*3/uL (ref 0.9–3.3)

## 2013-05-25 LAB — COMPREHENSIVE METABOLIC PANEL (CC13)
AST: 15 U/L (ref 5–34)
Albumin: 3.4 g/dL — ABNORMAL LOW (ref 3.5–5.0)
Alkaline Phosphatase: 73 U/L (ref 40–150)
BUN: 13.9 mg/dL (ref 7.0–26.0)
Creatinine: 0.6 mg/dL (ref 0.6–1.1)
Glucose: 93 mg/dl (ref 70–140)
Potassium: 4.2 mEq/L (ref 3.5–5.1)
Total Bilirubin: <0.2 mg/dL (ref 0.20–1.20)

## 2013-05-25 MED ORDER — DEXAMETHASONE SODIUM PHOSPHATE 20 MG/5ML IJ SOLN
20.0000 mg | Freq: Once | INTRAMUSCULAR | Status: AC
  Administered 2013-05-25: 20 mg via INTRAVENOUS

## 2013-05-25 MED ORDER — PACLITAXEL CHEMO INJECTION 300 MG/50ML
80.0000 mg/m2 | Freq: Once | INTRAVENOUS | Status: AC
  Administered 2013-05-25: 132 mg via INTRAVENOUS
  Filled 2013-05-25: qty 22

## 2013-05-25 MED ORDER — DIPHENHYDRAMINE HCL 50 MG/ML IJ SOLN
50.0000 mg | Freq: Once | INTRAMUSCULAR | Status: AC
  Administered 2013-05-25: 50 mg via INTRAVENOUS

## 2013-05-25 MED ORDER — SODIUM CHLORIDE 0.9 % IV SOLN
Freq: Once | INTRAVENOUS | Status: AC
  Administered 2013-05-25: 11:00:00 via INTRAVENOUS

## 2013-05-25 MED ORDER — SODIUM CHLORIDE 0.9 % IJ SOLN
10.0000 mL | INTRAMUSCULAR | Status: DC | PRN
  Administered 2013-05-25: 10 mL
  Filled 2013-05-25: qty 10

## 2013-05-25 MED ORDER — FAMOTIDINE IN NACL 20-0.9 MG/50ML-% IV SOLN
20.0000 mg | Freq: Once | INTRAVENOUS | Status: AC
  Administered 2013-05-25: 20 mg via INTRAVENOUS

## 2013-05-25 MED ORDER — ONDANSETRON 8 MG/50ML IVPB (CHCC)
8.0000 mg | Freq: Once | INTRAVENOUS | Status: AC
  Administered 2013-05-25: 8 mg via INTRAVENOUS

## 2013-05-25 MED ORDER — HEPARIN SOD (PORK) LOCK FLUSH 100 UNIT/ML IV SOLN
500.0000 [IU] | Freq: Once | INTRAVENOUS | Status: AC | PRN
  Administered 2013-05-25: 500 [IU]
  Filled 2013-05-25: qty 5

## 2013-05-25 NOTE — Patient Instructions (Addendum)
[facility] Discharge Instructions for Patients Receiving Chemotherapy  Today you received the following chemotherapy agents:  Taxol  To help prevent nausea and vomiting after your treatment, we encourage you to take your nausea medication as ordered per MD.   If you develop nausea and vomiting that is not controlled by your nausea medication, call the clinic.   BELOW ARE SYMPTOMS THAT SHOULD BE REPORTED IMMEDIATELY:  *FEVER GREATER THAN 100.5 F  *CHILLS WITH OR WITHOUT FEVER  NAUSEA AND VOMITING THAT IS NOT CONTROLLED WITH YOUR NAUSEA MEDICATION  *UNUSUAL SHORTNESS OF BREATH  *UNUSUAL BRUISING OR BLEEDING  TENDERNESS IN MOUTH AND THROAT WITH OR WITHOUT PRESENCE OF ULCERS  *URINARY PROBLEMS  *BOWEL PROBLEMS  UNUSUAL RASH Items with * indicate a potential emergency and should be followed up as soon as possible.  Feel free to call the clinic you have any questions or concerns. The clinic phone number is [phone].    

## 2013-05-25 NOTE — Telephone Encounter (Signed)
Informed pt letter completed by Clenton Pare and faxed to [employer] at fax #[phone], as requested.

## 2013-05-25 NOTE — Telephone Encounter (Signed)
Pt called requests letter for her disability insurance stating she is still currently undergoing treatment and continues to remain out of work.

## 2013-05-31 ENCOUNTER — Other Ambulatory Visit: Payer: Self-pay | Admitting: *Deleted

## 2013-05-31 DIAGNOSIS — C561 Malignant neoplasm of right ovary: Secondary | ICD-10-CM

## 2013-06-01 ENCOUNTER — Ambulatory Visit: Payer: Commercial Managed Care - PPO | Admitting: Nutrition

## 2013-06-01 ENCOUNTER — Other Ambulatory Visit: Payer: Commercial Managed Care - PPO | Admitting: Lab

## 2013-06-01 ENCOUNTER — Ambulatory Visit (HOSPITAL_BASED_OUTPATIENT_CLINIC_OR_DEPARTMENT_OTHER): Payer: Commercial Managed Care - PPO | Admitting: Hematology and Oncology

## 2013-06-01 ENCOUNTER — Ambulatory Visit (HOSPITAL_BASED_OUTPATIENT_CLINIC_OR_DEPARTMENT_OTHER): Payer: Commercial Managed Care - PPO

## 2013-06-01 ENCOUNTER — Other Ambulatory Visit (HOSPITAL_BASED_OUTPATIENT_CLINIC_OR_DEPARTMENT_OTHER): Payer: Commercial Managed Care - PPO | Admitting: Lab

## 2013-06-01 VITALS — BP 138/78 | HR 78 | Temp 98.5°F | Resp 18 | Ht 66.0 in | Wt 136.2 lb

## 2013-06-01 DIAGNOSIS — C569 Malignant neoplasm of unspecified ovary: Secondary | ICD-10-CM

## 2013-06-01 DIAGNOSIS — Z5111 Encounter for antineoplastic chemotherapy: Secondary | ICD-10-CM

## 2013-06-01 DIAGNOSIS — C561 Malignant neoplasm of right ovary: Secondary | ICD-10-CM

## 2013-06-01 DIAGNOSIS — C779 Secondary and unspecified malignant neoplasm of lymph node, unspecified: Secondary | ICD-10-CM

## 2013-06-01 LAB — COMPREHENSIVE METABOLIC PANEL (CC13)
AST: 13 U/L (ref 5–34)
Albumin: 3.5 g/dL (ref 3.5–5.0)
Alkaline Phosphatase: 71 U/L (ref 40–150)
BUN: 14.2 mg/dL (ref 7.0–26.0)
Creatinine: 0.6 mg/dL (ref 0.6–1.1)
Glucose: 92 mg/dl (ref 70–140)
Potassium: 4.2 mEq/L (ref 3.5–5.1)

## 2013-06-01 LAB — CBC WITH DIFFERENTIAL/PLATELET
Basophils Absolute: 0 10*3/uL (ref 0.0–0.1)
EOS%: 0.9 % (ref 0.0–7.0)
Eosinophils Absolute: 0 10*3/uL (ref 0.0–0.5)
HCT: 32.4 % — ABNORMAL LOW (ref 34.8–46.6)
HGB: 11.2 g/dL — ABNORMAL LOW (ref 11.6–15.9)
LYMPH%: 30.1 % (ref 14.0–49.7)
MCH: 34.4 pg — ABNORMAL HIGH (ref 25.1–34.0)
MCV: 99.3 fL (ref 79.5–101.0)
MONO%: 9.3 % (ref 0.0–14.0)
NEUT#: 2.8 10*3/uL (ref 1.5–6.5)
NEUT%: 58.7 % (ref 38.4–76.8)
Platelets: 178 10*3/uL (ref 145–400)

## 2013-06-01 MED ORDER — SODIUM CHLORIDE 0.9 % IJ SOLN
10.0000 mL | INTRAMUSCULAR | Status: DC | PRN
  Administered 2013-06-01: 10 mL
  Filled 2013-06-01: qty 10

## 2013-06-01 MED ORDER — SODIUM CHLORIDE 0.9 % IV SOLN
651.6000 mg | Freq: Once | INTRAVENOUS | Status: AC
  Administered 2013-06-01: 650 mg via INTRAVENOUS
  Filled 2013-06-01: qty 65

## 2013-06-01 MED ORDER — FAMOTIDINE IN NACL 20-0.9 MG/50ML-% IV SOLN
20.0000 mg | Freq: Once | INTRAVENOUS | Status: AC
  Administered 2013-06-01: 20 mg via INTRAVENOUS

## 2013-06-01 MED ORDER — HEPARIN SOD (PORK) LOCK FLUSH 100 UNIT/ML IV SOLN
500.0000 [IU] | Freq: Once | INTRAVENOUS | Status: AC | PRN
  Administered 2013-06-01: 500 [IU]
  Filled 2013-06-01: qty 5

## 2013-06-01 MED ORDER — ONDANSETRON 16 MG/50ML IVPB (CHCC)
16.0000 mg | Freq: Once | INTRAVENOUS | Status: AC
  Administered 2013-06-01: 16 mg via INTRAVENOUS

## 2013-06-01 MED ORDER — SODIUM CHLORIDE 0.9 % IV SOLN
Freq: Once | INTRAVENOUS | Status: AC
  Administered 2013-06-01: 12:00:00 via INTRAVENOUS

## 2013-06-01 MED ORDER — DEXAMETHASONE SODIUM PHOSPHATE 20 MG/5ML IJ SOLN
20.0000 mg | Freq: Once | INTRAMUSCULAR | Status: AC
Start: 2013-06-01 — End: 2013-06-01
  Administered 2013-06-01: 20 mg via INTRAVENOUS

## 2013-06-01 MED ORDER — PACLITAXEL CHEMO INJECTION 300 MG/50ML
80.0000 mg/m2 | Freq: Once | INTRAVENOUS | Status: AC
  Administered 2013-06-01: 132 mg via INTRAVENOUS
  Filled 2013-06-01: qty 22

## 2013-06-01 MED ORDER — DIPHENHYDRAMINE HCL 50 MG/ML IJ SOLN
50.0000 mg | Freq: Once | INTRAMUSCULAR | Status: AC
  Administered 2013-06-01: 50 mg via INTRAVENOUS

## 2013-06-01 NOTE — Patient Instructions (Signed)
[facility] Discharge Instructions for Patients Receiving Chemotherapy  Today you received the following chemotherapy agents : Taxol and Carboplatin  To help prevent nausea and vomiting after your treatment, we encourage you to take your nausea medications :   Compazine 10 mg every 6 hours as needed for nausea  Zofran 8 mg twice daily X 3 days (starting 8/21), then twice daily as needed   If you develop nausea and vomiting that is not controlled by your nausea medication, call the clinic.   BELOW ARE SYMPTOMS THAT SHOULD BE REPORTED IMMEDIATELY:  *FEVER GREATER THAN 100.5 F  *CHILLS WITH OR WITHOUT FEVER  NAUSEA AND VOMITING THAT IS NOT CONTROLLED WITH YOUR NAUSEA MEDICATION  *UNUSUAL SHORTNESS OF BREATH  *UNUSUAL BRUISING OR BLEEDING  TENDERNESS IN MOUTH AND THROAT WITH OR WITHOUT PRESENCE OF ULCERS  *URINARY PROBLEMS  *BOWEL PROBLEMS  UNUSUAL RASH Items with * indicate a potential emergency and should be followed up as soon as possible.  Feel free to call the clinic you have any questions or concerns. The clinic phone number is [phone].  It has been a pleasure serving you today.

## 2013-06-01 NOTE — Progress Notes (Signed)
Patient voices concern regarding weight gain.  Patient's weight was documented as 136.2 pounds August 20, which is increased from 131.5 pounds July 30.  Patient reports this is likely due to steroids.  Patient has no nutrition side effects at this time.  Nutrition diagnosis: Inadequate oral intake improved.  Intervention: I have assured patient that her weight is appropriate.  I've reinforced the importance of patient consuming a healthy, high-protein diet.  Teach back method used.  Monitoring, evaluation, goals: Patient continues to be able to add calories and protein for appropriate weight gain.  Next visit: There is no followup scheduled at this time.  I will continue to work with patient as needed.  She has my contact information for questions or concerns.

## 2013-06-02 ENCOUNTER — Ambulatory Visit: Payer: Commercial Managed Care - PPO

## 2013-06-06 ENCOUNTER — Telehealth: Payer: Self-pay | Admitting: *Deleted

## 2013-06-06 ENCOUNTER — Telehealth: Payer: Self-pay | Admitting: Hematology and Oncology

## 2013-06-06 NOTE — Telephone Encounter (Signed)
MW added tx...call and advised pt on d/t for Aug adn Sept tx.

## 2013-06-06 NOTE — Telephone Encounter (Signed)
Per staff message I have scheduled treatment

## 2013-06-07 ENCOUNTER — Other Ambulatory Visit: Payer: Self-pay | Admitting: *Deleted

## 2013-06-07 NOTE — Progress Notes (Signed)
IDMessiah Rovira Macdonald OB: 12/19/1955  MR#: [id number]  FAO#:[id number]  [facility]  [phone] [phone]   OFFICE PROGRESS NOTE  PCP: Primus Bravo, NP  DIAGNOSIS: Metastatic ovarian cancer.   PAST THERAPY: Biopsy only.   CURRENT THERAPY: started neoadjuvant Carboplatin d1; Taxol d1,8,15 every 3 weeks on 02/09/2013. Patient was  seen by Dr. [name] [facility]) in August 2014 who felt that patient is a resectable candidate. Surgery planned on October 14.  INTERVAL HISTORY:  Deborah Macdonald 56 y.o. female returns for regular follow up. She reported that her appetite is too good and she gain weight. Occasionally she has blurry vision. She handle successfully constipation with stool softener. No other complaints.  The patient denied fever, chills, night sweats. She denied headaches, double vision,  nasal congestion, nasal discharge, hearing problems, odynophagia or dysphagia. No chest pain, palpitations, dyspnea, cough, abdominal pain, nausea, vomiting, diarrhea,  hematochezia. The patient denied dysuria, nocturia, polyuria, hematuria, myalgia, numbness, tingling, psychiatric problems.  Review of Systems  Constitutional: Negative for fever, chills, weight loss, malaise/fatigue and diaphoresis.  HENT: Negative for hearing loss, ear pain, nosebleeds, congestion, sore throat, neck pain and tinnitus.   Eyes: Positive for blurred vision. Negative for double vision, photophobia and pain.  Respiratory: Negative for cough, hemoptysis, sputum production, shortness of breath and wheezing.   Cardiovascular: Negative for chest pain, palpitations, orthopnea, claudication and leg swelling.  Gastrointestinal: Positive for constipation. Negative for heartburn, nausea, vomiting, abdominal pain, diarrhea, blood in stool and melena.  Genitourinary: Negative for dysuria, urgency, frequency, hematuria and flank pain.  Musculoskeletal: Negative for myalgias, back pain, joint pain and falls.   Skin: Negative for itching and rash.  Neurological: Positive for sensory change. Negative for dizziness, tingling, focal weakness, seizures, loss of consciousness, weakness and headaches.  Endo/Heme/Allergies: Does not bruise/bleed easily.  Psychiatric/Behavioral: Negative.      PAST MEDICAL HISTORY: Past Medical History  Diagnosis Date  . PONV (postoperative nausea and vomiting)   . MVP (mitral valve prolapse)   . Hypertension   . Anxiety   . Shortness of breath     at times "anxiety"  . Ovarian cancer on right   . Regional lymph node metastasis present     PAST SURGICAL HISTORY: Past Surgical History  Procedure Laterality Date  . Wisdom tooth extraction    . Lymph node biopsy Left 01/21/2013    Procedure: EXCISIONAL BIOPSY OF THE NECK;  Surgeon: Serena Colonel, MD;  Location: [facility] OR;  Service: ENT;  Laterality: Left;  . Leep      20 year ago    FAMILY HISTORY Family History  Problem Relation Age of Onset  . Heart attack Father   . Melanoma Maternal Uncle     dx in his 58s; mets to brain  . Dementia Maternal Grandmother   . COPD Maternal Grandfather   . Dementia Paternal Grandmother   . COPD Paternal Grandfather   . Testicular cancer Son 47    HEALTH MAINTENANCE: History  Substance Use Topics  . Smoking status: Former Smoker -- 1.00 packs/day for 39 years    Quit date: 01/13/2013  . Smokeless tobacco: Never Used  . Alcohol Use: 1.0 oz/week    2 drink(s) per week    Allergies  Allergen Reactions  . Codeine Nausea Only    Current Outpatient Prescriptions  Medication Sig Dispense Refill  . ALPRAZolam (XANAX) 0.5 MG tablet Take 0.5 mg by mouth 3 (three) times daily.      Marland Kitchen  Aspirin-Salicylamide-Caffeine (BC HEADACHE POWDER PO) Take 1 packet by mouth every 6 (six) hours as needed (headache).      . cloNIDine (CATAPRES) 0.1 MG tablet Take 1 tablet (0.1 mg total) by mouth daily.  60 tablet  11  . escitalopram (LEXAPRO) 5 MG tablet Take 5 mg by mouth daily.      Marland Kitchen  lidocaine-prilocaine (EMLA) cream Apply topically as needed. Apply to skin over Porta Cath site at least one hour prior to needle stick  30 g  3  . loratadine (CLARITIN) 10 MG tablet Take 10 mg by mouth daily.      . ondansetron (ZOFRAN) 8 MG tablet Take 1 tablet (8 mg total) by mouth 2 (two) times daily. Twice daily starting the day after chemo for 3 days then as needed.  30 tablet  3  . prochlorperazine (COMPAZINE) 10 MG tablet Take 1 tablet (10 mg total) by mouth every 6 (six) hours as needed (Nausea or vomiting).  30 tablet  1  . triamterene-hydrochlorothiazide (MAXZIDE-25) 37.5-25 MG per tablet Take 1 tablet by mouth daily.      Marland Kitchen zolpidem (AMBIEN) 5 MG tablet Take 1 tablet (5 mg total) by mouth at bedtime as needed for sleep.  30 tablet  3   No current facility-administered medications for this visit.    OBJECTIVE: Filed Vitals:   06/01/13 0945  BP: 138/78  Pulse: 78  Temp: 98.5 F (36.9 C)  Resp: 18     Body mass index is 21.99 kg/(m^2).    ECOG FS: 0  PHYSICAL EXAMINATION:  HEENT: Sclerae anicteric.  Conjunctivae were pink. Pupils round and reactive bilaterally. Oral mucosa is moist without ulceration or thrush. No occipital, submandibular, cervical, supraclavicular or axillar adenopathy. Lungs: clear to auscultation without wheezes. No rales or rhonchi. Heart: regular rate and rhythm. No murmur, gallop or rubs. Abdomen: soft, non tender. No guarding or rebound tenderness. Bowel sounds are present. No palpable hepatosplenomegaly. MSK: no focal spinal tenderness. Extremities: No clubbing or cyanosis.No calf tenderness to palpitation, no peripheral edema. The patient had grossly intact strength in upper and lower extremities. Skin exam was without ecchymosis, petechiae. Neuro: non-focal, alert and oriented to time, person and place, appropriate affect  LAB RESULTS:  CMP     Component Value Date/Time   NA 140 06/01/2013 0932   NA 140 03/04/2013 0950   K 4.2 06/01/2013 0932   K  3.6 03/04/2013 0950   CL 106 03/23/2013 0920   CL 104 03/04/2013 0950   CO2 25 06/01/2013 0932   CO2 27 03/04/2013 0950   GLUCOSE 92 06/01/2013 0932   GLUCOSE 94 03/23/2013 0920   GLUCOSE 100* 03/04/2013 0950   BUN 14.2 06/01/2013 0932   BUN 13 03/04/2013 0950   CREATININE 0.6 06/01/2013 0932   CREATININE 0.56 03/04/2013 0950   CALCIUM 9.2 06/01/2013 0932   CALCIUM 9.1 03/04/2013 0950   PROT 6.6 06/01/2013 0932   ALBUMIN 3.5 06/01/2013 0932   AST 13 06/01/2013 0932   ALT 16 06/01/2013 0932   ALKPHOS 71 06/01/2013 0932   BILITOT <0.20 06/01/2013 0932   GFRNONAA >90 03/04/2013 0950   GFRAA >90 03/04/2013 0950    Lab Results  Component Value Date   WBC 4.8 06/01/2013   NEUTROABS 2.8 06/01/2013   HGB 11.2* 06/01/2013   HCT 32.4* 06/01/2013   MCV 99.3 06/01/2013   PLT 178 06/01/2013      Chemistry      Component Value Date/Time   NA 140  06/01/2013 0932   NA 140 03/04/2013 0950   K 4.2 06/01/2013 0932   K 3.6 03/04/2013 0950   CL 106 03/23/2013 0920   CL 104 03/04/2013 0950   CO2 25 06/01/2013 0932   CO2 27 03/04/2013 0950   BUN 14.2 06/01/2013 0932   BUN 13 03/04/2013 0950   CREATININE 0.6 06/01/2013 0932   CREATININE 0.56 03/04/2013 0950      Component Value Date/Time   CALCIUM 9.2 06/01/2013 0932   CALCIUM 9.1 03/04/2013 0950   ALKPHOS 71 06/01/2013 0932   AST 13 06/01/2013 0932   ALT 16 06/01/2013 0932   BILITOT <0.20 06/01/2013 0932       Lab Results  Component Value Date   LABCA2 565* 01/27/2013     STUDIES: Ct Chest W Contrast  05/09/2013   *RADIOLOGY REPORT*  Clinical Data:  Advanced ovarian cancer.  Evaluate response to chemotherapy.  CT CHEST, ABDOMEN AND PELVIS WITH CONTRAST  Technique:  Multidetector CT imaging of the chest, abdomen and pelvis was performed following the standard protocol during bolus administration of intravenous contrast.  Contrast: OMNIPAQUE IOHEXOL 300 MG/ML  SOLN  Comparison:  PET CT scan 02/03/2013 the  CT CHEST  Findings:  There is a port in the right  anterior chest wall.  No axillary lymphadenopathy.  Left supraclavicular lymph nodes is no longer measurable.  No mediastinal or hilar lymphadenopathy.  No pericardial fluid.  Review lung parenchyma demonstrates no suspicious pulmonary nodules.  IMPRESSION:  1.  No evidence of disease progression in the thorax. 2.  Interval decrease in size of mediastinal and supraclavicular lymphadenopathy.  CT ABDOMEN AND PELVIS  Findings:  The bulky peritoneal implants have been reduced dramatically in size.  The largest implant was in the right upper abdomen at measuring 5.3 x 3.7 cm on prior exam and is now no longer detectable.  Likewise smaller implants in the left upper quadrant adjacent to the splenic flexure is no longer discretely measurable.  There is a focus of mesenteric stranding at this site (image 73).  The subcapsular lesion along the right hepatic lobe is now only barely detectable as a 2 mm depression (image 69) decreased from the 13 mm on prior.  Likewise the bulky periaortic retroperitoneal adenopathy is decreased significantly with small shotty lymph nodes left of the aorta measuring 5 mm labs.  Large multiloculated cystic and solid mass within the pelvis is no longer present.   There is tissue in the right adnexa measuring 23 x 15 mm (image 108) which likely represents ovarian tissue.  The left ovary is not enlarged.   Uterus appears normal. Bladder is normal.  No enhancing hepatic lesion.  There are gallstones in the gallbladder.  The pancreas, spleen, adrenal glands, kidneys are normal.  No aggressive osseous lesions.  IMPRESSION: 1.  Near complete resolution of the bulky the peritoneal implants. 2.  Marked reduction in size of periaortic adenopathy with lymph nodes now less than 5 mm. 3.  Marked reduction in the volume of the cystic and solid pelvic mass with now only a normal  volume of right ovarian tissue measurable.   Original Report Authenticated By: Genevive Bi, M.D.   Ct Abdomen Pelvis W  Contrast  05/09/2013   *RADIOLOGY REPORT*  Clinical Data:  Advanced ovarian cancer.  Evaluate response to chemotherapy.  CT CHEST, ABDOMEN AND PELVIS WITH CONTRAST  Technique:  Multidetector CT imaging of the chest, abdomen and pelvis was performed following the standard protocol during bolus administration  of intravenous contrast.  Contrast: OMNIPAQUE IOHEXOL 300 MG/ML  SOLN  Comparison:  PET CT scan 02/03/2013 the  CT CHEST  Findings:  There is a port in the right anterior chest wall.  No axillary lymphadenopathy.  Left supraclavicular lymph nodes is no longer measurable.  No mediastinal or hilar lymphadenopathy.  No pericardial fluid.  Review lung parenchyma demonstrates no suspicious pulmonary nodules.  IMPRESSION:  1.  No evidence of disease progression in the thorax. 2.  Interval decrease in size of mediastinal and supraclavicular lymphadenopathy.  CT ABDOMEN AND PELVIS  Findings:  The bulky peritoneal implants have been reduced dramatically in size.  The largest implant was in the right upper abdomen at measuring 5.3 x 3.7 cm on prior exam and is now no longer detectable.  Likewise smaller implants in the left upper quadrant adjacent to the splenic flexure is no longer discretely measurable.  There is a focus of mesenteric stranding at this site (image 73).  The subcapsular lesion along the right hepatic lobe is now only barely detectable as a 2 mm depression (image 69) decreased from the 13 mm on prior.  Likewise the bulky periaortic retroperitoneal adenopathy is decreased significantly with small shotty lymph nodes left of the aorta measuring 5 mm labs.  Large multiloculated cystic and solid mass within the pelvis is no longer present.   There is tissue in the right adnexa measuring 23 x 15 mm (image 108) which likely represents ovarian tissue.  The left ovary is not enlarged.   Uterus appears normal. Bladder is normal.  No enhancing hepatic lesion.  There are gallstones in the gallbladder.  The  pancreas, spleen, adrenal glands, kidneys are normal.  No aggressive osseous lesions.  IMPRESSION: 1.  Near complete resolution of the bulky the peritoneal implants. 2.  Marked reduction in size of periaortic adenopathy with lymph nodes now less than 5 mm. 3.  Marked reduction in the volume of the cystic and solid pelvic mass with now only a normal  volume of right ovarian tissue measurable.   Original Report Authenticated By: Genevive Bi, M.D.    ASSESSMENT AND PLAN:  1. Issue: Poorly differentiated ovarian carcinoma with metastatic disease to lymph nodes.  - CT scan from 05/09/2013 show good response to treatment.  - The patient is doing well on chemo. She has no dose limiting toxicity. -  She was seen by Dr. [name] who plan to perform exploratory laparotomy total abdominal cystectomy bilateral salpingo-oophorectomy omentectomy after completion of cycle 6 on July 26 2013. - We will proceed with next treatment today. 2. Anxiety/Depression: Mood improved with counseling. She does not want to be on anti depressant.  3. Follow up: Weekly lab and chemo with Taxol (carboplatin is only q3wk). Return visit in 3 weeks.    The length of time of the face-to-face encounter was 25 minutes. More than 50% of time was spent counseling and coordination of care.     Myra Rude, MD   06/01/2013 7:42 PM

## 2013-06-08 ENCOUNTER — Ambulatory Visit (HOSPITAL_BASED_OUTPATIENT_CLINIC_OR_DEPARTMENT_OTHER): Payer: Commercial Managed Care - PPO

## 2013-06-08 ENCOUNTER — Telehealth: Payer: Self-pay | Admitting: Hematology and Oncology

## 2013-06-08 ENCOUNTER — Other Ambulatory Visit: Payer: Self-pay | Admitting: *Deleted

## 2013-06-08 ENCOUNTER — Other Ambulatory Visit: Payer: Commercial Managed Care - PPO | Admitting: Lab

## 2013-06-08 ENCOUNTER — Other Ambulatory Visit (HOSPITAL_BASED_OUTPATIENT_CLINIC_OR_DEPARTMENT_OTHER): Payer: Commercial Managed Care - PPO | Admitting: Lab

## 2013-06-08 VITALS — BP 141/75 | HR 76 | Temp 97.8°F | Resp 16

## 2013-06-08 DIAGNOSIS — Z5111 Encounter for antineoplastic chemotherapy: Secondary | ICD-10-CM

## 2013-06-08 DIAGNOSIS — C561 Malignant neoplasm of right ovary: Secondary | ICD-10-CM

## 2013-06-08 DIAGNOSIS — C779 Secondary and unspecified malignant neoplasm of lymph node, unspecified: Secondary | ICD-10-CM

## 2013-06-08 DIAGNOSIS — C569 Malignant neoplasm of unspecified ovary: Secondary | ICD-10-CM

## 2013-06-08 LAB — CBC WITH DIFFERENTIAL/PLATELET
BASO%: 0.7 % (ref 0.0–2.0)
EOS%: 1.9 % (ref 0.0–7.0)
HCT: 30.5 % — ABNORMAL LOW (ref 34.8–46.6)
LYMPH%: 37.7 % (ref 14.0–49.7)
MCH: 32.7 pg (ref 25.1–34.0)
MCHC: 33.4 g/dL (ref 31.5–36.0)
NEUT%: 53.4 % (ref 38.4–76.8)
Platelets: 174 10*3/uL (ref 145–400)

## 2013-06-08 MED ORDER — SODIUM CHLORIDE 0.9 % IV SOLN
Freq: Once | INTRAVENOUS | Status: AC
  Administered 2013-06-08: 16:00:00 via INTRAVENOUS

## 2013-06-08 MED ORDER — ONDANSETRON 8 MG/50ML IVPB (CHCC)
8.0000 mg | Freq: Once | INTRAVENOUS | Status: AC
  Administered 2013-06-08: 8 mg via INTRAVENOUS

## 2013-06-08 MED ORDER — HEPARIN SOD (PORK) LOCK FLUSH 100 UNIT/ML IV SOLN
500.0000 [IU] | Freq: Once | INTRAVENOUS | Status: AC | PRN
  Administered 2013-06-08: 500 [IU]
  Filled 2013-06-08: qty 5

## 2013-06-08 MED ORDER — SODIUM CHLORIDE 0.9 % IJ SOLN
10.0000 mL | INTRAMUSCULAR | Status: DC | PRN
  Administered 2013-06-08: 10 mL
  Filled 2013-06-08: qty 10

## 2013-06-08 MED ORDER — PACLITAXEL CHEMO INJECTION 300 MG/50ML
80.0000 mg/m2 | Freq: Once | INTRAVENOUS | Status: AC
  Administered 2013-06-08: 132 mg via INTRAVENOUS
  Filled 2013-06-08: qty 22

## 2013-06-08 MED ORDER — DIPHENHYDRAMINE HCL 50 MG/ML IJ SOLN
50.0000 mg | Freq: Once | INTRAMUSCULAR | Status: AC
  Administered 2013-06-08: 50 mg via INTRAVENOUS

## 2013-06-08 MED ORDER — DEXAMETHASONE SODIUM PHOSPHATE 20 MG/5ML IJ SOLN
20.0000 mg | Freq: Once | INTRAMUSCULAR | Status: AC
  Administered 2013-06-08: 20 mg via INTRAVENOUS

## 2013-06-08 MED ORDER — FAMOTIDINE IN NACL 20-0.9 MG/50ML-% IV SOLN
20.0000 mg | Freq: Once | INTRAVENOUS | Status: AC
  Administered 2013-06-08: 20 mg via INTRAVENOUS

## 2013-06-08 NOTE — Patient Instructions (Addendum)
[facility] Discharge Instructions for Patients Receiving Chemotherapy  Today you received the following chemotherapy agents: taxol  To help prevent nausea and vomiting after your treatment, we encourage you to take your nausea medication.  Take it as often as prescribed.     If you develop nausea and vomiting that is not controlled by your nausea medication, call the clinic. If it is after clinic hours your family physician or the after hours number for the clinic or go to the Emergency Department.   BELOW ARE SYMPTOMS THAT SHOULD BE REPORTED IMMEDIATELY:  *FEVER GREATER THAN 100.5 F  *CHILLS WITH OR WITHOUT FEVER  NAUSEA AND VOMITING THAT IS NOT CONTROLLED WITH YOUR NAUSEA MEDICATION  *UNUSUAL SHORTNESS OF BREATH  *UNUSUAL BRUISING OR BLEEDING  TENDERNESS IN MOUTH AND THROAT WITH OR WITHOUT PRESENCE OF ULCERS  *URINARY PROBLEMS  *BOWEL PROBLEMS  UNUSUAL RASH Items with * indicate a potential emergency and should be followed up as soon as possible.  Feel free to call the clinic you have any questions or concerns. The clinic phone number is [phone].   I have been informed and understand all the instructions given to me. I know to contact the clinic, my physician, or go to the Emergency Department if any problems should occur. I do not have any questions at this time, but understand that I Dimattia call the clinic during office hours   should I have any questions or need assistance in obtaining follow up care.    __________________________________________  _____________  __________ Signature of Patient or Authorized Representative            Date                   Time    __________________________________________ Nurse's Signature    

## 2013-06-08 NOTE — Telephone Encounter (Signed)
s.w. pt and advised on all appt for Aug and Sept...pt concerned about weekly tx continuation...emaield Dr. Alecia Lemming, Cameo to extend if needed.

## 2013-06-09 ENCOUNTER — Telehealth: Payer: Self-pay | Admitting: *Deleted

## 2013-06-09 NOTE — Telephone Encounter (Signed)
Informed pt that 9/04 is her last day of chemo per Dr. Alecia Lemming.  He had discussed this w/ dr. [name]. Pt will have completed 6 cycles and her next step is surgery planned in October.  Pt has f/u visit w/ Dr. Darrold Span on 9/10.  Pt verbalized understanding of plan.

## 2013-06-16 ENCOUNTER — Other Ambulatory Visit (HOSPITAL_BASED_OUTPATIENT_CLINIC_OR_DEPARTMENT_OTHER): Payer: Commercial Managed Care - PPO | Admitting: Lab

## 2013-06-16 ENCOUNTER — Ambulatory Visit (HOSPITAL_BASED_OUTPATIENT_CLINIC_OR_DEPARTMENT_OTHER): Payer: Commercial Managed Care - PPO

## 2013-06-16 ENCOUNTER — Other Ambulatory Visit: Payer: Commercial Managed Care - PPO | Admitting: Lab

## 2013-06-16 VITALS — BP 146/71 | HR 76 | Temp 98.5°F

## 2013-06-16 DIAGNOSIS — C569 Malignant neoplasm of unspecified ovary: Secondary | ICD-10-CM

## 2013-06-16 DIAGNOSIS — C561 Malignant neoplasm of right ovary: Secondary | ICD-10-CM

## 2013-06-16 DIAGNOSIS — C779 Secondary and unspecified malignant neoplasm of lymph node, unspecified: Secondary | ICD-10-CM

## 2013-06-16 DIAGNOSIS — Z5111 Encounter for antineoplastic chemotherapy: Secondary | ICD-10-CM

## 2013-06-16 LAB — CBC WITH DIFFERENTIAL/PLATELET
Eosinophils Absolute: 0 10*3/uL (ref 0.0–0.5)
HCT: 30.9 % — ABNORMAL LOW (ref 34.8–46.6)
LYMPH%: 32.6 % (ref 14.0–49.7)
MCV: 99.5 fL (ref 79.5–101.0)
MONO#: 0.6 10*3/uL (ref 0.1–0.9)
MONO%: 13.3 % (ref 0.0–14.0)
NEUT#: 2.2 10*3/uL (ref 1.5–6.5)
NEUT%: 53 % (ref 38.4–76.8)
Platelets: 209 10*3/uL (ref 145–400)
RBC: 3.11 10*6/uL — ABNORMAL LOW (ref 3.70–5.45)
WBC: 4.2 10*3/uL (ref 3.9–10.3)

## 2013-06-16 MED ORDER — FAMOTIDINE IN NACL 20-0.9 MG/50ML-% IV SOLN
20.0000 mg | Freq: Once | INTRAVENOUS | Status: AC
  Administered 2013-06-16: 20 mg via INTRAVENOUS

## 2013-06-16 MED ORDER — DIPHENHYDRAMINE HCL 50 MG/ML IJ SOLN
50.0000 mg | Freq: Once | INTRAMUSCULAR | Status: AC
  Administered 2013-06-16: 50 mg via INTRAVENOUS

## 2013-06-16 MED ORDER — SODIUM CHLORIDE 0.9 % IJ SOLN
10.0000 mL | INTRAMUSCULAR | Status: DC | PRN
  Administered 2013-06-16: 10 mL
  Filled 2013-06-16: qty 10

## 2013-06-16 MED ORDER — ONDANSETRON 8 MG/50ML IVPB (CHCC)
8.0000 mg | Freq: Once | INTRAVENOUS | Status: AC
  Administered 2013-06-16: 8 mg via INTRAVENOUS

## 2013-06-16 MED ORDER — PACLITAXEL CHEMO INJECTION 300 MG/50ML
80.0000 mg/m2 | Freq: Once | INTRAVENOUS | Status: AC
  Administered 2013-06-16: 132 mg via INTRAVENOUS
  Filled 2013-06-16: qty 22

## 2013-06-16 MED ORDER — HEPARIN SOD (PORK) LOCK FLUSH 100 UNIT/ML IV SOLN
500.0000 [IU] | Freq: Once | INTRAVENOUS | Status: AC | PRN
  Administered 2013-06-16: 500 [IU]
  Filled 2013-06-16: qty 5

## 2013-06-16 MED ORDER — DEXAMETHASONE SODIUM PHOSPHATE 20 MG/5ML IJ SOLN
20.0000 mg | Freq: Once | INTRAMUSCULAR | Status: AC
  Administered 2013-06-16: 20 mg via INTRAVENOUS

## 2013-06-16 MED ORDER — SODIUM CHLORIDE 0.9 % IV SOLN
Freq: Once | INTRAVENOUS | Status: AC
  Administered 2013-06-16: 13:00:00 via INTRAVENOUS

## 2013-06-16 NOTE — Patient Instructions (Addendum)
[facility] Discharge Instructions for Patients Receiving Chemotherapy  Today you received the following chemotherapy agents: Taxol.  To help prevent nausea and vomiting after your treatment, we encourage you to take your nausea medication as prescribed.   If you develop nausea and vomiting that is not controlled by your nausea medication, call the clinic.   BELOW ARE SYMPTOMS THAT SHOULD BE REPORTED IMMEDIATELY:  *FEVER GREATER THAN 100.5 F  *CHILLS WITH OR WITHOUT FEVER  NAUSEA AND VOMITING THAT IS NOT CONTROLLED WITH YOUR NAUSEA MEDICATION  *UNUSUAL SHORTNESS OF BREATH  *UNUSUAL BRUISING OR BLEEDING  TENDERNESS IN MOUTH AND THROAT WITH OR WITHOUT PRESENCE OF ULCERS  *URINARY PROBLEMS  *BOWEL PROBLEMS  UNUSUAL RASH Items with * indicate a potential emergency and should be followed up as soon as possible.  Feel free to call the clinic you have any questions or concerns. The clinic phone number is [phone].    

## 2013-06-19 ENCOUNTER — Other Ambulatory Visit: Payer: Self-pay | Admitting: Oncology

## 2013-06-22 ENCOUNTER — Ambulatory Visit: Payer: Commercial Managed Care - PPO | Admitting: Oncology

## 2013-06-22 ENCOUNTER — Ambulatory Visit: Payer: Commercial Managed Care - PPO

## 2013-06-22 ENCOUNTER — Other Ambulatory Visit: Payer: Commercial Managed Care - PPO | Admitting: Lab

## 2013-06-22 ENCOUNTER — Ambulatory Visit (HOSPITAL_BASED_OUTPATIENT_CLINIC_OR_DEPARTMENT_OTHER): Payer: Commercial Managed Care - PPO | Admitting: Oncology

## 2013-06-22 ENCOUNTER — Other Ambulatory Visit (HOSPITAL_BASED_OUTPATIENT_CLINIC_OR_DEPARTMENT_OTHER): Payer: Commercial Managed Care - PPO | Admitting: Lab

## 2013-06-22 VITALS — BP 153/67 | HR 88 | Temp 97.8°F | Resp 18 | Ht 66.0 in | Wt 138.3 lb

## 2013-06-22 DIAGNOSIS — C569 Malignant neoplasm of unspecified ovary: Secondary | ICD-10-CM

## 2013-06-22 DIAGNOSIS — Z23 Encounter for immunization: Secondary | ICD-10-CM

## 2013-06-22 DIAGNOSIS — C561 Malignant neoplasm of right ovary: Secondary | ICD-10-CM

## 2013-06-22 LAB — CBC WITH DIFFERENTIAL/PLATELET
Basophils Absolute: 0 10*3/uL (ref 0.0–0.1)
EOS%: 0.6 % (ref 0.0–7.0)
HCT: 30.7 % — ABNORMAL LOW (ref 34.8–46.6)
HGB: 10.6 g/dL — ABNORMAL LOW (ref 11.6–15.9)
MONO#: 0.4 10*3/uL (ref 0.1–0.9)
MONO%: 9.9 % (ref 0.0–14.0)
NEUT%: 51.5 % (ref 38.4–76.8)
WBC: 4.5 10*3/uL (ref 3.9–10.3)
lymph#: 1.7 10*3/uL (ref 0.9–3.3)

## 2013-06-22 LAB — COMPREHENSIVE METABOLIC PANEL (CC13)
AST: 12 U/L (ref 5–34)
Alkaline Phosphatase: 79 U/L (ref 40–150)
Glucose: 111 mg/dl (ref 70–140)
Potassium: 4.7 mEq/L (ref 3.5–5.1)
Sodium: 143 mEq/L (ref 136–145)
Total Bilirubin: <0.2 mg/dL (ref 0.20–1.20)
Total Protein: 6.3 g/dL — ABNORMAL LOW (ref 6.4–8.3)

## 2013-06-22 MED ORDER — INFLUENZA VIRUS VACC SPLIT PF IM SUSP: 0.5000 mL | Freq: Once | INTRAMUSCULAR | Status: DC

## 2013-06-22 MED ORDER — ALPRAZOLAM 0.5 MG PO TABS: 0.5000 mg | ORAL_TABLET | Freq: Three times a day (TID) | ORAL | Status: DC | PRN

## 2013-06-22 MED ORDER — INFLUENZA VAC SPLIT QUAD 0.5 ML IM SUSP
0.5000 mL | Freq: Once | INTRAMUSCULAR | Status: AC
  Administered 2013-06-22: 0.5 mL via INTRAMUSCULAR
  Filled 2013-06-22: qty 0.5

## 2013-06-22 NOTE — Progress Notes (Signed)
OFFICE PROGRESS NOTE   06/22/2013   Physicians: Laurette Schimke, Primus Bravo (PA, PCP, [facility]), Allegra Grana), Ritta Slot  INTERVAL HISTORY:  Patient is a very pleasant 57 yo lady  With stage IVB gyn carcinoma, whom I am seeing for the first time today, previously followed for medical oncology by Dr [name]. She has been treated with 6 cycles of dose dense taxol carboplatin chemotherapy from 02-09-13 thru 06-16-2013 with excellent clinical response. Plan is for interval surgery by gyn oncology on 07-26-13, then additional chemotherapy if residual disease is documented. She has PAC, this placed by IR in [place] 2014 and last flushed with treatment 06-16-2013.  Patient has had mild nausea controlled with zofran, mild fatigue tho still able to do her part time work from home, unchanged occasional low pelvic discomfort L>R as she has had since diagnosis.   ONCOLOGIC HISTORY Patient has seen Dr Leonette Most Lomax/ NP Eve regularly for years and was up to date on gyn exams at the time of this diagnosis. She presented with several months of slowly enlarging left supraclavicular adenopathy, measuring ~ 2 cm at time of biopsy by Dr Joline Maxcy 01-22-12. Pathology [phone]) showed metastatic adenocarcinoma with immunohistochemical profile consistent with gyn vs breast primary, including ER PR +. CA 125 was 1202. Breast imaging 01-28-13 at [facility] (bilateral diagnostic mammograms and [place]) had no findings of concern. PET/CT 02-03-13 in [facility] system found bilateral hypermetabolic supraclavicular and mediastinal adenopathy; 8.7 x 8.7 hypermetabolic cystic and solid pelvic mass with adjacent similar mass in right iliac fossa; multiple hypermetabolic peritoneal implants including area 5.5 x 3.5 cm at liver margin; no pulmonary or bony lesions. She was seen by Dr Laurette Schimke, with recommendation for dose dense taxol carboplatin, this given x 6 cycles from 02-09-2013 thru 06-16-2013, tolerated generally  well without gCSF or any dose reductions. CT CAP 05-09-13 after 4 cycles of chemotherapy found radiographic resolution of supraclavicular adenopathy, no findings of concern in chest, near complete resolution of bulky peritoneal implants and marked improvement in abdominal and pelvic mass. She had two additional cycles of chemotherapy thru 06-16-2013. She is scheduled for surgery by Dr [name] on 07-26-13.  Review of systems as above, also: No fever or symptoms of infection, no bleeding. No peripheral neuropathy symptoms. No problems with PAC. Some constipation with zofran, not using laxatives. No taste since on chemo. Weight up several pounds since starting chemo, also stopped smoking then; had previously lost some weight so that she is now back to usual good weight. No increased SOB or other respiratory symptoms. No other pain. Bladder ok. No LE swelling.  Remainder of 10 point Review of Systems negative.   Past Medical/ Surgical History Intolerance to codeine Mitral valve prolapse not symptomatic HTN x 2 years Colonoscopy 2013 by Dr Ritta Slot [facility]13 G3P3 LEEP 20 yrs ago by Dr Nicholas Lose Wisdom teeth extracted  Family History Mother living, GERD and pulmonary disorder Father MI Son with hx testicular cancer age 43, doing well now  COPD, dementia, melanoma in aunts/ uncles  Social History Lives with husband in [place]. 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 2 daughters, 1 son, no grandchildren.Smoked 1 ppd x 39 years, DCd with cancer diagnosis 01-2013   Objective:  Vital signs in last 24 hours:  BP 153/67  Pulse 88  Temp(Src) 97.8 F (36.6 C) (Oral)  Resp 18  Ht 5\' 6"  (1.676 m)  Wt 138 lb 4.8 oz (62.732 kg)  BMI 22.33 kg/m2  Alert, oriented and appropriate, very pleasant and good historian. Ambulatory without difficulty.  Alopecia  HEENT:PERRL, sclerae not icteric. Oral mucosa moist without lesions, posterior pharynx  clear.  Neck supple. No JVD. No apparent thyroid mass. Lymphatics No cervical,suraclavicular, axillary or inguinal adenopathy Resp: hyperresonant to percussion and diminished BS thruout, otherwise clear to A & P Cardio: regular rate and rhythm. No gallop. GI: soft, nontender, not distended, no mass or organomegaly. Extremities: without pitting edema, cords, tenderness Neuro: no peripheral neuropathy. Otherwise nonfocal Skin without rash, ecchymosis, petechiae Breasts: without dominant mass, skin or nipple findings. Axillae benign. Portacath-without erythema or tenderness  Lab Results:  Results for orders placed in visit on 06/22/13  CBC WITH DIFFERENTIAL      Result Value Range   WBC 4.5  3.9 - 10.3 10e3/uL   NEUT# 2.3  1.5 - 6.5 10e3/uL   HGB 10.6 (*) 11.6 - 15.9 g/dL   HCT 78.2 (*) 95.6 - 21.3 %   Platelets 178  145 - 400 10e3/uL   MCV 99.3  79.5 - 101.0 fL   MCH 34.3 (*) 25.1 - 34.0 pg   MCHC 34.6  31.5 - 36.0 g/dL   RBC 0.86 (*) 5.78 - 4.69 10e6/uL   RDW 14.1  11.2 - 14.5 %   lymph# 1.7  0.9 - 3.3 10e3/uL   MONO# 0.4  0.1 - 0.9 10e3/uL   Eosinophils Absolute 0.0  0.0 - 0.5 10e3/uL   Basophils Absolute 0.0  0.0 - 0.1 10e3/uL   NEUT% 51.5  38.4 - 76.8 %   LYMPH% 37.2  14.0 - 49.7 %   MONO% 9.9  0.0 - 14.0 %   EOS% 0.6  0.0 - 7.0 %   BASO% 0.8  0.0 - 2.0 %  COMPREHENSIVE METABOLIC PANEL (CC13)      Result Value Range   Sodium 143  136 - 145 mEq/L   Potassium 4.7  3.5 - 5.1 mEq/L   Chloride 107  98 - 109 mEq/L   CO2 31 (*) 22 - 29 mEq/L   Glucose 111  70 - 140 mg/dl   BUN 62.9  7.0 - 52.8 mg/dL   Creatinine 0.7  0.6 - 1.1 mg/dL   Total Bilirubin <4.13  0.20 - 1.20 mg/dL   Alkaline Phosphatase 79  40 - 150 U/L   AST 12  5 - 34 U/L   ALT 11  0 - 55 U/L   Total Protein 6.3 (*) 6.4 - 8.3 g/dL   Albumin 3.6  3.5 - 5.0 g/dL   Calcium 9.3  8.4 - 24.4 mg/dL   CA 010 available after visit down to 13.7, this having been 1202 at presentation and 40 after 4 cycles of  chemotherapy  Studies/Results:   CT CHEST, ABDOMEN AND PELVIS WITH CONTRAST  05-09-13 Comparison: PET CT scan 02/03/2013   CT CHEST  Findings: There is a port in the right anterior chest wall. No  axillary lymphadenopathy. Left supraclavicular lymph nodes is no  longer measurable. No mediastinal or hilar lymphadenopathy. No  pericardial fluid.  Review lung parenchyma demonstrates no suspicious pulmonary  nodules.  IMPRESSION:  1. No evidence of disease progression in the thorax.  2. Interval decrease in size of mediastinal and supraclavicular  lymphadenopathy.  CT ABDOMEN AND PELVIS  Findings: The bulky peritoneal implants have been reduced  dramatically in size. The largest implant was in the right upper  abdomen at measuring 5.3 x 3.7 cm on prior exam and  is now no  longer detectable. Likewise smaller implants in the left upper  quadrant adjacent to the splenic flexure is no longer discretely  measurable. There is a focus of mesenteric stranding at this site  (image 73). The subcapsular lesion along the right hepatic lobe is  now only barely detectable as a 2 mm depression (image 69)  decreased from the 13 mm on prior.  Likewise the bulky periaortic retroperitoneal adenopathy is  decreased significantly with small shotty lymph nodes left of the  aorta measuring 5 mm labs.  Large multiloculated cystic and solid mass within the pelvis is no  longer present. There is tissue in the right adnexa measuring 23  x 15 mm (image 108) which likely represents ovarian tissue. The  left ovary is not enlarged. Uterus appears normal. Bladder is  normal.  No enhancing hepatic lesion. There are gallstones in the  gallbladder. The pancreas, spleen, adrenal glands, kidneys are  normal.  No aggressive osseous lesions.  IMPRESSION:  1. Near complete resolution of the bulky the peritoneal implants.  2. Marked reduction in size of periaortic adenopathy with lymph  nodes now less than 5 mm.  3.  Marked reduction in the volume of the cystic and solid pelvic  mass with now only a normal volume of right ovarian tissue  measurable.  Original Report Authenticated By: Genevive Bi, M.D.  Medications: I have reviewed the patient's current medications. She agrees to flu vaccine today, which has been done now.  Assessment/Plan: 1.IVB adenocarcinoma of gyn primary: left supraclavicular node + by biopsy at presentation 01-2013. Excellent response clinically to 6 cycles of dose dense taxol carboplatin, which she tolerated well. Gyn oncology plans surgical evaluation with [facility] 07-26-13. I will see her back after surgery, that appointment to be set up as appropriate depending on pathology findings and her disposition after surgery. 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 If she has screening mammograms next, should consider 3D/ tomo   Patient has had questions answered to her satisfaction and is in agreement with plan. All of above information reviewed with this visit. She knows that she can call at any time if needed.  Danylle Ouk P, MD   06/22/2013, 3:04 PM

## 2013-06-22 NOTE — Patient Instructions (Signed)
Portacath will need flush between Oct 16 and Oct 30 if not used before then.

## 2013-06-23 LAB — CA 125: CA 125: 13.7 U/mL (ref 0.0–30.2)

## 2013-06-24 ENCOUNTER — Encounter: Payer: Self-pay | Admitting: Oncology

## 2013-06-24 ENCOUNTER — Ambulatory Visit: Payer: Commercial Managed Care - PPO | Admitting: Oncology

## 2013-06-24 NOTE — Progress Notes (Signed)
[facility] END OF TREATMENT   Name: Deborah Macdonald Date: 06/24/2013 MRN: [id number] DOB: [birth year]   TREATMENT DATES: 02-09-2013 thru 06-16-2013   REFERRING PHYSICIAN: Joline Maxcy  DIAGNOSIS: gyn adenocarcinoma  STAGE AT START OF TREATMENT: IVB (involvement of supraclavicular nodes)   INTENT:control   DRUGS OR REGIMENS GIVEN: dose dense taxol carboplatin   MAJOR TOXICITIES: fatigue, mild nausea   REASON TREATMENT STOPPED: completion of planned course   PERFORMANCE STATUS AT END: 1   ONGOING PROBLEMS: mild fatigue, mild anemia   FOLLOW UP PLANS: surgery by gyn oncology planned ~ 07-26-13

## 2013-06-30 ENCOUNTER — Ambulatory Visit: Payer: Commercial Managed Care - PPO | Attending: Gynecologic Oncology | Admitting: Gynecologic Oncology

## 2013-06-30 VITALS — BP 136/82 | HR 80 | Temp 97.7°F | Resp 16 | Ht 66.0 in | Wt 139.6 lb

## 2013-06-30 DIAGNOSIS — C569 Malignant neoplasm of unspecified ovary: Secondary | ICD-10-CM

## 2013-06-30 NOTE — Progress Notes (Signed)
Pre-operative Note: Gyn-Onc  Deborah Macdonald 56 y.o. female  CC:  Chief Complaint  Patient presents with  . Pre-op Exam    Follow up     HPI:  Deborah Macdonald is a 57 year old, gravida 3 para 3, who was noted to have left cervical adenopathy in September 2013. She noted that it was a slowly increasing in size and the initial workup was for evaluation for lung disease. She underwent excision of supraclavicular LN on 01/21/2013 The malignant cells are positive for cytokeratin 7, estrogen receptor, progesterone receptor, and focally for p53. They are negative for cytokeratin 20, GCDFP, Napsin-A, , thyroglobulin, and TTF-1. This immunohistochemical profile raises the possibility of metastatic gynecologic carcinoma or metastatic breast carcinoma. The former is favored. 01/27/13 PET IMPRESSION: 1. 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 2. Bulky peritoneal metastasis in the upper abdomen and along the capsule of the liver. 3. Hypermetabolic retroperitoneal nodal metastasis. 4. Small hypermetabolic mediastinal nodal metastasis. 5. Supraclavicular hypermetabolic nodal metastasis.    She has recently completed six cycles of carbo/taxol, last on 06/16/13.  CT chest/abdomen/pelvis on 05/09/13 prior to completion of chemotherapy resulted:  1. Near complete resolution of the bulky the peritoneal implants. 2. Marked reduction in size of periaortic adenopathy with lymph nodes now less than 5 mm. 3. Marked reduction in the volume of the cystic and solid pelvic mass with now only a normal volume of right ovarian tissue measurable. 1. No evidence of disease progression in the thorax. 2. Interval decrease in size of mediastinal and supraclavicular lymphadenopathy.  CA 125 on 05/11/13 was 39.9.  At her last visit with Dr. [name] on 05/23/13, it was recommended that she undergo surgical intervention including an  exploratory laparotomy, total abdominal hysterectomy, bilateral salpingo-oophorectomy, omentectomy, possible debulking.      Interval History:  She presents today for pre-operative evaluation.  She reports doing well since her last visit with no complaints voiced.  She tolerated her sixth cycle of chemotherapy well.      Review of Systems  Constitutional: Feels well.  No fever, chills, early satiety, or unintentional weight loss or gain.  Cardiovascular: No chest pain, shortness of breath, or edema.  Pulmonary: No cough or wheeze.  Gastrointestinal: No nausea, vomiting, or diarrhea. No bright red blood per rectum or change in bowel movement.  Genitourinary: No frequency, urgency, or dysuria. No vaginal bleeding or discharge.  Musculoskeletal: No myalgia or joint pain. Neurologic: No weakness, numbness, or change in gait.  Psychology: No depression, anxiety, or insomnia.  Current Meds:  Outpatient Encounter Prescriptions as of 06/30/2013  Medication Sig Dispense Refill  . ALPRAZolam (XANAX) 0.5 MG tablet Take 1 tablet (0.5 mg total) by mouth 3 (three) times daily as needed.  60 tablet  0  . Aspirin-Salicylamide-Caffeine (BC HEADACHE POWDER PO) Take 1 packet by mouth every 6 (six) hours as needed (headache).      . cloNIDine (CATAPRES) 0.1 MG tablet Take 1 tablet (0.1 mg total) by mouth daily.  60 tablet  11  . escitalopram (LEXAPRO) 5 MG tablet Take 5 mg by mouth daily.      Marland Kitchen loratadine (CLARITIN) 10 MG tablet Take 10 mg by mouth daily.      Marland Kitchen triamterene-hydrochlorothiazide (MAXZIDE-25) 37.5-25 MG per tablet Take 1 tablet by mouth daily.      Marland Kitchen lidocaine-prilocaine (EMLA) cream Apply topically as needed. Apply to skin over Deborah Macdonald site at  least one hour prior to needle stick  30 g  3  . ondansetron (ZOFRAN) 8 MG tablet Take 1 tablet (8 mg total) by mouth 2 (two) times daily. Twice daily starting the day after chemo for 3 days then as needed.  30 tablet  3  . prochlorperazine (COMPAZINE)  10 MG tablet Take 1 tablet (10 mg total) by mouth every 6 (six) hours as needed (Nausea or vomiting).  30 tablet  1  . zolpidem (AMBIEN) 5 MG tablet Take 1 tablet (5 mg total) by mouth at bedtime as needed for sleep.  30 tablet  3   No facility-administered encounter medications on file as of 06/30/2013.    Allergy:  Allergies  Allergen Reactions  . Codeine Nausea Only    Social Hx:   History   Social History  . Marital Status: Married    Spouse Name: N/A    Number of Children: 3  . Years of Education: N/A   Occupational History  . COLLECTIONS     Social History Main Topics  . Smoking status: Former Smoker -- 1.00 packs/day for 39 years    Quit date: 01/13/2013  . Smokeless tobacco: Never Used  . Alcohol Use: 1.0 oz/week    2 drink(s) per week  . Drug Use: No  . Sexual Activity: Not on file   Other Topics Concern  . Not on file   Social History Narrative  . No narrative on file    Past Surgical Hx:  Past Surgical History  Procedure Laterality Date  . Wisdom tooth extraction    . Lymph node biopsy Left 01/21/2013    Procedure: EXCISIONAL BIOPSY OF THE NECK;  Surgeon: Serena Colonel, MD;  Location: [facility] OR;  Service: ENT;  Laterality: Left;  . [place]ep      20 year ago    Past Medical Hx:  Past Medical History  Diagnosis Date  . PONV (postoperative nausea and vomiting)   . MVP (mitral valve prolapse)   . Hypertension   . Anxiety   . Shortness of breath     at times "anxiety"  . Ovarian cancer on right   . Regional lymph node metastasis present     Family Hx:  Family History  Problem Relation Age of Onset  . Heart attack Father   . Melanoma Maternal Uncle     dx in his 74s; mets to brain  . Dementia Maternal Grandmother   . COPD Maternal Grandfather   . Dementia Paternal Grandmother   . COPD Paternal Grandfather   . Testicular cancer Son 14    Vitals:  Blood pressure 136/82, pulse 80, temperature 97.7 F (36.5 C), temperature source Oral, resp. rate 16,  height 5\' 6"  (1.676 m), weight 139 lb 9.6 oz (63.322 kg).  Physical Exam:  General: Well developed, well nourished female in no acute distress. Alert and oriented x 3.  Cardiovascular: Regular rate and rhythm. S1 and S2 normal.  Lungs: Clear to auscultation bilaterally. No wheezes/crackles/rhonchi noted.  Skin: No rashes or lesions present. Back: No CVA tenderness.  Extremities: No bilateral cyanosis, edema, or clubbing.   Assessment/Plan:  Deborah Macdonald is a. 57 year old with stage IV B. ovarian carcinoma. The initial presentation was that of extra-abdominal lymphadenopathy, metastatic disease within the supraclavicular lymph nodes, and CA 125 elevated to 1202.4.  It was recommended at that time that she receive 3-4 cycles of dose dense neoadjuvant Taxol platinum therapy followed by repeat imaging after cycle 3 with  consideration for interval debulking if the extra-abdominal disease resolves. The patient has completed 6 cycles of carbo/taxol. Last CA 125 is 39.9.  She will be scheduled for an exploratory laparotomy, total abdominal hysterectomy, bilateral salpingo-oophorectomy, omentectomy, and possible debulking by Dr. Laurette Schimke on 07/26/13.  The risks of the procedure were discussed with the patient and husband and were inclusive of infection, bleeding, damage to surrounding structures, prolonged hospitalization, and reoperation. The patient and her husband's questions were answered to her satisfaction at this time.  The patient is aware that if residual disease is identified at the time of surgery additional cycles of chemotherapy will be recommended.  She would also like to meet with anesthesia prior to surgery due to a history of moderate post-operative nausea and vomiting.  She is advised to call for any questions or concerns.  The patient was reviewed with Dr. [name], who agrees with the above plan.   Macdonald, Deborah DEAL, NP 06/30/2013, 5:11 PM

## 2013-07-01 ENCOUNTER — Encounter: Payer: Self-pay | Admitting: Gynecologic Oncology

## 2013-07-01 NOTE — Addendum Note (Signed)
Addended by: Warner Mccreedy D on: 07/01/2013 10:00 AM   Modules accepted: Level of Service

## 2013-07-04 ENCOUNTER — Telehealth: Payer: Self-pay | Admitting: Genetic Counselor

## 2013-07-04 NOTE — Telephone Encounter (Signed)
Revealed negative genetic testing on the breast/ovarian cancer panel.   

## 2013-07-05 ENCOUNTER — Encounter: Payer: Self-pay | Admitting: Genetic Counselor

## 2013-07-12 ENCOUNTER — Encounter (HOSPITAL_COMMUNITY): Payer: Self-pay | Admitting: Pharmacy Technician

## 2013-07-19 NOTE — Progress Notes (Signed)
ekg 01-20-2013 epic

## 2013-07-19 NOTE — Patient Instructions (Addendum)
20 Deborah Macdonald  07/19/2013   Your procedure is scheduled on: 07-26-2013  Report to [facility] at 600  AM.  Call this number if you have problems the morning of surgery [phone]   Remember:   Do not eat food  :After Midnight Sunday night              Clear liquids all day Monday 07-25-2013, no liquids after midnight Monday night     Take these medicines the morning of surgery with A SIP OF WATER:  Claritin                                                      [place] take Aprazolam if needed                                SEE [facility] PREPARING FOR SURGERY SHEET             You [place] not have any metal on your body including hair pins and piercings  Do not wear jewelry, make-up.  Do not wear lotions, powders, or perfumes. You Wailes wear deodorant.   Men [place] shave face and neck.  Do not bring valuables to the hospital. [facility] IS NOT RESPONSIBLE FOR VALUEABLES.  Contacts, dentures or bridgework [place] not be worn into surgery.  Leave suitcase in the car. After surgery it [place] be brought to your room.  For patients admitted to the hospital, checkout time is 11:00 AM the day of discharge.   Patients discharged the day of surgery will not be allowed to drive home.  Name and phone number of your driver:  Special Instructions: N/A   Please read over the following fact sheets that you were given: incentive spirometry sheet, clear liquid sheet  Call Theodis Aguas RN pre op nurse if needed [phone]       TYPE AND SCREEN TO BE DRAWN MORNING OF SURGERY  FAILURE TO FOLLOW THESE INSTRUCTIONS [place] RESULT IN THE CANCELLATION OF YOUR SURGERY.  PATIENT SIGNATURE___________________________________________  NURSE SIGNATURE_____________________________________________

## 2013-07-20 ENCOUNTER — Encounter (HOSPITAL_COMMUNITY): Payer: Self-pay

## 2013-07-20 ENCOUNTER — Ambulatory Visit (HOSPITAL_COMMUNITY)
Admission: RE | Admit: 2013-07-20 | Discharge: 2013-07-20 | Disposition: A | Discharge status: Home / Self Care | Payer: Commercial Managed Care - PPO | Source: Ambulatory Visit | Attending: Gynecologic Oncology | Admitting: Gynecologic Oncology

## 2013-07-20 ENCOUNTER — Encounter (HOSPITAL_COMMUNITY)
Admission: RE | Admit: 2013-07-20 | Discharge: 2013-07-20 | Disposition: A | Discharge status: Home / Self Care | Payer: Commercial Managed Care - PPO | Source: Ambulatory Visit | Attending: Gynecologic Oncology | Admitting: Gynecologic Oncology

## 2013-07-20 DIAGNOSIS — I700 Atherosclerosis of aorta: Secondary | ICD-10-CM | POA: Insufficient documentation

## 2013-07-20 DIAGNOSIS — C569 Malignant neoplasm of unspecified ovary: Secondary | ICD-10-CM | POA: Insufficient documentation

## 2013-07-20 DIAGNOSIS — Z01812 Encounter for preprocedural laboratory examination: Secondary | ICD-10-CM | POA: Insufficient documentation

## 2013-07-20 DIAGNOSIS — Z01818 Encounter for other preprocedural examination: Secondary | ICD-10-CM | POA: Insufficient documentation

## 2013-07-20 DIAGNOSIS — I10 Essential (primary) hypertension: Secondary | ICD-10-CM | POA: Insufficient documentation

## 2013-07-20 HISTORY — DX: Mental disorder, not otherwise specified: F99

## 2013-07-20 LAB — COMPREHENSIVE METABOLIC PANEL
ALT: 16 U/L (ref 0–35)
AST: 18 U/L (ref 0–37)
Alkaline Phosphatase: 81 U/L (ref 39–117)
CO2: 28 mEq/L (ref 19–32)
Calcium: 9.4 mg/dL (ref 8.4–10.5)
Chloride: 103 mEq/L (ref 96–112)
GFR calc Af Amer: >90 mL/min (ref >90)
Glucose, Bld: 120 mg/dL — ABNORMAL HIGH (ref 70–99)
Potassium: 4.1 mEq/L (ref 3.5–5.1)
Sodium: 140 mEq/L (ref 135–145)
Total Bilirubin: 0.1 mg/dL — ABNORMAL LOW (ref 0.3–1.2)
Total Protein: 6.8 g/dL (ref 6.0–8.3)

## 2013-07-20 LAB — CBC WITH DIFFERENTIAL/PLATELET
Basophils Absolute: 0 10*3/uL (ref 0.0–0.1)
Eosinophils Absolute: 0.2 10*3/uL (ref 0.0–0.7)
Eosinophils Relative: 2 % (ref 0–5)
Lymphocytes Relative: 26 % (ref 12–46)
Lymphs Abs: 1.9 10*3/uL (ref 0.7–4.0)
MCH: 32.9 pg (ref 26.0–34.0)
Neutrophils Relative %: 64 % (ref 43–77)
Platelets: 256 10*3/uL (ref 150–400)
RBC: 3.95 MIL/uL (ref 3.87–5.11)
RDW: 12.5 % (ref 11.5–15.5)
WBC: 7.3 10*3/uL (ref 4.0–10.5)

## 2013-07-20 LAB — URINALYSIS, ROUTINE W REFLEX MICROSCOPIC
Bilirubin Urine: NEGATIVE
Hgb urine dipstick: NEGATIVE
Ketones, ur: NEGATIVE mg/dL
Protein, ur: NEGATIVE mg/dL
Specific Gravity, Urine: 1.012 (ref 1.005–1.030)
Urobilinogen, UA: 0.2 mg/dL (ref 0.0–1.0)
pH: 6 (ref 5.0–8.0)

## 2013-07-20 NOTE — Progress Notes (Signed)
Per EPIC note- u/a and micro reviewed by Alonza Smoker NP

## 2013-07-25 ENCOUNTER — Telehealth: Payer: Self-pay | Admitting: Gynecologic Oncology

## 2013-07-25 NOTE — Telephone Encounter (Signed)
Telephone call to check on pre-operative status.  Patient complaint with pre-operative instructions.  Reinforced NPO after midnight.  No questions or concerns voiced.  Instructed to call for any needs. 

## 2013-07-25 NOTE — Anesthesia Preprocedure Evaluation (Addendum)
Anesthesia Evaluation  Patient identified by MRN, date of birth, ID band Patient awake    Reviewed: Allergy & Precautions, H&P , NPO status , Patient's Chart, lab work & pertinent test results  History of Anesthesia Complications (+) PONV  Airway Mallampati: II TM Distance: >3 FB Neck ROM: full    Dental no notable dental hx. (+) Teeth Intact and Dental Advisory Given   Pulmonary neg pulmonary ROS, shortness of breath and with exertion,  breath sounds clear to auscultation  Pulmonary exam normal       Cardiovascular Exercise Tolerance: Good hypertension, Pt. on medications negative cardio ROS  + Valvular Problems/Murmurs MVP Rhythm:regular Rate:Normal     Neuro/Psych Anxiety negative neurological ROS  negative psych ROS   GI/Hepatic negative GI ROS, Neg liver ROS,   Endo/Other  negative endocrine ROS  Renal/GU negative Renal ROS  negative genitourinary   Musculoskeletal   Abdominal   Peds  Hematology negative hematology ROS (+)   Anesthesia Other Findings Ovarian CA  Reproductive/Obstetrics negative OB ROS                          Anesthesia Physical Anesthesia Plan  ASA: III  Anesthesia Plan: General   Post-op Pain Management:    Induction: Intravenous  Airway Management Planned: Oral ETT  Additional Equipment:   Intra-op Plan:   Post-operative Plan: Extubation in OR  Informed Consent: I have reviewed the patients History and Physical, chart, labs and discussed the procedure including the risks, benefits and alternatives for the proposed anesthesia with the patient or authorized representative who has indicated his/her understanding and acceptance.   Dental Advisory Given  Plan Discussed with: CRNA and Surgeon  Anesthesia Plan Comments:         Anesthesia Quick Evaluation

## 2013-07-26 ENCOUNTER — Inpatient Hospital Stay (HOSPITAL_COMMUNITY): Payer: Commercial Managed Care - PPO | Admitting: Anesthesiology

## 2013-07-26 ENCOUNTER — Encounter (HOSPITAL_COMMUNITY)
Admission: RE | Disposition: A | Discharge status: Home / Self Care | Payer: Self-pay | Source: Ambulatory Visit | Attending: Obstetrics & Gynecology

## 2013-07-26 ENCOUNTER — Encounter (HOSPITAL_COMMUNITY): Payer: Commercial Managed Care - PPO | Admitting: Anesthesiology

## 2013-07-26 ENCOUNTER — Encounter (HOSPITAL_COMMUNITY): Payer: Self-pay | Admitting: *Deleted

## 2013-07-26 ENCOUNTER — Inpatient Hospital Stay (HOSPITAL_COMMUNITY)
Admission: RE | Admit: 2013-07-26 | Discharge: 2013-07-29 | DRG: 737 | Disposition: A | Discharge status: Home / Self Care | Payer: Commercial Managed Care - PPO | Source: Ambulatory Visit | Attending: Obstetrics & Gynecology | Admitting: Obstetrics & Gynecology

## 2013-07-26 DIAGNOSIS — Z9221 Personal history of antineoplastic chemotherapy: Secondary | ICD-10-CM

## 2013-07-26 DIAGNOSIS — I10 Essential (primary) hypertension: Secondary | ICD-10-CM | POA: Diagnosis present

## 2013-07-26 DIAGNOSIS — F411 Generalized anxiety disorder: Secondary | ICD-10-CM | POA: Diagnosis present

## 2013-07-26 DIAGNOSIS — C569 Malignant neoplasm of unspecified ovary: Principal | ICD-10-CM | POA: Diagnosis present

## 2013-07-26 DIAGNOSIS — C786 Secondary malignant neoplasm of retroperitoneum and peritoneum: Secondary | ICD-10-CM | POA: Diagnosis present

## 2013-07-26 DIAGNOSIS — I059 Rheumatic mitral valve disease, unspecified: Secondary | ICD-10-CM | POA: Diagnosis present

## 2013-07-26 DIAGNOSIS — Z87891 Personal history of nicotine dependence: Secondary | ICD-10-CM

## 2013-07-26 DIAGNOSIS — C561 Malignant neoplasm of right ovary: Secondary | ICD-10-CM | POA: Diagnosis present

## 2013-07-26 DIAGNOSIS — C771 Secondary and unspecified malignant neoplasm of intrathoracic lymph nodes: Secondary | ICD-10-CM | POA: Diagnosis present

## 2013-07-26 HISTORY — PX: ABDOMINAL HYSTERECTOMY: SHX81

## 2013-07-26 LAB — CBC
Hemoglobin: 12.1 g/dL (ref 12.0–15.0)
MCH: 32.5 pg (ref 26.0–34.0)
MCHC: 33.5 g/dL (ref 30.0–36.0)
Platelets: 207 10*3/uL (ref 150–400)

## 2013-07-26 LAB — TYPE AND SCREEN
ABO/RH(D): A POS
Antibody Screen: NEGATIVE

## 2013-07-26 SURGERY — HYSTERECTOMY, ABDOMINAL
Anesthesia: General | Laterality: Bilateral | Wound class: Clean Contaminated

## 2013-07-26 MED ORDER — ACETAMINOPHEN 500 MG PO TABS
1000.0000 mg | ORAL_TABLET | Freq: Four times a day (QID) | ORAL | Status: DC
  Administered 2013-07-26 – 2013-07-29 (×12): 1000 mg via ORAL
  Filled 2013-07-26 (×17): qty 2

## 2013-07-26 MED ORDER — LACTATED RINGERS IV SOLN: INTRAVENOUS | Status: DC

## 2013-07-26 MED ORDER — STERILE WATER FOR IRRIGATION IR SOLN
Status: DC | PRN
  Administered 2013-07-26: 1500 mL

## 2013-07-26 MED ORDER — FENTANYL CITRATE 0.05 MG/ML IJ SOLN
INTRAMUSCULAR | Status: AC
  Administered 2013-07-26: 11:00:00
  Filled 2013-07-26: qty 2

## 2013-07-26 MED ORDER — CEFAZOLIN SODIUM-DEXTROSE 2-3 GM-% IV SOLR
INTRAVENOUS | Status: AC
  Filled 2013-07-26: qty 50

## 2013-07-26 MED ORDER — FENTANYL CITRATE 0.05 MG/ML IJ SOLN
INTRAMUSCULAR | Status: AC
  Administered 2013-07-26: 12:00:00
  Filled 2013-07-26: qty 2

## 2013-07-26 MED ORDER — PROMETHAZINE HCL 25 MG/ML IJ SOLN
12.5000 mg | INTRAMUSCULAR | Status: DC | PRN
  Administered 2013-07-26: 12.5 mg via INTRAVENOUS

## 2013-07-26 MED ORDER — SODIUM CHLORIDE 0.9 % IJ SOLN
INTRAMUSCULAR | Status: AC
  Filled 2013-07-26: qty 20

## 2013-07-26 MED ORDER — SCOPOLAMINE 1 MG/3DAYS TD PT72
MEDICATED_PATCH | TRANSDERMAL | Status: DC | PRN
  Administered 2013-07-26: 1 via TRANSDERMAL

## 2013-07-26 MED ORDER — BUPIVACAINE LIPOSOME 1.3 % IJ SUSP
20.0000 mL | Freq: Once | INTRAMUSCULAR | Status: DC
  Administered 2013-07-26: 266 mg
  Filled 2013-07-26: qty 20

## 2013-07-26 MED ORDER — FENTANYL CITRATE 0.05 MG/ML IJ SOLN
INTRAMUSCULAR | Status: DC | PRN
  Administered 2013-07-26: 50 ug via INTRAVENOUS
  Administered 2013-07-26: 100 ug via INTRAVENOUS
  Administered 2013-07-26 (×4): 50 ug via INTRAVENOUS

## 2013-07-26 MED ORDER — OXYCODONE HCL 5 MG PO TABS
10.0000 mg | ORAL_TABLET | ORAL | Status: DC | PRN
  Filled 2013-07-26: qty 2

## 2013-07-26 MED ORDER — MAGNESIUM HYDROXIDE 400 MG/5ML PO SUSP
30.0000 mL | Freq: Three times a day (TID) | ORAL | Status: AC
Start: 2013-07-26 — End: 2013-07-27
  Administered 2013-07-26 – 2013-07-27 (×3): 30 mL via ORAL
  Filled 2013-07-26 (×3): qty 30

## 2013-07-26 MED ORDER — SCOPOLAMINE 1 MG/3DAYS TD PT72
MEDICATED_PATCH | TRANSDERMAL | Status: AC
  Filled 2013-07-26: qty 1

## 2013-07-26 MED ORDER — KETOROLAC TROMETHAMINE 30 MG/ML IJ SOLN
15.0000 mg | Freq: Four times a day (QID) | INTRAMUSCULAR | Status: AC
  Administered 2013-07-26 – 2013-07-27 (×4): 15 mg via INTRAVENOUS
  Filled 2013-07-26 (×3): qty 1

## 2013-07-26 MED ORDER — ENOXAPARIN SODIUM 40 MG/0.4ML ~~LOC~~ SOLN
40.0000 mg | SUBCUTANEOUS | Status: DC
  Administered 2013-07-27 – 2013-07-29 (×3): 40 mg via SUBCUTANEOUS
  Filled 2013-07-26 (×4): qty 0.4

## 2013-07-26 MED ORDER — CLONIDINE HCL 0.1 MG PO TABS
0.1000 mg | ORAL_TABLET | Freq: Every day | ORAL | Status: DC
  Administered 2013-07-26 – 2013-07-28 (×3): 0.1 mg via ORAL
  Filled 2013-07-26 (×4): qty 1

## 2013-07-26 MED ORDER — HEPARIN SODIUM (PORCINE) 5000 UNIT/ML IJ SOLN
5000.0000 [IU] | INTRAMUSCULAR | Status: AC
Start: 2013-07-26 — End: 2013-07-26
  Administered 2013-07-26: 5000 [IU] via SUBCUTANEOUS
  Filled 2013-07-26: qty 1

## 2013-07-26 MED ORDER — ENSURE COMPLETE PO LIQD
237.0000 mL | Freq: Two times a day (BID) | ORAL | Status: DC
  Administered 2013-07-26 – 2013-07-27 (×3): 237 mL via ORAL

## 2013-07-26 MED ORDER — ROCURONIUM BROMIDE 100 MG/10ML IV SOLN
INTRAVENOUS | Status: DC | PRN
  Administered 2013-07-26: 10 mg via INTRAVENOUS
  Administered 2013-07-26: 30 mg via INTRAVENOUS

## 2013-07-26 MED ORDER — ZOLPIDEM TARTRATE 5 MG PO TABS
5.0000 mg | ORAL_TABLET | Freq: Every evening | ORAL | Status: DC | PRN
  Administered 2013-07-27 (×2): 5 mg via ORAL
  Filled 2013-07-26 (×2): qty 1

## 2013-07-26 MED ORDER — MIDAZOLAM HCL 5 MG/5ML IJ SOLN
INTRAMUSCULAR | Status: DC | PRN
  Administered 2013-07-26: 2 mg via INTRAVENOUS

## 2013-07-26 MED ORDER — SODIUM CHLORIDE 0.9 % IJ SOLN
INTRAMUSCULAR | Status: DC | PRN
  Administered 2013-07-26: 10:00:00

## 2013-07-26 MED ORDER — FENTANYL CITRATE 0.05 MG/ML IJ SOLN
25.0000 ug | INTRAMUSCULAR | Status: DC | PRN
  Administered 2013-07-26: 50 ug via INTRAVENOUS
  Administered 2013-07-26 (×2): 25 ug via INTRAVENOUS
  Administered 2013-07-26: 50 ug via INTRAVENOUS

## 2013-07-26 MED ORDER — ONDANSETRON HCL 4 MG/2ML IJ SOLN
INTRAMUSCULAR | Status: DC | PRN
  Administered 2013-07-26: 4 mg via INTRAMUSCULAR

## 2013-07-26 MED ORDER — 0.9 % SODIUM CHLORIDE (POUR BTL) OPTIME
TOPICAL | Status: DC | PRN
  Administered 2013-07-26: 2000 mL

## 2013-07-26 MED ORDER — ONDANSETRON HCL 4 MG/2ML IJ SOLN
4.0000 mg | Freq: Four times a day (QID) | INTRAMUSCULAR | Status: DC | PRN
  Administered 2013-07-26 – 2013-07-27 (×2): 4 mg via INTRAVENOUS
  Filled 2013-07-26 (×2): qty 2

## 2013-07-26 MED ORDER — ESCITALOPRAM OXALATE 5 MG PO TABS
5.0000 mg | ORAL_TABLET | Freq: Every evening | ORAL | Status: DC
  Administered 2013-07-26 – 2013-07-28 (×3): 5 mg via ORAL
  Filled 2013-07-26 (×4): qty 1

## 2013-07-26 MED ORDER — ALPRAZOLAM 0.5 MG PO TABS
0.5000 mg | ORAL_TABLET | Freq: Three times a day (TID) | ORAL | Status: DC | PRN
  Administered 2013-07-26 – 2013-07-29 (×3): 0.5 mg via ORAL
  Filled 2013-07-26 (×3): qty 1

## 2013-07-26 MED ORDER — ONDANSETRON HCL 4 MG PO TABS
4.0000 mg | ORAL_TABLET | Freq: Four times a day (QID) | ORAL | Status: DC | PRN
  Administered 2013-07-27: 4 mg via ORAL
  Filled 2013-07-26 (×3): qty 1

## 2013-07-26 MED ORDER — PROMETHAZINE HCL 25 MG/ML IJ SOLN
INTRAMUSCULAR | Status: AC
  Administered 2013-07-26: 11:00:00
  Filled 2013-07-26: qty 1

## 2013-07-26 MED ORDER — HEPARIN SODIUM (PORCINE) 1000 UNIT/ML IJ SOLN
INTRAMUSCULAR | Status: AC
  Filled 2013-07-26: qty 1

## 2013-07-26 MED ORDER — LACTATED RINGERS IV SOLN
INTRAVENOUS | Status: DC | PRN
  Administered 2013-07-26 (×2): via INTRAVENOUS

## 2013-07-26 MED ORDER — DEXAMETHASONE SODIUM PHOSPHATE 10 MG/ML IJ SOLN
INTRAMUSCULAR | Status: DC | PRN
  Administered 2013-07-26: 10 mg via INTRAVENOUS

## 2013-07-26 MED ORDER — CEFAZOLIN SODIUM-DEXTROSE 2-3 GM-% IV SOLR
2.0000 g | INTRAVENOUS | Status: AC
  Administered 2013-07-26: 2 g via INTRAVENOUS

## 2013-07-26 MED ORDER — KCL IN DEXTROSE-NACL 20-5-0.45 MEQ/L-%-% IV SOLN
INTRAVENOUS | Status: DC
  Administered 2013-07-26: 13:00:00 via INTRAVENOUS
  Administered 2013-07-27: 50 mL/h via INTRAVENOUS
  Filled 2013-07-26 (×2): qty 1000

## 2013-07-26 MED ORDER — SODIUM CHLORIDE 0.9 % IJ SOLN
INTRAMUSCULAR | Status: AC
  Administered 2013-07-26: 11:00:00
  Filled 2013-07-26: qty 10

## 2013-07-26 MED ORDER — HYDROMORPHONE HCL PF 1 MG/ML IJ SOLN
0.5000 mg | INTRAMUSCULAR | Status: DC | PRN
  Administered 2013-07-26 (×2): 0.5 mg via INTRAVENOUS
  Filled 2013-07-26 (×2): qty 1

## 2013-07-26 MED ORDER — KETOROLAC TROMETHAMINE 30 MG/ML IJ SOLN
15.0000 mg | Freq: Four times a day (QID) | INTRAMUSCULAR | Status: AC
  Filled 2013-07-26: qty 1

## 2013-07-26 MED ORDER — NEOSTIGMINE METHYLSULFATE 1 MG/ML IJ SOLN
INTRAMUSCULAR | Status: DC | PRN
  Administered 2013-07-26: 4 mg via INTRAVENOUS

## 2013-07-26 MED ORDER — PROPOFOL 10 MG/ML IV BOLUS
INTRAVENOUS | Status: DC | PRN
  Administered 2013-07-26: 150 mg via INTRAVENOUS

## 2013-07-26 MED ORDER — BUPIVACAINE HCL (PF) 0.25 % IJ SOLN
INTRAMUSCULAR | Status: AC
  Filled 2013-07-26: qty 30

## 2013-07-26 MED ORDER — SUCCINYLCHOLINE CHLORIDE 20 MG/ML IJ SOLN
INTRAMUSCULAR | Status: DC | PRN
  Administered 2013-07-26: 100 mg via INTRAVENOUS

## 2013-07-26 MED ORDER — GLYCOPYRROLATE 0.2 MG/ML IJ SOLN
INTRAMUSCULAR | Status: DC | PRN
  Administered 2013-07-26: 0.6 mg via INTRAVENOUS

## 2013-07-26 SURGICAL SUPPLY — 42 items
APPLIER CLIP 11 MED OPEN (CLIP)
ATTRACTOMAT 16X20 MAGNETIC DRP (DRAPES) ×2 IMPLANT
BENZOIN TINCTURE PRP APPL 2/3 (GAUZE/BANDAGES/DRESSINGS) ×2 IMPLANT
BLADE EXTENDED COATED 6.5IN (ELECTRODE) ×2 IMPLANT
CANISTER SUCTION 2500CC (MISCELLANEOUS) ×2 IMPLANT
CHLORAPREP W/TINT 26ML (MISCELLANEOUS) ×2 IMPLANT
CLIP APPLIE 11 MED OPEN (CLIP) IMPLANT
CLOTH BEACON ORANGE TIMEOUT ST (SAFETY) ×2 IMPLANT
CONT SPEC 4OZ CLIKSEAL STRL BL (MISCELLANEOUS) IMPLANT
CONT SPECI 4OZ STER CLIK (MISCELLANEOUS) IMPLANT
COVER SURGICAL LIGHT HANDLE (MISCELLANEOUS) ×2 IMPLANT
DRAPE UTILITY 15X26 (DRAPE) ×2 IMPLANT
DRAPE WARM FLUID 44X44 (DRAPE) ×2 IMPLANT
DRSG TELFA 4X10 ISLAND STR (GAUZE/BANDAGES/DRESSINGS) ×2 IMPLANT
DRSG TELFA 4X14 ISLAND ADH (GAUZE/BANDAGES/DRESSINGS) ×2 IMPLANT
ELECT REM PT RETURN 9FT ADLT (ELECTROSURGICAL) ×2
ELECTRODE REM PT RTRN 9FT ADLT (ELECTROSURGICAL) ×1 IMPLANT
GAUZE SPONGE 4X4 16PLY XRAY LF (GAUZE/BANDAGES/DRESSINGS) ×2 IMPLANT
GLOVE BIO SURGEON STRL SZ7.5 (GLOVE) ×4 IMPLANT
GLOVE INDICATOR 8.0 STRL GRN (GLOVE) ×2 IMPLANT
GOWN STRL REIN XL XLG (GOWN DISPOSABLE) ×4 IMPLANT
KIT BASIN OR (CUSTOM PROCEDURE TRAY) ×2 IMPLANT
LIGASURE IMPACT 36 18CM CVD LR (INSTRUMENTS) ×2 IMPLANT
NS IRRIG 1000ML POUR BTL (IV SOLUTION) IMPLANT
PACK GENERAL/GYN (CUSTOM PROCEDURE TRAY) ×2 IMPLANT
SHEET LAVH (DRAPES) ×2 IMPLANT
SPONGE GAUZE 4X4 12PLY (GAUZE/BANDAGES/DRESSINGS) IMPLANT
SPONGE LAP 18X18 X RAY DECT (DISPOSABLE) ×2 IMPLANT
STRIP CLOSURE SKIN 1/2X4 (GAUZE/BANDAGES/DRESSINGS) ×2 IMPLANT
SUT MNCRL AB 4-0 PS2 18 (SUTURE) ×4 IMPLANT
SUT PDS AB 0 CTX 60 (SUTURE) ×4 IMPLANT
SUT SILK 0 (SUTURE)
SUT SILK 0 30XBRD TIE 6 (SUTURE) IMPLANT
SUT VIC AB 0 CT1 36 (SUTURE) ×8 IMPLANT
SUT VIC AB 2-0 SH 27 (SUTURE) ×3
SUT VIC AB 2-0 SH 27X BRD (SUTURE) ×3 IMPLANT
SUT VICRYL 0 TIES 12 18 (SUTURE) ×2 IMPLANT
SYR 20CC LL (SYRINGE) ×4 IMPLANT
TOWEL BLUE STERILE X RAY DET (MISCELLANEOUS) ×2 IMPLANT
TOWEL OR 17X26 10 PK STRL BLUE (TOWEL DISPOSABLE) ×2 IMPLANT
TOWEL OR NON WOVEN STRL DISP B (DISPOSABLE) ×2 IMPLANT
TRAY FOLEY CATH 14FRSI W/METER (CATHETERS) ×2 IMPLANT

## 2013-07-26 NOTE — Anesthesia Postprocedure Evaluation (Signed)
  Anesthesia Post-op Note  Patient: Deborah Macdonald  Procedure(s) Performed: Procedure(s) (LRB): EXPLORATORY LAPAROTOMY, TOTAL ABDOMINAL HYSTERECTOMY ,BILATERAL SALPINGO OOPHORECTOMY , OMENTECTOMY     (Bilateral)  Patient Location: PACU  Anesthesia Type: General  Level of Consciousness: awake and alert   Airway and Oxygen Therapy: Patient Spontanous Breathing  Post-op Pain: mild  Post-op Assessment: Post-op Vital signs reviewed, Patient's Cardiovascular Status Stable, Respiratory Function Stable, Patent Airway and No signs of Nausea or vomiting  Last Vitals:  Filed Vitals:   07/26/13 1245  BP: 147/74  Pulse: 73  Temp: 36.4 C  Resp: 18    Post-op Vital Signs: stable   Complications: No apparent anesthesia complications

## 2013-07-26 NOTE — Preoperative (Signed)
Beta Blockers   Reason not to administer Beta Blockers:Not Applicable 

## 2013-07-26 NOTE — H&P (View-Only) (Signed)
Pre-operative Note: Gyn-Onc  Deborah Macdonald 56 y.o. female  CC:  Chief Complaint  Patient presents with  . Pre-op Exam    Follow up     HPI:  Deborah Macdonald is a 57 year old, gravida 3 para 3, who was noted to have left cervical adenopathy in September 2013. She noted that it was a slowly increasing in size and the initial workup was for evaluation for lung disease. She underwent excision of supraclavicular LN on 01/21/2013 The malignant cells are positive for cytokeratin 7, estrogen receptor, progesterone receptor, and focally for p53. They are negative for cytokeratin 20, GCDFP, Napsin-A, , thyroglobulin, and TTF-1. This immunohistochemical profile raises the possibility of metastatic gynecologic carcinoma or metastatic breast carcinoma. The former is favored. 01/27/13 PET IMPRESSION: 1. 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 2. Bulky peritoneal metastasis in the upper abdomen and along the capsule of the liver. 3. Hypermetabolic retroperitoneal nodal metastasis. 4. Small hypermetabolic mediastinal nodal metastasis. 5. Supraclavicular hypermetabolic nodal metastasis.    She has recently completed six cycles of carbo/taxol, last on 06/16/13.  CT chest/abdomen/pelvis on 05/09/13 prior to completion of chemotherapy resulted:  1. Near complete resolution of the bulky the peritoneal implants. 2. Marked reduction in size of periaortic adenopathy with lymph nodes now less than 5 mm. 3. Marked reduction in the volume of the cystic and solid pelvic mass with now only a normal volume of right ovarian tissue measurable. 1. No evidence of disease progression in the thorax. 2. Interval decrease in size of mediastinal and supraclavicular lymphadenopathy.  CA 125 on 05/11/13 was 39.9.  At her last visit with Dr. [name] on 05/23/13, it was recommended that she undergo surgical intervention including an  exploratory laparotomy, total abdominal hysterectomy, bilateral salpingo-oophorectomy, omentectomy, possible debulking.      Interval History:  She presents today for pre-operative evaluation.  She reports doing well since her last visit with no complaints voiced.  She tolerated her sixth cycle of chemotherapy well.      Review of Systems  Constitutional: Feels well.  No fever, chills, early satiety, or unintentional weight loss or gain.  Cardiovascular: No chest pain, shortness of breath, or edema.  Pulmonary: No cough or wheeze.  Gastrointestinal: No nausea, vomiting, or diarrhea. No bright red blood per rectum or change in bowel movement.  Genitourinary: No frequency, urgency, or dysuria. No vaginal bleeding or discharge.  Musculoskeletal: No myalgia or joint pain. Neurologic: No weakness, numbness, or change in gait.  Psychology: No depression, anxiety, or insomnia.  Current Meds:  Outpatient Encounter Prescriptions as of 06/30/2013  Medication Sig Dispense Refill  . ALPRAZolam (XANAX) 0.5 MG tablet Take 1 tablet (0.5 mg total) by mouth 3 (three) times daily as needed.  60 tablet  0  . Aspirin-Salicylamide-Caffeine (BC HEADACHE POWDER PO) Take 1 packet by mouth every 6 (six) hours as needed (headache).      . cloNIDine (CATAPRES) 0.1 MG tablet Take 1 tablet (0.1 mg total) by mouth daily.  60 tablet  11  . escitalopram (LEXAPRO) 5 MG tablet Take 5 mg by mouth daily.      Marland Kitchen loratadine (CLARITIN) 10 MG tablet Take 10 mg by mouth daily.      Marland Kitchen triamterene-hydrochlorothiazide (MAXZIDE-25) 37.5-25 MG per tablet Take 1 tablet by mouth daily.      Marland Kitchen lidocaine-prilocaine (EMLA) cream Apply topically as needed. Apply to skin over [place] Cath site at  least one hour prior to needle stick  30 g  3  . ondansetron (ZOFRAN) 8 MG tablet Take 1 tablet (8 mg total) by mouth 2 (two) times daily. Twice daily starting the day after chemo for 3 days then as needed.  30 tablet  3  . prochlorperazine (COMPAZINE)  10 MG tablet Take 1 tablet (10 mg total) by mouth every 6 (six) hours as needed (Nausea or vomiting).  30 tablet  1  . zolpidem (AMBIEN) 5 MG tablet Take 1 tablet (5 mg total) by mouth at bedtime as needed for sleep.  30 tablet  3   No facility-administered encounter medications on file as of 06/30/2013.    Allergy:  Allergies  Allergen Reactions  . Codeine Nausea Only    Social Hx:   History   Social History  . Marital Status: Married    Spouse Name: N/A    Number of Children: 3  . Years of Education: N/A   Occupational History  . [employer]     Social History Main Topics  . Smoking status: Former Smoker -- 1.00 packs/day for 39 years    Quit date: 01/13/2013  . Smokeless tobacco: Never Used  . Alcohol Use: 1.0 oz/week    2 drink(s) per week  . Drug Use: No  . Sexual Activity: Not on file   Other Topics Concern  . Not on file   Social History Narrative  . No narrative on file    Past Surgical Hx:  Past Surgical History  Procedure Laterality Date  . Wisdom tooth extraction    . Lymph node biopsy Left 01/21/2013    Procedure: EXCISIONAL BIOPSY OF THE NECK;  Surgeon: Serena Colonel, MD;  Location: [facility] OR;  Service: ENT;  Laterality: Left;  . [place]      20 year ago    Past Medical Hx:  Past Medical History  Diagnosis Date  . PONV (postoperative nausea and vomiting)   . MVP (mitral valve prolapse)   . Hypertension   . Anxiety   . Shortness of breath     at times "anxiety"  . Ovarian cancer on right   . Regional lymph node metastasis present     Family Hx:  Family History  Problem Relation Age of Onset  . Heart attack Father   . Melanoma Maternal Uncle     dx in his 22s; mets to brain  . Dementia Maternal Grandmother   . COPD Maternal Grandfather   . Dementia Paternal Grandmother   . COPD Paternal Grandfather   . Testicular cancer Son 74    Vitals:  Blood pressure 136/82, pulse 80, temperature 97.7 F (36.5 C), temperature source Oral, resp. rate 16,  height 5\' 6"  (1.676 m), weight 139 lb 9.6 oz (63.322 kg).  Physical Exam:  General: Well developed, well nourished female in no acute distress. Alert and oriented x 3.  Cardiovascular: Regular rate and rhythm. S1 and S2 normal.  Lungs: Clear to auscultation bilaterally. No wheezes/crackles/rhonchi noted.  Skin: No rashes or lesions present. Back: No CVA tenderness.  Extremities: No bilateral cyanosis, edema, or clubbing.   Assessment/Plan:  Deborah Macdonald is a. 57 year old with stage IV B. ovarian carcinoma. The initial presentation was that of extra-abdominal lymphadenopathy, metastatic disease within the supraclavicular lymph nodes, and CA 125 elevated to 1202.4.  It was recommended at that time that she receive 3-4 cycles of dose dense neoadjuvant Taxol platinum therapy followed by repeat imaging after cycle 3 with  consideration for interval debulking if the extra-abdominal disease resolves. The patient has completed 6 cycles of carbo/taxol. Last CA 125 is 39.9.  She will be scheduled for an exploratory laparotomy, total abdominal hysterectomy, bilateral salpingo-oophorectomy, omentectomy, and possible debulking by Dr. Laurette Schimke on 07/26/13.  The risks of the procedure were discussed with the patient and husband and were inclusive of infection, bleeding, damage to surrounding structures, prolonged hospitalization, and reoperation. The patient and her husband's questions were answered to her satisfaction at this time.  The patient is aware that if residual disease is identified at the time of surgery additional cycles of chemotherapy will be recommended.  She would also like to meet with anesthesia prior to surgery due to a history of moderate post-operative nausea and vomiting.  She is advised to call for any questions or concerns.  The patient was reviewed with Dr. [name], who agrees with the above plan.   Tyshun Tuckerman DEAL, NP 06/30/2013, 5:11 PM

## 2013-07-26 NOTE — Op Note (Signed)
Preoperative Diagnosis: 1.Stage 4 ovarian cancer  Postoperative Diagnosis: Stage IV ovarian cancer  Procedure(s) Performed: 1. Exploratory laparotomy with total hysterectomy bilateral salpingo-oophorectomy, resection or peritoneal nodules omentectomy   Surgeon: [name].  [name], MD., PhD  Assistant Surgeon: Antionette Char, M.D.    Specimens: Uterus Cervix, Bilateral tubes / ovaries, peritoneal biopsies, and omentum.   Estimated Blood Loss: Less than 100 mL. Blood Replacement: None  Complications: None.   Operative Findings: Slightly enlarged right adnexa, 3 nodules noted in the posterior cul-de-sac. No evidence of intra-abdominal pelvic or peritoneal metastases.  No evidence of disease on  small and large bowel palpation. This represented an optimal cytoreduction with no gross visible disease remaining.   Procedure:   The patient was seen in the Holding Room. The risks, benefits, complications, treatment options, and expected outcomes were discussed with the patient.  The patient concurred with the proposed plan, giving informed consent.   The patient was  identified as Deborah Macdonald  and the procedure verified as hysterectomy, BSO with possible debulking. A Time Out was held and the above information confirmed upon entry to the operating room..  After induction of anesthesia, the patient was draped and prepped in the usual sterile manner.  She was prepped and draped in the normal sterile fashion in the dorsal lithotomy position in padded Allen stirrups with good attention paid to support of the lower back and lower extremities. Position was adjusted for appropriate support. A Foley catheter was placed to gravity.   A midline vertical incision was made and carried through the subcutaneous tissue to the fascia. The fascial incision was made and extended superiorally. The rectus muscles were separated. The peritoneum was identified and entered. Peritoneal incision was extended  longitudinally.  The abdominal cavity was entered sharply and without incident. A Bookwalter retractor was then placed. A survey of the abdomen and pelvis revealed the above findings.   After packing the small bowel into the upper abdomen, we began the procedure by entering the  pelvic sidewall just posterior to the right round ligament. The pararectal space was developed and the retroperitoneum developed up to the level of the common iliac artery.  The course of the ureter was identified with ease. The right IP was then skeletonized, clamped, cut and suture ligated with 0 Vicryl x 2.   Kelly clamps were placed on both uterine cornua. The round ligaments were identified and transected with the bovie. The anterior peritoneal reflection was incised and the bladder was dissected off the lower uterine segment. The retroperitoneal space was explored and the ureters were identified bilaterally.  The left ureter was identified in the retroperitoneum.  The left IP was clamped transected and ligated.  The uterine vessels were skeletonized bilaterally , then clamped, cut and suture ligated with 0-Vicryl suture. Serial pedicles of the cardinal and utero-sacral ligaments were clamped, cut, and suture ligated with 0-Vicryl. Entrance was made into the vagina and the cervix amputated. The vaginal cuff angle were suture ligated with 0-Vicryl suture. The vaginal cuff was then closed in the standard [facility] fashion with 0 PDS suture.   The pelvis was copiously irrigated. Hemostasis was observed.  Peritoneal nodules of the p posterior cul-de-sac were excised.  An infragastric omentectomy was performed using a Ligasure.  Hemostasis was assured.  The fascia was reapproximated with 0 looped PDS using a total of two sutures. The subcutaneous layer was then irrigated copiously.  The subcutaneous layer was infiltrated with Lexapro reapproximated with interrupted 2-0 Vicryl. The  subcutaneous layer was then reapproximated with a  running 4-0 Monocryl. The patient tolerated the procedure well.   Sponge, lap and needle counts were correct x 2.    The patient had sequential compression devices for VTE prophylaxis and will receive Lovenox postoperatively. The patient was extubated and taken to the recovery room in stable condition.

## 2013-07-26 NOTE — Transfer of Care (Signed)
Immediate Anesthesia Transfer of Care Note  Patient: Deborah Macdonald  Procedure(s) Performed: Procedure(s): EXPLORATORY LAPAROTOMY, TOTAL ABDOMINAL HYSTERECTOMY ,BILATERAL SALPINGO OOPHORECTOMY , OMENTECTOMY     (Bilateral)  Patient Location: PACU  Anesthesia Type:General  Level of Consciousness: awake, alert , oriented and patient cooperative  Airway & Oxygen Therapy: Patient Spontanous Breathing and Patient connected to face mask oxygen  Post-op Assessment: Report given to PACU RN and Post -op Vital signs reviewed and stable  Post vital signs: Reviewed and stable  Complications: No apparent anesthesia complications

## 2013-07-26 NOTE — Interval H&P Note (Signed)
History and Physical Interval Note:  07/26/2013 8:04 AM  Deborah Macdonald  has presented today for surgery, with the diagnosis of OVARIAN CANCER   The various methods of treatment have been discussed with the patient and family. After consideration of risks, benefits and other options for treatment, the patient has consented to  Procedure(s): EXPLORATORY LAPAROTOMY TOTAL ABDOMINAL HYSTERECTOMY BILATERAL SALPINGO OOPHORECTOMY  OMENTECTOMY POSSIBLE DEBULKING    (Bilateral) as a surgical intervention .  The patient's history has been reviewed, patient examined, no change in status, stable for surgery.  I have reviewed the patient's chart and labs.  Questions were answered to the patient's satisfaction.     [place], [facility]

## 2013-07-27 ENCOUNTER — Encounter (HOSPITAL_COMMUNITY): Payer: Self-pay | Admitting: Gynecologic Oncology

## 2013-07-27 LAB — CBC
MCH: 32.8 pg (ref 26.0–34.0)
Platelets: 233 10*3/uL (ref 150–400)
RBC: 3.6 MIL/uL — ABNORMAL LOW (ref 3.87–5.11)

## 2013-07-27 LAB — BASIC METABOLIC PANEL
Calcium: 9.5 mg/dL (ref 8.4–10.5)
Chloride: 98 mEq/L (ref 96–112)
GFR calc non Af Amer: >90 mL/min (ref >90)
Glucose, Bld: 136 mg/dL — ABNORMAL HIGH (ref 70–99)
Sodium: 137 mEq/L (ref 135–145)

## 2013-07-27 MED ORDER — SODIUM CHLORIDE 0.9 % IJ SOLN: 10.0000 mL | INTRAMUSCULAR | Status: DC | PRN

## 2013-07-27 MED ORDER — ONDANSETRON HCL 4 MG PO TABS
4.0000 mg | ORAL_TABLET | Freq: Once | ORAL | Status: AC
  Administered 2013-07-27: 4 mg via ORAL

## 2013-07-27 MED ORDER — HEPARIN SOD (PORK) LOCK FLUSH 100 UNIT/ML IV SOLN
500.0000 [IU] | INTRAVENOUS | Status: DC
  Filled 2013-07-27: qty 5

## 2013-07-27 MED ORDER — HEPARIN SOD (PORK) LOCK FLUSH 100 UNIT/ML IV SOLN
500.0000 [IU] | INTRAVENOUS | Status: DC | PRN
  Administered 2013-07-27: 500 [IU]
  Filled 2013-07-27: qty 5

## 2013-07-27 MED ORDER — SODIUM CHLORIDE 0.9 % IJ SOLN
10.0000 mL | Freq: Two times a day (BID) | INTRAMUSCULAR | Status: DC
  Administered 2013-07-28: 10 mL

## 2013-07-27 MED ORDER — BISACODYL 10 MG RE SUPP
10.0000 mg | Freq: Once | RECTAL | Status: AC
  Administered 2013-07-27: 10 mg via RECTAL
  Filled 2013-07-27: qty 1

## 2013-07-27 MED ORDER — KCL IN DEXTROSE-NACL 20-5-0.45 MEQ/L-%-% IV SOLN
INTRAVENOUS | Status: DC
  Administered 2013-07-27: 100 mL/h via INTRAVENOUS
  Administered 2013-07-28: 06:00:00 via INTRAVENOUS
  Filled 2013-07-27 (×3): qty 1000

## 2013-07-27 NOTE — Care Management Note (Signed)
    Page 1 of 1   07/27/2013     10:15:43 AM   CARE MANAGEMENT NOTE 07/27/2013  Patient:  Deborah Macdonald, Deborah Macdonald   Account Number:  [id number]  Date Initiated:  07/27/2013  Documentation initiated by:  Lorenda Ishihara  Subjective/Objective Assessment:   57 yo female admitted s/p exploratory lap, TAH, BSO, Omenectomy. PTA lived at home with spouse.     Action/Plan:   Home when stable   Anticipated DC Date:  07/27/2013   Anticipated DC Plan:  HOME/SELF CARE      DC Planning Services  CM consult      Choice offered to / List presented to:             Status of service:  Completed, signed off Medicare Important Message given?   (If response is "NO", the following Medicare IM given date fields will be blank) Date Medicare IM given:   Date Additional Medicare IM given:    Discharge Disposition:  HOME/SELF CARE  Per UR Regulation:  Reviewed for med. necessity/level of care/duration of stay  If discussed at Long Length of Stay Meetings, dates discussed:    Comments:

## 2013-07-27 NOTE — Progress Notes (Signed)
1 Day Post-Op Procedure(s) (LRB): EXPLORATORY LAPAROTOMY, TOTAL ABDOMINAL HYSTERECTOMY ,BILATERAL SALPINGO OOPHORECTOMY , OMENTECTOMY     (Bilateral)  Subjective: Patient reports doing well this am.  Tolerating solid food with no nausea or emesis.  Ambulating without difficulty.  Adequate pain relief reported.  "My stomach feels tight."  Denies chest pain, dyspnea, passing flatus, or having a bowel movement.  No concerns voiced.    Objective: Vital signs in last 24 hours: Temp:  [97.6 F (36.4 C)-99.5 F (37.5 C)] 98.6 F (37 C) (10/15 0555) Pulse Rate:  [60-93] 76 (10/15 0555) Resp:  [13-18] 18 (10/15 0555) BP: (133-169)/(54-84) 134/71 mmHg (10/15 0555) SpO2:  [96 %-100 %] 96 % (10/15 0555) Last BM Date: 07/25/13  Intake/Output from previous day: 10/14 0701 - 10/15 0700 In: 3475.8 [P.O.:720; I.V.:2755.8] Out: 3625 [Urine:3525; Blood:100]  Physical Examination: General: alert, cooperative and no distress Resp: clear to auscultation bilaterally Cardio: regular rate and rhythm, S1, S2 normal, no murmur, click, rub or gallop GI: incision: midline incision without erythema or drainage and abdomen mildly tympanic, active bowel sounds, mildly firm Extremities: extremities normal, atraumatic, no cyanosis or edema  Labs: WBC/Hgb/Hct/Plts:  13.8/11.8/34.8/233 (10/15 0404) BUN/Cr/glu/ALT/AST/amyl/lip:  10/0.56/--/--/--/--/-- (10/15 0404)  Assessment: 57 y.o. s/p Procedure(s): EXPLORATORY LAPAROTOMY, TOTAL ABDOMINAL HYSTERECTOMY ,BILATERAL SALPINGO OOPHORECTOMY , OMENTECTOMY    : stable Pain:  Pain is well-controlled on PRN medications.  Heme:  Hgb 11.8 and Hct 34.8 this am.  Stable post-operatively.  CV:  Hx hypertension and MVP.  Current treatment:  clonidine (Catapres).  GI:  Tolerating po: Yes     FEN:  Stable postoperatively.  Prophylaxis: intermittent pneumatic compression boots and lovenox 40 mg SQ daily.  Plan: Saline lock IV Advance diet as tolerated IV to flush port  during hospital stay Encourage ambulation, IS use, deep breathing, and coughing Continue post-operative plan of care per Dr. Tamela Oddi   LOS: 1 day    Wylie Coon DEAL 07/27/2013, 10:48 AM

## 2013-07-28 MED ORDER — ZOLPIDEM TARTRATE 10 MG PO TABS
10.0000 mg | ORAL_TABLET | Freq: Every evening | ORAL | Status: DC | PRN
  Administered 2013-07-28: 10 mg via ORAL
  Filled 2013-07-28: qty 1

## 2013-07-28 MED ORDER — SIMETHICONE 80 MG PO CHEW
80.0000 mg | CHEWABLE_TABLET | Freq: Four times a day (QID) | ORAL | Status: DC | PRN
  Filled 2013-07-28: qty 2

## 2013-07-28 MED ORDER — BISACODYL 10 MG RE SUPP
10.0000 mg | Freq: Once | RECTAL | Status: AC
  Administered 2013-07-28: 10 mg via RECTAL
  Filled 2013-07-28: qty 1

## 2013-07-28 MED ORDER — IBUPROFEN 600 MG PO TABS
600.0000 mg | ORAL_TABLET | Freq: Four times a day (QID) | ORAL | Status: DC
  Administered 2013-07-28 – 2013-07-29 (×5): 600 mg via ORAL
  Filled 2013-07-28 (×8): qty 1

## 2013-07-28 MED ORDER — SIMETHICONE 80 MG PO CHEW
160.0000 mg | CHEWABLE_TABLET | Freq: Once | ORAL | Status: AC
  Administered 2013-07-28: 160 mg via ORAL
  Filled 2013-07-28: qty 2

## 2013-07-28 MED ORDER — ZOLPIDEM TARTRATE 5 MG PO TABS: 5.0000 mg | ORAL_TABLET | Freq: Every evening | ORAL | Status: DC | PRN

## 2013-07-28 MED ORDER — TRAMADOL HCL 50 MG PO TABS: 50.0000 mg | ORAL_TABLET | Freq: Four times a day (QID) | ORAL | Status: DC | PRN

## 2013-07-28 NOTE — Progress Notes (Signed)
2 Days Post-Op Procedure(s) (LRB): EXPLORATORY LAPAROTOMY, TOTAL ABDOMINAL HYSTERECTOMY ,BILATERAL SALPINGO OOPHORECTOMY , OMENTECTOMY     (Bilateral)  Subjective: Patient reports tolerating clear liquids this am.  She reports having a bowel movement and an episode of flatus yesterday evening after suppository administration followed by an episode of emesis.  Ambulating without difficulty.  Adequate pain relief reported.  Denies chest pain, dyspnea, passing flatus this am, or having a bowel movement this am.  She does not want to take oxycodone, stating "it made my daughter sick and I am sure it will make me sick too."  She states that she would like to go home.  Her abdomen still remains tight and uncomfortable at times.    Objective: Vital signs in last 24 hours: Temp:  [98.1 F (36.7 C)-99.6 F (37.6 C)] 99.6 F (37.6 C) (10/16 0603) Pulse Rate:  [73-87] 76 (10/16 0603) Resp:  [18-20] 20 (10/16 0603) BP: (130-155)/(70-86) 136/81 mmHg (10/16 0603) SpO2:  [95 %-99 %] 95 % (10/16 0603) Last BM Date: 07/28/13  Intake/Output from previous day: 10/15 0701 - 10/16 0700 In: 2163.3 [P.O.:840; I.V.:1323.3] Out: 2100 [Urine:2100]  Physical Examination: General: alert, cooperative and no distress Resp: clear to auscultation bilaterally Cardio: regular rate and rhythm, S1, S2 normal, no murmur, click, rub or gallop GI: incision: midline incision with steri strips without erythema or drainage and abdomen tympanic, active bowel sounds, mildly firm, non-tender Extremities: extremities normal, atraumatic, no cyanosis or edema  Assessment: 57 y.o. s/p Procedure(s): EXPLORATORY LAPAROTOMY, TOTAL ABDOMINAL HYSTERECTOMY ,BILATERAL SALPINGO OOPHORECTOMY , OMENTECTOMY: stable Pain:  Pain is well-controlled on PRN medications.  Heme:  Hgb 11.8 and Hct 34.8 07/27/13 am.  Stable post-operatively.  CV:  Hx hypertension and MVP.  Current treatment:  clonidine (Catapres).  GI:  Tolerating po: Yes, clear  liquids.  Episode of emesis yesterday evening.     FEN:  Stable postoperatively.  Prophylaxis: intermittent pneumatic compression boots and lovenox 40 mg SQ daily.  Plan: Dulcolax suppository this am per pt request Encourage ambulation, IS use, deep breathing, and coughing Continue post-operative plan of care per Dr. Tamela Oddi   LOS: 2 days    CROSS, MELISSA DEAL 07/28/2013, 9:10 AM

## 2013-07-29 ENCOUNTER — Encounter: Payer: Self-pay | Admitting: Gynecologic Oncology

## 2013-07-29 MED ORDER — ENOXAPARIN SODIUM 40 MG/0.4ML ~~LOC~~ SOLN: 40.0000 mg | SUBCUTANEOUS | Status: DC

## 2013-07-29 MED ORDER — TRAMADOL HCL 50 MG PO TABS: 50.0000 mg | ORAL_TABLET | Freq: Four times a day (QID) | ORAL | Status: DC | PRN

## 2013-07-29 MED ORDER — ENOXAPARIN (LOVENOX) PATIENT EDUCATION KIT
PACK | Freq: Once | Status: AC
  Administered 2013-07-29: 09:00:00
  Filled 2013-07-29: qty 1

## 2013-07-29 NOTE — Progress Notes (Signed)
Pt discharged home via family; Pt and family given and explained all discharge instructions, carenotes, and prescriptions; pt and family stated understanding and denied questions/concerns; all f/u appointments in place; IV removed without complicaitons; pt stable at time of discharge  

## 2013-07-29 NOTE — Progress Notes (Signed)
Pt and husband given detailed instructions and demonstration on Lovenox administration. Both patient and husband were comfortable and able to teach back education provided. Husband is going to administer the shot as demostrated by him. Will continue to answer questions.

## 2013-07-29 NOTE — Discharge Summary (Signed)
Physician Discharge Summary  Patient ID: Tereka Thorley Bodner MRN: [id number] DOB/AGE: July 09, 1956 57 y.o.  Admit date: 07/26/2013 Discharge date: 07/29/2013  Admission Diagnoses: Ovarian Cancer  Discharge Diagnoses:  Ovarian Cancer  Discharged Condition:  The patient is in good condition and stable for discharge.    Hospital Course: On 07/26/2013, the patient underwent the following: Procedure(s):  EXPLORATORY LAPAROTOMY, TOTAL ABDOMINAL HYSTERECTOMY ,BILATERAL SALPINGO OOPHORECTOMY , OMENTECTOMY.  The postoperative course was uneventful.  She was discharged to home on postoperative day 3 tolerating a regular diet, passing flatus, and having bowel movements.  Final pathology discussed.  Consults: None  Significant Diagnostic Studies: None  Treatments: surgery: see above  Discharge Exam: Blood pressure 112/67, pulse 77, temperature 98.6 F (37 C), temperature source Oral, resp. rate 20, height 5\' 6"  (1.676 m), weight 144 lb (65.318 kg), SpO2 95.00%. General appearance: alert, cooperative and no distress Resp: clear to auscultation bilaterally Cardio: regular rate and rhythm, S1, S2 normal, no murmur, click, rub or gallop GI: soft, non-tender; bowel sounds normal; no masses,  no organomegaly Extremities: extremities normal, atraumatic, no cyanosis or edema Incision/Wound: Midline incision with steri strips clean, dry, and intact Port to the left chest without erythema or drainage  Disposition: 01-Home or Self Care  Discharge Orders   Future Appointments Provider Department Dept Phone   08/16/2013 1:30 PM Laurette Schimke, MD [facility] GYNECOLOGICAL [facility]COL[facility] [phone]   Future Orders Complete By Expires   Call MD for:  difficulty breathing, headache or visual disturbances  As directed    Call MD for:  extreme fatigue  As directed    Call MD for:  hives  As directed    Call MD for:  persistant dizziness or light-headedness  As directed    Call MD for:  persistant  nausea and vomiting  As directed    Call MD for:  redness, tenderness, or signs of infection (pain, swelling, redness, odor or green/yellow discharge around incision site)  As directed    Call MD for:  severe uncontrolled pain  As directed    Call MD for:  temperature >100.4  As directed    Diet - low sodium heart healthy  As directed    Driving Restrictions  As directed    Comments:     No driving for 2 weeks.  Do not take narcotics and drive.   Increase activity slowly  As directed    Lifting restrictions  As directed    Comments:     No lifting greater than 10 lbs.   Sexual Activity Restrictions  As directed    Comments:     No sexual activity, nothing in the vagina, for 6 weeks.       Medication List         ALPRAZolam 0.5 MG tablet  Commonly known as:  XANAX  Take 0.5 mg by mouth 3 (three) times daily as needed for sleep or anxiety.     BC HEADACHE POWDER PO  Take 1 packet by mouth every 6 (six) hours as needed (headache).     cloNIDine 0.1 MG tablet  Commonly known as:  CATAPRES  Take 0.1 mg by mouth at bedtime.     enoxaparin 40 MG/0.4ML injection  Commonly known as:  LOVENOX  Inject 0.4 mLs (40 mg total) into the skin daily.     escitalopram 5 MG tablet  Commonly known as:  LEXAPRO  Take 5 mg by mouth every evening.     lidocaine-prilocaine cream  Commonly known as:  EMLA  Apply topically as needed. Apply to skin over Porta Cath site at least one hour prior to needle stick     loratadine 10 MG tablet  Commonly known as:  CLARITIN  Take 10 mg by mouth daily.     ondansetron 8 MG tablet  Commonly known as:  ZOFRAN  Take 8 mg by mouth every 6 (six) hours as needed for nausea.     prochlorperazine 10 MG tablet  Commonly known as:  COMPAZINE  Take 1 tablet (10 mg total) by mouth every 6 (six) hours as needed (Nausea or vomiting).     traMADol 50 MG tablet  Commonly known as:  ULTRAM  Take 1 tablet (50 mg total) by mouth every 6 (six) hours as needed  (moderate to severe pain).     zolpidem 5 MG tablet  Commonly known as:  AMBIEN  Take 1 tablet (5 mg total) by mouth at bedtime as needed for sleep.           Follow-up Information   Follow up with Laurette Schimke, MD On 08/16/2013. (at 1:30pm)    Specialty:  Obstetrics and Gynecology   Contact information:   [address] [place] [address] [phone]       Greater than thirty minutes were spend for face to face discharge instructions and discharge orders/summary in EPIC.   Signed: CROSS, MELISSA DEAL 07/29/2013, 1:23 PM

## 2013-08-02 ENCOUNTER — Telehealth: Payer: Self-pay | Admitting: Gynecologic Oncology

## 2013-08-02 NOTE — Telephone Encounter (Signed)
Post op telephone call to check patient status.  Patient describes expected post operative status.  Adequate PO intake reported.  Bowels and bladder functioning without difficulty.  Pain minimal.  Reportable signs and symptoms reviewed.  Follow up appt already arranged for 08/16/13.  Instructed to call for any needs or concerns.

## 2013-08-16 ENCOUNTER — Ambulatory Visit: Payer: Commercial Managed Care - PPO | Attending: Gynecologic Oncology | Admitting: Gynecologic Oncology

## 2013-08-16 ENCOUNTER — Encounter: Payer: Self-pay | Admitting: Gynecologic Oncology

## 2013-08-16 VITALS — BP 145/78 | HR 98 | Temp 98.4°F | Resp 16 | Ht 66.0 in | Wt 143.2 lb

## 2013-08-16 DIAGNOSIS — Z90710 Acquired absence of both cervix and uterus: Secondary | ICD-10-CM | POA: Insufficient documentation

## 2013-08-16 DIAGNOSIS — R599 Enlarged lymph nodes, unspecified: Secondary | ICD-10-CM | POA: Insufficient documentation

## 2013-08-16 DIAGNOSIS — C569 Malignant neoplasm of unspecified ovary: Secondary | ICD-10-CM

## 2013-08-16 DIAGNOSIS — Z79899 Other long term (current) drug therapy: Secondary | ICD-10-CM | POA: Insufficient documentation

## 2013-08-16 DIAGNOSIS — Z9079 Acquired absence of other genital organ(s): Secondary | ICD-10-CM | POA: Insufficient documentation

## 2013-08-16 NOTE — Patient Instructions (Signed)
F/U with Dr Darrold Span for 3 more cycles dose dense therapy.  F/U in 3 months    Thank you very much Ms. Deborah Macdonald for allowing me to provide care for you today.  I appreciate your confidence in choosing our Gynecologic Oncology team.  If you have any questions about your visit today please call our office and we will get back to you as soon as possible.  [name]. [name] MD., PhD Gynecologic Oncology '

## 2013-08-16 NOTE — Progress Notes (Signed)
Post- operative Note: Gyn-Onc  Deborah Macdonald 57 y.o. female  CC:  Chief Complaint  Patient presents with  . Ovarian Cancer    Follow up    Assessment/Plan:  Deborah Macdonald is a. 57 year old with stage IV B. ovarian carcinoma. The initial presentation was that of extra-abdominal lymphadenopathy, metastatic disease within the supraclavicular lymph nodes, and CA 125 elevated to 1202.4. She received 6 cycles of dose dense neoadjuvant Taxol platinum therapy.  On 07/27/2003 she she underwent total abdominal hysterectomy bilateral salpingo-oophorectomy and omentectomy with excision of peritoneal metastases. No gross residual disease was present at the completion of surgery. Final pathology is notable for serous carcinoma within the peritoneal nodule and the left ovary and uterine serosa. 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   I've recommended that [name] receive an additional 3 cycles of dose dense Taxol carboplatin therapy.  No consolidation trials are available at this time and so I discussed with her that after completion of chemotherapy we will obtain a CT scan of the abdomen and pelvis to establish a new baseline and recommend additional tests based on symptomatology.  Followup with GYN oncology in 3 months Followup with Dr. Darrold Span on 08/17/2013  HPI:  Deborah Macdonald is a 57 y.o.  gravida 3 para 3, who was noted to have left cervical adenopathy in September 2013. She noted that it was a slowly increasing in size and the initial workup was for evaluation for lung disease. She underwent excision of supraclavicular LN on 01/21/2013 The malignant cells are positive for cytokeratin 7, estrogen receptor, progesterone receptor, and focally for p53. They are negative for cytokeratin 20, GCDFP, Napsin-A, , thyroglobulin, and TTF-1. This immunohistochemical profile raises the possibility of metastatic gynecologic carcinoma or metastatic breast carcinoma.  The former is favored. 01/27/13 PET IMPRESSION: 1. 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 2. Bulky peritoneal metastasis in the upper abdomen and along the capsule of the liver. 3. Hypermetabolic retroperitoneal nodal metastasis. 4. Small hypermetabolic mediastinal nodal metastasis. 5. Supraclavicular hypermetabolic nodal metastasis.    She completed six cycles of carbo/taxol, last on 06/16/13.  CT chest/abdomen/pelvis on 05/09/13 prior to completion of chemotherapy noted  1. Near complete resolution of the bulky the peritoneal implants. 2. Marked reduction in size of periaortic adenopathy with lymph nodes now less than 5 mm. 3. Marked reduction in the volume of the cystic and solid pelvic mass with now only a normal volume of right ovarian tissue measurable. 1. No evidence of disease progression in the thorax. 2. Interval decrease in size of mediastinal and supraclavicular lymphadenopathy.  CA 125 on 05/11/13 was 39.9.  On 07/26/2013 she underwent exploratory laparotomy total abdominal hysterectomy bilateral salpingo-oophorectomy omentectomy with and peritoneal excision.  Gross disease was notable only within the cul-de-sac and this was removed. Final pathology was consistent with serous carcinoma with associated fibrosis and psammoma bodies in the soft tissue biopsies of the peritoneum. Residual serous carcinoma was noted on the left ovary and the uterine serosa. Psammoma bodies were reported on the right ovary and bilateral fallopian tubes and also within the omentum.  Postoperatively she has done well. She presents today to discuss the treatment plan.  Review of Systems  Constitutional: Feels well.  No fever, chills, early satiety, or unintentional weight loss or gain.  Cardiovascular: No chest pain, shortness of breath, or edema.  Pulmonary: No cough or  wheeze.  Gastrointestinal: No  nausea, vomiting, or diarrhea. No bright red blood per rectum or change in bowel movement.  Genitourinary: No frequency, urgency, or dysuria. No vaginal bleeding or discharge.  Musculoskeletal: No myalgia or joint pain. Neurologic: No weakness, numbness, or change in gait.  Psychology: No depression, anxiety, or insomnia.  Current Meds:  Outpatient Encounter Prescriptions as of 08/16/2013  Medication Sig  . ALPRAZolam (XANAX) 0.5 MG tablet Take 0.5 mg by mouth 3 (three) times daily as needed for sleep or anxiety.  . Aspirin-Salicylamide-Caffeine (BC HEADACHE POWDER PO) Take 1 packet by mouth every 6 (six) hours as needed (headache).  . cloNIDine (CATAPRES) 0.1 MG tablet Take 0.1 mg by mouth at bedtime.  . enoxaparin (LOVENOX) 40 MG/0.4ML injection Inject 0.4 mLs (40 mg total) into the skin daily.  Marland Kitchen escitalopram (LEXAPRO) 5 MG tablet Take 5 mg by mouth every evening.   . lidocaine-prilocaine (EMLA) cream Apply topically as needed. Apply to skin over Porta Cath site at least one hour prior to needle stick  . loratadine (CLARITIN) 10 MG tablet Take 10 mg by mouth daily.  . ondansetron (ZOFRAN) 8 MG tablet Take 8 mg by mouth every 6 (six) hours as needed for nausea.  . prochlorperazine (COMPAZINE) 10 MG tablet Take 1 tablet (10 mg total) by mouth every 6 (six) hours as needed (Nausea or vomiting).  . zolpidem (AMBIEN) 5 MG tablet Take 1 tablet (5 mg total) by mouth at bedtime as needed for sleep.  . [DISCONTINUED] traMADol (ULTRAM) 50 MG tablet Take 1 tablet (50 mg total) by mouth every 6 (six) hours as needed (moderate to severe pain).    Allergy:  Allergies  Allergen Reactions  . Codeine Nausea Only    Social Hx:   History   Social History  . Marital Status: Married    Spouse Name: N/A    Number of Children: 3  . Years of Education: N/A   Occupational History  . [employer]     Social History Main Topics  . Smoking status: Former Smoker -- 1.00 packs/day for 39 years    Quit  date: 01/13/2013  . Smokeless tobacco: Never Used  . Alcohol Use: 1.0 oz/week    2 drink(s) per week  . Drug Use: No  . Sexual Activity: Not on file   Other Topics Concern  . Not on file   Social History Narrative  . No narrative on file    Past Surgical Hx:  Past Surgical History  Procedure Laterality Date  . Wisdom tooth extraction    . Lymph node biopsy Left 01/21/2013    Procedure: EXCISIONAL BIOPSY OF THE NECK;  Surgeon: Serena Colonel, MD;  Location: [facility] OR;  Service: ENT;  Laterality: Left;  . Leep      20 year ago  . Vascular access device insertion Right     port a cath   . Abdominal hysterectomy Bilateral 07/26/2013    Procedure: EXPLORATORY LAPAROTOMY, TOTAL ABDOMINAL HYSTERECTOMY ,BILATERAL SALPINGO OOPHORECTOMY , OMENTECTOMY    ;  Surgeon: Laurette Schimke, MD;  Location: [facility] ORS;  Service: Gynecology;  Laterality: Bilateral;    Past Medical Hx:  Past Medical History  Diagnosis Date  . PONV (postoperative nausea and vomiting)   . MVP (mitral valve prolapse)   . Hypertension   . Anxiety   . Shortness of breath     at times "anxiety"  . Ovarian cancer on right   . Regional lymph node metastasis present   .  Mental disorder     Family Hx:  Family History  Problem Relation Age of Onset  . Heart attack Father   . Melanoma Maternal Uncle     dx in his 36s; mets to brain  . Dementia Maternal Grandmother   . COPD Maternal Grandfather   . Dementia Paternal Grandmother   . COPD Paternal Grandfather   . Testicular cancer Son 88    Vitals:  Blood pressure 145/78, pulse 98, temperature 98.4 F (36.9 C), temperature source Oral, resp. rate 16, height 5\' 6"  (1.676 m), weight 143 lb 3.2 oz (64.955 kg).  Physical Exam:  General: Well developed, well nourished female in no acute distress. Alert and oriented x 3.  Cardiovascular: Regular rate and rhythm. S1 and S2 normal.  Lungs: Clear to auscultation bilaterally. No wheezes/crackles/rhonchi noted.  Skin: No rashes or  lesions present.  The abdomen consistent with Lovenox injections Pelvic: Normal external genitalia Bartholin's urethra Skene's gland.  The vagina is atrophic no discharge or bleeding vaginal cuff is intact no cul-de-sac masses Back: No CVA tenderness.  Extremities: No bilateral cyanosis, edema, or clubbing.   Laurette Schimke,  08/16/2013, 2:08 PM

## 2013-08-17 ENCOUNTER — Telehealth: Payer: Self-pay | Admitting: *Deleted

## 2013-08-17 ENCOUNTER — Ambulatory Visit: Payer: Commercial Managed Care - PPO | Admitting: Lab

## 2013-08-17 ENCOUNTER — Ambulatory Visit (HOSPITAL_BASED_OUTPATIENT_CLINIC_OR_DEPARTMENT_OTHER): Payer: Commercial Managed Care - PPO | Admitting: Oncology

## 2013-08-17 ENCOUNTER — Encounter: Payer: Self-pay | Admitting: Oncology

## 2013-08-17 ENCOUNTER — Other Ambulatory Visit: Payer: Self-pay | Admitting: Oncology

## 2013-08-17 VITALS — BP 144/80 | HR 73 | Temp 98.5°F | Resp 20 | Ht 66.0 in | Wt 143.1 lb

## 2013-08-17 DIAGNOSIS — C569 Malignant neoplasm of unspecified ovary: Secondary | ICD-10-CM

## 2013-08-17 DIAGNOSIS — C561 Malignant neoplasm of right ovary: Secondary | ICD-10-CM

## 2013-08-17 LAB — CBC WITH DIFFERENTIAL/PLATELET
Basophils Absolute: 0.1 10*3/uL (ref 0.0–0.1)
EOS%: 3.8 % (ref 0.0–7.0)
HCT: 37.1 % (ref 34.8–46.6)
HGB: 12.3 g/dL (ref 11.6–15.9)
MCH: 32.1 pg (ref 25.1–34.0)
MCV: 96.8 fL (ref 79.5–101.0)
NEUT#: 3.2 10*3/uL (ref 1.5–6.5)
NEUT%: 54.3 % (ref 38.4–76.8)
WBC: 5.9 10*3/uL (ref 3.9–10.3)
lymph#: 1.8 10*3/uL (ref 0.9–3.3)

## 2013-08-17 LAB — COMPREHENSIVE METABOLIC PANEL (CC13)
ALT: 17 U/L (ref 0–55)
AST: 16 U/L (ref 5–34)
Calcium: 10.3 mg/dL (ref 8.4–10.4)
Creatinine: 0.7 mg/dL (ref 0.6–1.1)
Total Bilirubin: <0.2 mg/dL (ref 0.20–1.20)

## 2013-08-17 NOTE — Progress Notes (Signed)
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 Surgery was TAH BSO omentectomy and resection of peritoneal nodules, with no gross residual disease in abdomen or pelvis at completion of procedure. Last imaging was CT CAP 09-09-13; she has not had PET. She has PAC in, this flushed during hospitalization with recent surgery. She has ~ 8 days remaining of popst op lovenox. Patient tells me that she is feeling "back to my usual self", with no pain, no constipation, no peripheral neuropathy, no bleeding.   ONCOLOGIC HISTORY Patient has seen Dr Leonette Most Lomax/ NP Eve regularly for years and was up to date on gyn exams at the time of this diagnosis. She presented with several months of slowly enlarging left supraclavicular adenopathy, measuring ~ 2 cm at time of biopsy by Dr Joline Maxcy 01-22-12. Pathology [phone]) showed metastatic adenocarcinoma with immunohistochemical profile consistent with gyn vs breast primary, including ER PR +. CA 125 was 1202. Breast imaging 01-28-13 at [facility] (bilateral diagnostic mammograms and [place]) had no findings of concern. PET/CT 02-03-13 in [facility] system found bilateral hypermetabolic supraclavicular and mediastinal adenopathy; 8.7 x 8.7 hypermetabolic cystic and solid pelvic mass with adjacent similar mass in right iliac fossa; multiple hypermetabolic peritoneal implants including area 5.5 x 3.5 cm at liver margin; no pulmonary or bony lesions. She was seen by Dr Laurette Schimke, with recommendation for dose dense taxol carboplatin, this given x 6 cycles from 02-09-2013 thru 06-16-2013, tolerated generally well without gCSF or any dose  reductions. CT CAP 05-09-13 after 4 cycles of chemotherapy found radiographic resolution of supraclavicular adenopathy, no findings of concern in chest, near complete resolution of bulky peritoneal implants and marked improvement in abdominal and pelvic mass. She had two additional cycles of chemotherapy thru 06-16-2013. She is scheduled for surgery by Dr [name] on 07-26-13.   Review of systems as above, also: Appetite "too good". No bladder symptoms. No LE swelling. No fever or symptoms of infection. No respiratory symptoms. Sleeping well. She has not tried driving and is not doing heavy lifting since surgery. Remainder of 10 point Review of Systems negative.  Objective:  Vital signs in last 24 hours:  BP 144/80  Pulse 73  Temp(Src) 98.5 F (36.9 C) (Oral)  Resp 20  Ht 5\' 6"  (1.676 m)  Wt 143 lb 1.6 oz (64.91 kg)  BMI 23.11 kg/m2 weight is up 5 lbs.  Alert, oriented and appropriate. Ambulatory without difficulty.  She looks entirely comfortable and is extremely pleasant as always  HEENT:PERRL, sclerae not icteric. Oral mucosa moist without lesions, posterior pharynx minimal bilateral erythema consistent with post nasal drainage  Neck supple. No JVD.  Lymphatics:no cervical,suraclavicular, axillary or inguinal adenopathy Resp: clear to auscultation bilaterally and normal percussion bilaterally Cardio: regular rate and rhythm. No gallop. GI: soft, nontender, not distended, no mass or organomegaly. Normally active bowel sounds. Surgical incision closed, no erythema or tenderness. Musculoskeletal/ Extremities: without pitting edema, cords, tenderness. Back not tender Neuro: no peripheral neuropathy. Otherwise nonfocal Skin without rash or petechiae, does have ecchymoses bilateral abdomen from lovenox injections Portacath-without erythema or tenderness  Lab Results:  CBC today with WBC 5.9, ANC 3.2, Hgb 12.3 and plt 259  CMET available after visit normal with exception of K+  5.2  Studies/Results:  Surgical Pathology Accession: [id number] Received: 07/26/2013 Laurette Schimke, MDAL PATHOLOGYNAL DIAGNOSIS Diagnosis 1. Soft tissue, biopsy, peritoneal nodule - SEROUS CARCINOMA WITH ASSOCIATED FIBROSIS AND PSAMMOMA BODIES(0.3 CM). 2. Uterus +/- tubes/ovaries, neoplastic - LEFT OVARY: MICROSCOPIC FOCI OF RESIDUAL SEROUS CARCINOMA WITH ASSOCIATED PSAMMOMA BODIES; BACKGROUND OVARIAN TISSUE WITH EXTENSIVE STROMAL FIBROSIS AND ENDOSALPINGIOSIS. - UTERINE SEROSA: MICROSCOPIC FOCUS OF RESIDUAL SEROUS CARCINOMA AND BACKGROUND EXTENSIVE FIBROSIS AND ASSOCIATED PSAMMOMA BODIES. - ENDOMETRIUM: BENIGN ATROPHIC ENDOMETRIUM, NO ATYPIA, HYPERPLASIA OR MALIGNANCY. - MYOMETRIUM: LEIOMYOMA AND ADENOMYOSIS. - CERVIX: BENIGN SQUAMOUS MUCOSA AND ENDOCERVICAL MUCOSA, NO DYSPLASIA OR MALIGNANCY. - RIGHT OVARY AND BILATERAL FALLOPIAN TUBES: NO EVIDENCE OF TUMOR WITH STROMAL FIBROSIS AND ASSOCIATED PSAMMOMA BODIES. 3. Omentum, resection for tumor - MATURE ADIPOSE TISSUE WITH FIBROSIS AND ASSOCIATED PSAMMOMA BODIES, NO EVIDENCE OF MALIGNANCY.   CHEST 2 VIEW  07-20-13 COMPARISON: 01/20/2013  FINDINGS:  Right jugular Port-A-Cath, tip projecting over mid SVC.  Normal heart size, mediastinal contours, and pulmonary vascularity.  Atherosclerotic calcification aortic arch.  Lungs slightly hyperinflated but clear.  No pleural effusion or pneumothorax.  Bones demineralized.  IMPRESSION:  Solid hyperinflated lungs.  Otherwise normal exam.    Medications: I have reviewed the patient's current medications. She did not use oral premedication decadron with previous chemotherapy, recalls that she "crashed" on day 3 from IV steroids day of treatment. Will refill Lexapro begun by Dr Gaylyn Rong.  She has had flu vaccine  DISCUSSION: patient is in agreement with another 3 cycles of the same carbo taxol, beginning 08-24-13. I will see her again before day 15 of this initial cycle, and she knows to  call if any needs prior.  Assessment/Plan: 1.IVB adenocarcinoma apparently left ovarian primary: left supraclavicular node + by biopsy at presentation 01-2013. Excellent response clinically to 6 cycles of dose dense taxol carboplatin, followed by TAH/ BSO/omentectomy/ resection of peritoneal nodules 07-26-13, now for 3 additional cycles of same chemotherapy beginning 08-24-13. 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 If she has screening mammograms next, should consider 3D/ tomo    Patient is in agreement with plan above. Chemotherapy orders entered.    Treanna Dumler P, MD   08/17/2013, 1:14 PM

## 2013-08-17 NOTE — Telephone Encounter (Signed)
appts made and printed. Pt is aware that tx will be added. i emailed MW to add the tx's...td 

## 2013-08-17 NOTE — Patient Instructions (Signed)
Call if needed at any time   [phone]

## 2013-08-17 NOTE — Telephone Encounter (Signed)
Per staff message and POF I have scheduled appts. Advised scheduler that lab appts needs to move [name]

## 2013-08-18 ENCOUNTER — Telehealth: Payer: Self-pay | Admitting: *Deleted

## 2013-08-18 NOTE — Telephone Encounter (Signed)
sw pt informed her that he labs for 11/12 and 11/19 times had changed. gv times for 11am and 1:45pm...td

## 2013-08-22 ENCOUNTER — Other Ambulatory Visit: Payer: Self-pay | Admitting: Oncology

## 2013-08-24 ENCOUNTER — Ambulatory Visit (HOSPITAL_BASED_OUTPATIENT_CLINIC_OR_DEPARTMENT_OTHER): Payer: Commercial Managed Care - PPO

## 2013-08-24 ENCOUNTER — Other Ambulatory Visit: Payer: Self-pay

## 2013-08-24 ENCOUNTER — Other Ambulatory Visit: Payer: Commercial Managed Care - PPO | Admitting: Lab

## 2013-08-24 ENCOUNTER — Other Ambulatory Visit (HOSPITAL_BASED_OUTPATIENT_CLINIC_OR_DEPARTMENT_OTHER): Payer: Commercial Managed Care - PPO | Admitting: Lab

## 2013-08-24 VITALS — BP 144/84 | HR 75 | Temp 98.6°F | Resp 18

## 2013-08-24 DIAGNOSIS — C561 Malignant neoplasm of right ovary: Secondary | ICD-10-CM

## 2013-08-24 DIAGNOSIS — C779 Secondary and unspecified malignant neoplasm of lymph node, unspecified: Secondary | ICD-10-CM

## 2013-08-24 DIAGNOSIS — C569 Malignant neoplasm of unspecified ovary: Secondary | ICD-10-CM

## 2013-08-24 DIAGNOSIS — Z5111 Encounter for antineoplastic chemotherapy: Secondary | ICD-10-CM

## 2013-08-24 LAB — CBC WITH DIFFERENTIAL/PLATELET
Basophils Absolute: 0 10*3/uL (ref 0.0–0.1)
EOS%: 3.2 % (ref 0.0–7.0)
Eosinophils Absolute: 0.2 10*3/uL (ref 0.0–0.5)
HCT: 37.8 % (ref 34.8–46.6)
HGB: 12.6 g/dL (ref 11.6–15.9)
MCH: 32 pg (ref 25.1–34.0)
MCV: 95.9 fL (ref 79.5–101.0)
MONO#: 0.5 10*3/uL (ref 0.1–0.9)
MONO%: 7.7 % (ref 0.0–14.0)
NEUT#: 3.6 10*3/uL (ref 1.5–6.5)
NEUT%: 57 % (ref 38.4–76.8)
RBC: 3.94 10*6/uL (ref 3.70–5.45)
WBC: 6.2 10*3/uL (ref 3.9–10.3)
lymph#: 2 10*3/uL (ref 0.9–3.3)

## 2013-08-24 MED ORDER — HEPARIN SOD (PORK) LOCK FLUSH 100 UNIT/ML IV SOLN
500.0000 [IU] | Freq: Once | INTRAVENOUS | Status: AC | PRN
  Administered 2013-08-24: 500 [IU]
  Filled 2013-08-24: qty 5

## 2013-08-24 MED ORDER — DEXAMETHASONE SODIUM PHOSPHATE 20 MG/5ML IJ SOLN
20.0000 mg | Freq: Once | INTRAMUSCULAR | Status: AC
  Administered 2013-08-24: 20 mg via INTRAVENOUS

## 2013-08-24 MED ORDER — PACLITAXEL CHEMO INJECTION 300 MG/50ML
80.0000 mg/m2 | Freq: Once | INTRAVENOUS | Status: AC
  Administered 2013-08-24: 132 mg via INTRAVENOUS
  Filled 2013-08-24: qty 22

## 2013-08-24 MED ORDER — ESCITALOPRAM OXALATE 5 MG PO TABS: 5.0000 mg | ORAL_TABLET | Freq: Every evening | ORAL | Status: DC

## 2013-08-24 MED ORDER — ONDANSETRON 16 MG/50ML IVPB (CHCC)
INTRAVENOUS | Status: AC
  Filled 2013-08-24: qty 16

## 2013-08-24 MED ORDER — DIPHENHYDRAMINE HCL 50 MG/ML IJ SOLN
INTRAMUSCULAR | Status: AC
  Filled 2013-08-24: qty 1

## 2013-08-24 MED ORDER — DEXAMETHASONE SODIUM PHOSPHATE 20 MG/5ML IJ SOLN
INTRAMUSCULAR | Status: AC
  Filled 2013-08-24: qty 5

## 2013-08-24 MED ORDER — DIPHENHYDRAMINE HCL 50 MG/ML IJ SOLN
50.0000 mg | Freq: Once | INTRAMUSCULAR | Status: AC
  Administered 2013-08-24: 50 mg via INTRAVENOUS

## 2013-08-24 MED ORDER — ONDANSETRON 16 MG/50ML IVPB (CHCC)
16.0000 mg | Freq: Once | INTRAVENOUS | Status: AC
Start: 2013-08-24 — End: 2013-08-24
  Administered 2013-08-24: 16 mg via INTRAVENOUS

## 2013-08-24 MED ORDER — SODIUM CHLORIDE 0.9 % IV SOLN
Freq: Once | INTRAVENOUS | Status: AC
  Administered 2013-08-24: 12:00:00 via INTRAVENOUS

## 2013-08-24 MED ORDER — SODIUM CHLORIDE 0.9 % IJ SOLN
10.0000 mL | INTRAMUSCULAR | Status: DC | PRN
  Administered 2013-08-24: 10 mL
  Filled 2013-08-24: qty 10

## 2013-08-24 MED ORDER — FAMOTIDINE IN NACL 20-0.9 MG/50ML-% IV SOLN
INTRAVENOUS | Status: AC
  Filled 2013-08-24: qty 50

## 2013-08-24 MED ORDER — FAMOTIDINE IN NACL 20-0.9 MG/50ML-% IV SOLN
20.0000 mg | Freq: Once | INTRAVENOUS | Status: AC
  Administered 2013-08-24: 20 mg via INTRAVENOUS

## 2013-08-24 MED ORDER — SODIUM CHLORIDE 0.9 % IV SOLN
590.0000 mg | Freq: Once | INTRAVENOUS | Status: AC
  Administered 2013-08-24: 590 mg via INTRAVENOUS
  Filled 2013-08-24: qty 59

## 2013-08-24 NOTE — Telephone Encounter (Signed)
L/m in home phone vm that lexapro prescription sent to [facility].

## 2013-08-24 NOTE — Patient Instructions (Signed)
[facility] Discharge Instructions for Patients Receiving Chemotherapy  Today you received the following chemotherapy agents Taxol/Carboplatin To help prevent nausea and vomiting after your treatment, we encourage you to take your nausea medication as prescribed.  If you develop nausea and vomiting that is not controlled by your nausea medication, call the clinic.   BELOW ARE SYMPTOMS THAT SHOULD BE REPORTED IMMEDIATELY:  *FEVER GREATER THAN 100.5 F  *CHILLS WITH OR WITHOUT FEVER  NAUSEA AND VOMITING THAT IS NOT CONTROLLED WITH YOUR NAUSEA MEDICATION  *UNUSUAL SHORTNESS OF BREATH  *UNUSUAL BRUISING OR BLEEDING  TENDERNESS IN MOUTH AND THROAT WITH OR WITHOUT PRESENCE OF ULCERS  *URINARY PROBLEMS  *BOWEL PROBLEMS  UNUSUAL RASH Items with * indicate a potential emergency and should be followed up as soon as possible.  Feel free to call the clinic you have any questions or concerns. The clinic phone number is [phone].    

## 2013-08-31 ENCOUNTER — Ambulatory Visit (HOSPITAL_BASED_OUTPATIENT_CLINIC_OR_DEPARTMENT_OTHER): Payer: Commercial Managed Care - PPO

## 2013-08-31 ENCOUNTER — Other Ambulatory Visit: Payer: Commercial Managed Care - PPO | Admitting: Lab

## 2013-08-31 ENCOUNTER — Other Ambulatory Visit (HOSPITAL_BASED_OUTPATIENT_CLINIC_OR_DEPARTMENT_OTHER): Payer: Commercial Managed Care - PPO | Admitting: Lab

## 2013-08-31 VITALS — BP 157/90 | HR 77 | Temp 97.4°F | Resp 20

## 2013-08-31 DIAGNOSIS — Z5111 Encounter for antineoplastic chemotherapy: Secondary | ICD-10-CM

## 2013-08-31 DIAGNOSIS — C561 Malignant neoplasm of right ovary: Secondary | ICD-10-CM

## 2013-08-31 DIAGNOSIS — C779 Secondary and unspecified malignant neoplasm of lymph node, unspecified: Secondary | ICD-10-CM

## 2013-08-31 DIAGNOSIS — C569 Malignant neoplasm of unspecified ovary: Secondary | ICD-10-CM

## 2013-08-31 LAB — CBC WITH DIFFERENTIAL/PLATELET
Basophils Absolute: 0 10*3/uL (ref 0.0–0.1)
Eosinophils Absolute: 0.1 10*3/uL (ref 0.0–0.5)
HCT: 32.6 % — ABNORMAL LOW (ref 34.8–46.6)
LYMPH%: 34.1 % (ref 14.0–49.7)
MCV: 94.8 fL (ref 79.5–101.0)
MONO#: 0.3 10*3/uL (ref 0.1–0.9)
MONO%: 6.2 % (ref 0.0–14.0)
NEUT#: 3.1 10*3/uL (ref 1.5–6.5)
NEUT%: 57.2 % (ref 38.4–76.8)
Platelets: 235 10*3/uL (ref 145–400)
RBC: 3.44 10*6/uL — ABNORMAL LOW (ref 3.70–5.45)
WBC: 5.5 10*3/uL (ref 3.9–10.3)

## 2013-08-31 MED ORDER — DIPHENHYDRAMINE HCL 50 MG/ML IJ SOLN
INTRAMUSCULAR | Status: AC
  Filled 2013-08-31: qty 1

## 2013-08-31 MED ORDER — PACLITAXEL CHEMO INJECTION 300 MG/50ML
80.0000 mg/m2 | Freq: Once | INTRAVENOUS | Status: AC
  Administered 2013-08-31: 132 mg via INTRAVENOUS
  Filled 2013-08-31: qty 22

## 2013-08-31 MED ORDER — HEPARIN SOD (PORK) LOCK FLUSH 100 UNIT/ML IV SOLN
500.0000 [IU] | Freq: Once | INTRAVENOUS | Status: AC | PRN
  Administered 2013-08-31: 500 [IU]
  Filled 2013-08-31: qty 5

## 2013-08-31 MED ORDER — SODIUM CHLORIDE 0.9 % IJ SOLN
10.0000 mL | INTRAMUSCULAR | Status: DC | PRN
  Administered 2013-08-31: 10 mL
  Filled 2013-08-31: qty 10

## 2013-08-31 MED ORDER — SODIUM CHLORIDE 0.9 % IV SOLN
Freq: Once | INTRAVENOUS | Status: AC
  Administered 2013-08-31: 15:00:00 via INTRAVENOUS

## 2013-08-31 MED ORDER — DEXAMETHASONE SODIUM PHOSPHATE 20 MG/5ML IJ SOLN
INTRAMUSCULAR | Status: AC
  Filled 2013-08-31: qty 5

## 2013-08-31 MED ORDER — DEXAMETHASONE SODIUM PHOSPHATE 20 MG/5ML IJ SOLN
20.0000 mg | Freq: Once | INTRAMUSCULAR | Status: AC
  Administered 2013-08-31: 20 mg via INTRAVENOUS

## 2013-08-31 MED ORDER — FAMOTIDINE IN NACL 20-0.9 MG/50ML-% IV SOLN
20.0000 mg | Freq: Once | INTRAVENOUS | Status: AC
Start: 2013-08-31 — End: 2013-08-31
  Administered 2013-08-31: 20 mg via INTRAVENOUS

## 2013-08-31 MED ORDER — ONDANSETRON 8 MG/NS 50 ML IVPB
INTRAVENOUS | Status: AC
  Filled 2013-08-31: qty 8

## 2013-08-31 MED ORDER — DIPHENHYDRAMINE HCL 50 MG/ML IJ SOLN
50.0000 mg | Freq: Once | INTRAMUSCULAR | Status: AC
  Administered 2013-08-31: 50 mg via INTRAVENOUS

## 2013-08-31 MED ORDER — ONDANSETRON 8 MG/50ML IVPB (CHCC)
8.0000 mg | Freq: Once | INTRAVENOUS | Status: AC
  Administered 2013-08-31: 8 mg via INTRAVENOUS

## 2013-08-31 MED ORDER — FAMOTIDINE IN NACL 20-0.9 MG/50ML-% IV SOLN
INTRAVENOUS | Status: AC
  Filled 2013-08-31: qty 50

## 2013-08-31 NOTE — Patient Instructions (Signed)
[facility] Discharge Instructions for Patients Receiving Chemotherapy  Today you received the following chemotherapy agent: Taxol   To help prevent nausea and vomiting after your treatment, we encourage you to take your nausea medication as prescribed.    If you develop nausea and vomiting that is not controlled by your nausea medication, call the clinic.   BELOW ARE SYMPTOMS THAT SHOULD BE REPORTED IMMEDIATELY:  *FEVER GREATER THAN 100.5 F  *CHILLS WITH OR WITHOUT FEVER  NAUSEA AND VOMITING THAT IS NOT CONTROLLED WITH YOUR NAUSEA MEDICATION  *UNUSUAL SHORTNESS OF BREATH  *UNUSUAL BRUISING OR BLEEDING  TENDERNESS IN MOUTH AND THROAT WITH OR WITHOUT PRESENCE OF ULCERS  *URINARY PROBLEMS  *BOWEL PROBLEMS  UNUSUAL RASH Items with * indicate a potential emergency and should be followed up as soon as possible.  Feel free to call the clinic you have any questions or concerns. The clinic phone number is [phone].    

## 2013-09-04 ENCOUNTER — Other Ambulatory Visit: Payer: Self-pay | Admitting: Oncology

## 2013-09-05 ENCOUNTER — Ambulatory Visit (HOSPITAL_BASED_OUTPATIENT_CLINIC_OR_DEPARTMENT_OTHER): Payer: Commercial Managed Care - PPO | Admitting: Oncology

## 2013-09-05 ENCOUNTER — Telehealth: Payer: Self-pay | Admitting: Oncology

## 2013-09-05 ENCOUNTER — Telehealth: Payer: Self-pay | Admitting: *Deleted

## 2013-09-05 ENCOUNTER — Other Ambulatory Visit (HOSPITAL_BASED_OUTPATIENT_CLINIC_OR_DEPARTMENT_OTHER): Payer: Commercial Managed Care - PPO | Admitting: Lab

## 2013-09-05 ENCOUNTER — Encounter: Payer: Self-pay | Admitting: Oncology

## 2013-09-05 VITALS — BP 138/74 | HR 93 | Temp 98.4°F | Resp 18 | Ht 66.0 in | Wt 143.4 lb

## 2013-09-05 DIAGNOSIS — D6481 Anemia due to antineoplastic chemotherapy: Secondary | ICD-10-CM

## 2013-09-05 DIAGNOSIS — G4700 Insomnia, unspecified: Secondary | ICD-10-CM

## 2013-09-05 DIAGNOSIS — C561 Malignant neoplasm of right ovary: Secondary | ICD-10-CM

## 2013-09-05 DIAGNOSIS — C569 Malignant neoplasm of unspecified ovary: Secondary | ICD-10-CM

## 2013-09-05 LAB — CBC WITH DIFFERENTIAL/PLATELET
Basophils Absolute: 0 10*3/uL (ref 0.0–0.1)
EOS%: 1.1 % (ref 0.0–7.0)
Eosinophils Absolute: 0.1 10*3/uL (ref 0.0–0.5)
HCT: 34.7 % — ABNORMAL LOW (ref 34.8–46.6)
HGB: 11.3 g/dL — ABNORMAL LOW (ref 11.6–15.9)
MCH: 31.1 pg (ref 25.1–34.0)
MCV: 95.6 fL (ref 79.5–101.0)
MONO#: 0.2 10*3/uL (ref 0.1–0.9)
NEUT%: 61.3 % (ref 38.4–76.8)
WBC: 4.6 10*3/uL (ref 3.9–10.3)
lymph#: 1.5 10*3/uL (ref 0.9–3.3)

## 2013-09-05 LAB — COMPREHENSIVE METABOLIC PANEL (CC13)
ALT: 13 U/L (ref 0–55)
AST: 13 U/L (ref 5–34)
Anion Gap: 8 mEq/L (ref 3–11)
CO2: 27 mEq/L (ref 22–29)
Creatinine: 0.7 mg/dL (ref 0.6–1.1)
Glucose: 108 mg/dl (ref 70–140)
Total Bilirubin: <0.2 mg/dL (ref 0.20–1.20)

## 2013-09-05 MED ORDER — FERROUS FUMARATE 325 (106 FE) MG PO TABS: ORAL_TABLET | ORAL | Status: DC

## 2013-09-05 MED ORDER — ZOLPIDEM TARTRATE 5 MG PO TABS: 5.0000 mg | ORAL_TABLET | Freq: Every evening | ORAL | Status: DC | PRN

## 2013-09-05 NOTE — Patient Instructions (Signed)
We will send prescription for iron to pharmacy. If your insurance will not cover the prescription [facility], we will tell you which over the counter iron to get. Iron is best absorbed if taken on empty stomach with OJ, if you can tolerate that. OK to skip days here or there with the iron if nausea from chemo

## 2013-09-05 NOTE — Progress Notes (Signed)
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 She continues to tolerate chemotherapy generally well, only mild queasiness particularly after day 1 treatment with carbo + taxol, controlled with prn antiemetics. Bowels are moving regularly with soft stool. She has completed post op lovenox without any bleeding. Energy is good overall, tho she is tired by evening. She is still limiting activity, not lifting >10 lbs. She has no significant peripheral neuropathy. She has PAC.   ONCOLOGIC HISTORY atient has seen Dr Leonette Most Lomax/ NP Eve regularly for years and was up to date on gyn exams at the time of this diagnosis. She presented with several months of slowly enlarging left supraclavicular adenopathy, measuring ~ 2 cm at time of biopsy by Dr Joline Maxcy 01-22-12. Pathology [phone]) showed metastatic adenocarcinoma with immunohistochemical profile consistent with gyn vs breast primary, including ER PR +. CA 125 was 1202. Breast imaging 01-28-13 at [facility] (bilateral diagnostic mammograms and [place]) had no findings of concern. PET/CT 02-03-13 in [facility] system found bilateral hypermetabolic supraclavicular and mediastinal adenopathy; 8.7 x 8.7 hypermetabolic cystic and solid pelvic mass with adjacent similar mass in right iliac fossa; multiple hypermetabolic peritoneal implants including area 5.5 x 3.5 cm at liver margin; no pulmonary or bony lesions. She was seen by Dr Laurette Schimke, with recommendation for dose dense taxol carboplatin, this given x 6 cycles from 02-09-2013 thru 06-16-2013, tolerated generally well without gCSF or any dose reductions. CT CAP 05-09-13 after 4 cycles of chemotherapy found radiographic  resolution of supraclavicular adenopathy, no findings of concern in chest, near complete resolution of bulky peritoneal implants and marked improvement in abdominal and pelvic mass. She had two additional cycles of chemotherapy thru 06-16-2013, then interval surgery by Dr [name] on 07-26-13. Surgery was TAH BSO omentectomy and resection of peritoneal nodules, with no gross residual disease in abdomen or pelvis at completion of procedure. Plan is to continue same dose dense chemo for 3 additional cycles post surgery.    Review of systems as above, also: No fever or symptoms of infection. No LE swelling. Bladder ok. Not SOB coming into office today. Uses ambien when unable to sleep nights of steroids. Remainder of 10 point Review of Systems negative.  Objective:  Vital signs in last 24 hours:  BP 138/74  Pulse 93  Temp(Src) 98.4 F (36.9 C) (Oral)  Resp 18  Ht 5\' 6"  (1.676 m)  Wt 143 lb 6.4 oz (65.046 kg)  BMI 23.16 kg/m2 Weight is stable. Alert, oriented and appropriate. Ambulatory without difficulty.  Very pleasant and upbeat as always, appears comfortable.  HEENT:PERRL, sclerae not icteric. Oral mucosa moist without lesions, posterior pharynx clear.  Neck supple. No JVD.  Lymphatics:no cervical,suraclavicular, axillary or inguinal adenopathy Resp: clear to auscultation bilaterally and normal percussion bilaterally Cardio: regular rate and rhythm. No gallop. GI: soft, nontender, not distended, no mass or organomegaly. Normally active bowel sounds. Surgical incision closed, not remarkable. Musculoskeletal/ Extremities: without pitting edema, cords, tenderness Neuro: no peripheral neuropathy. Otherwise nonfocal Skin without rash or petechiae. Resolving ecchymoses across abdomen from lovenox. Breasts: without dominant mass, skin or nipple findings. Axillae benign. Portacath-without erythema or tenderness  Lab Results:  Results for orders placed in visit on 09/05/13  CBC  WITH  DIFFERENTIAL      Result Value Range   WBC 4.6  3.9 - 10.3 10e3/uL   NEUT# 2.8  1.5 - 6.5 10e3/uL   HGB 11.3 (*) 11.6 - 15.9 g/dL   HCT 16.1 (*) 09.6 - 04.5 %   Platelets 211  145 - 400 10e3/uL   MCV 95.6  79.5 - 101.0 fL   MCH 31.1  25.1 - 34.0 pg   MCHC 32.6  31.5 - 36.0 g/dL   RBC 4.09 (*) 8.11 - 9.14 10e6/uL   RDW 12.1  11.2 - 14.5 %   lymph# 1.5  0.9 - 3.3 10e3/uL   MONO# 0.2  0.1 - 0.9 10e3/uL   Eosinophils Absolute 0.1  0.0 - 0.5 10e3/uL   Basophils Absolute 0.0  0.0 - 0.1 10e3/uL   NEUT% 61.3  38.4 - 76.8 %   LYMPH% 32.8  14.0 - 49.7 %   MONO% 4.6  0.0 - 14.0 %   EOS% 1.1  0.0 - 7.0 %   BASO% 0.2  0.0 - 2.0 %    CMET available after visit normal including K 4.4 and creatinine 0.7  Will add CA 125 with next chemistries, but have not done today still close to surgery.  Studies/Results:  No results found. Last CT AP 04-2013.  Medications: I have reviewed the patient's current medications. She has not been on supplemental iron which we will begin as Hemocyte if insurance will cover, otherwise OTC ferrous fumarate; ok to hold iron intermittently if nausea post chemo  DISCUSSION: patient would prefer no treatment week of Christmas, which is appropriate at this point in her treatment.  Assessment/Plan: 1.IVB adenocarcinoma apparently left ovarian primary: left supraclavicular node + by biopsy at presentation 01-2013. Excellent response clinically to 6 cycles of dose dense taxol carboplatin, followed by TAH/ BSO/omentectomy/ resection of peritoneal nodules 07-26-13, now for total 3 additional cycles of same chemotherapy. I will see her during upcoming cycle 8. 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 If she has screening mammograms next, should consider 3D/ tomo 6.mild anemia: from chemo, multiple blood draws and surgery. Add oral iron and follow.   Patient is in agreement with plan and has had questions  answered to her satisfaction    Reece Packer, MD   09/05/2013, 1:57 PM

## 2013-09-05 NOTE — Telephone Encounter (Signed)
Per staff message and POF I have scheduled appts.  [name]  

## 2013-09-07 ENCOUNTER — Other Ambulatory Visit (HOSPITAL_BASED_OUTPATIENT_CLINIC_OR_DEPARTMENT_OTHER): Payer: Commercial Managed Care - PPO

## 2013-09-07 ENCOUNTER — Other Ambulatory Visit: Payer: Self-pay | Admitting: Oncology

## 2013-09-07 ENCOUNTER — Ambulatory Visit (HOSPITAL_BASED_OUTPATIENT_CLINIC_OR_DEPARTMENT_OTHER): Payer: Commercial Managed Care - PPO

## 2013-09-07 VITALS — BP 131/97 | HR 81 | Temp 98.6°F | Resp 18

## 2013-09-07 DIAGNOSIS — C779 Secondary and unspecified malignant neoplasm of lymph node, unspecified: Secondary | ICD-10-CM

## 2013-09-07 DIAGNOSIS — C561 Malignant neoplasm of right ovary: Secondary | ICD-10-CM

## 2013-09-07 DIAGNOSIS — D6481 Anemia due to antineoplastic chemotherapy: Secondary | ICD-10-CM

## 2013-09-07 DIAGNOSIS — C569 Malignant neoplasm of unspecified ovary: Secondary | ICD-10-CM

## 2013-09-07 DIAGNOSIS — Z5111 Encounter for antineoplastic chemotherapy: Secondary | ICD-10-CM

## 2013-09-07 LAB — CBC WITH DIFFERENTIAL/PLATELET
BASO%: 0.4 % (ref 0.0–2.0)
Basophils Absolute: 0 10*3/uL (ref 0.0–0.1)
EOS%: 1.4 % (ref 0.0–7.0)
HCT: 34.7 % — ABNORMAL LOW (ref 34.8–46.6)
HGB: 11.5 g/dL — ABNORMAL LOW (ref 11.6–15.9)
LYMPH%: 31.7 % (ref 14.0–49.7)
MCH: 31.5 pg (ref 25.1–34.0)
MCHC: 33.1 g/dL (ref 31.5–36.0)
MONO#: 0.4 10*3/uL (ref 0.1–0.9)
NEUT#: 3 10*3/uL (ref 1.5–6.5)
NEUT%: 58.7 % (ref 38.4–76.8)
Platelets: 216 10*3/uL (ref 145–400)
RBC: 3.65 10*6/uL — ABNORMAL LOW (ref 3.70–5.45)
RDW: 12.1 % (ref 11.2–14.5)
lymph#: 1.6 10*3/uL (ref 0.9–3.3)

## 2013-09-07 MED ORDER — PACLITAXEL CHEMO INJECTION 300 MG/50ML
80.0000 mg/m2 | Freq: Once | INTRAVENOUS | Status: AC
  Administered 2013-09-07: 132 mg via INTRAVENOUS
  Filled 2013-09-07: qty 22

## 2013-09-07 MED ORDER — DIPHENHYDRAMINE HCL 50 MG/ML IJ SOLN
INTRAMUSCULAR | Status: AC
  Filled 2013-09-07: qty 1

## 2013-09-07 MED ORDER — ONDANSETRON 8 MG/50ML IVPB (CHCC)
8.0000 mg | Freq: Once | INTRAVENOUS | Status: AC
  Administered 2013-09-07: 8 mg via INTRAVENOUS

## 2013-09-07 MED ORDER — DEXAMETHASONE SODIUM PHOSPHATE 20 MG/5ML IJ SOLN
INTRAMUSCULAR | Status: AC
  Filled 2013-09-07: qty 5

## 2013-09-07 MED ORDER — HEPARIN SOD (PORK) LOCK FLUSH 100 UNIT/ML IV SOLN
500.0000 [IU] | Freq: Once | INTRAVENOUS | Status: AC | PRN
  Administered 2013-09-07: 500 [IU]
  Filled 2013-09-07: qty 5

## 2013-09-07 MED ORDER — DEXAMETHASONE SODIUM PHOSPHATE 20 MG/5ML IJ SOLN
20.0000 mg | Freq: Once | INTRAMUSCULAR | Status: AC
  Administered 2013-09-07: 20 mg via INTRAVENOUS

## 2013-09-07 MED ORDER — FAMOTIDINE IN NACL 20-0.9 MG/50ML-% IV SOLN
INTRAVENOUS | Status: AC
  Filled 2013-09-07: qty 50

## 2013-09-07 MED ORDER — ONDANSETRON 8 MG/NS 50 ML IVPB
INTRAVENOUS | Status: AC
  Filled 2013-09-07: qty 8

## 2013-09-07 MED ORDER — SODIUM CHLORIDE 0.9 % IJ SOLN
10.0000 mL | INTRAMUSCULAR | Status: DC | PRN
  Administered 2013-09-07: 10 mL
  Filled 2013-09-07: qty 10

## 2013-09-07 MED ORDER — SODIUM CHLORIDE 0.9 % IV SOLN
Freq: Once | INTRAVENOUS | Status: AC
  Administered 2013-09-07: 11:00:00 via INTRAVENOUS

## 2013-09-07 MED ORDER — DIPHENHYDRAMINE HCL 50 MG/ML IJ SOLN
50.0000 mg | Freq: Once | INTRAMUSCULAR | Status: AC
  Administered 2013-09-07: 50 mg via INTRAVENOUS

## 2013-09-07 MED ORDER — FAMOTIDINE IN NACL 20-0.9 MG/50ML-% IV SOLN
20.0000 mg | Freq: Once | INTRAVENOUS | Status: AC
  Administered 2013-09-07: 20 mg via INTRAVENOUS

## 2013-09-07 NOTE — Patient Instructions (Signed)
[facility] Discharge Instructions for Patients Receiving Chemotherapy  Today you received the following chemotherapy agents Taxol.  To help prevent nausea and vomiting after your treatment, we encourage you to take your nausea medication Zofran.  Take twice daily for 3 days, beginning the morning of 08/2713.  Take compazine every 6 hours as needed.  If you develop nausea and vomiting that is not controlled by your nausea medication, call the clinic.   BELOW ARE SYMPTOMS THAT SHOULD BE REPORTED IMMEDIATELY:  *FEVER GREATER THAN 100.5 F  *CHILLS WITH OR WITHOUT FEVER  NAUSEA AND VOMITING THAT IS NOT CONTROLLED WITH YOUR NAUSEA MEDICATION  *UNUSUAL SHORTNESS OF BREATH  *UNUSUAL BRUISING OR BLEEDING  TENDERNESS IN MOUTH AND THROAT WITH OR WITHOUT PRESENCE OF ULCERS  *URINARY PROBLEMS  *BOWEL PROBLEMS  UNUSUAL RASH Items with * indicate a potential emergency and should be followed up as soon as possible.  Feel free to call the clinic should you have any questions or concerns. The clinic phone number is [phone].

## 2013-09-08 LAB — CA 125: CA 125: 15.9 U/mL (ref 0.0–30.2)

## 2013-09-12 ENCOUNTER — Telehealth: Payer: Self-pay | Admitting: *Deleted

## 2013-09-12 ENCOUNTER — Other Ambulatory Visit: Payer: Self-pay | Admitting: Oncology

## 2013-09-12 DIAGNOSIS — C561 Malignant neoplasm of right ovary: Secondary | ICD-10-CM

## 2013-09-12 NOTE — Telephone Encounter (Signed)
sw pt gv appts for labs on 09/15/13 @ 8:30am and all your other lab d/t. Pt will get an avs on 09/15/13...td

## 2013-09-15 ENCOUNTER — Other Ambulatory Visit (HOSPITAL_BASED_OUTPATIENT_CLINIC_OR_DEPARTMENT_OTHER): Payer: Commercial Managed Care - PPO | Admitting: Lab

## 2013-09-15 ENCOUNTER — Encounter (INDEPENDENT_AMBULATORY_CARE_PROVIDER_SITE_OTHER): Payer: Self-pay

## 2013-09-15 ENCOUNTER — Ambulatory Visit (HOSPITAL_BASED_OUTPATIENT_CLINIC_OR_DEPARTMENT_OTHER): Payer: Commercial Managed Care - PPO

## 2013-09-15 VITALS — BP 145/75 | HR 82 | Temp 97.1°F | Resp 16

## 2013-09-15 DIAGNOSIS — C561 Malignant neoplasm of right ovary: Secondary | ICD-10-CM

## 2013-09-15 DIAGNOSIS — C569 Malignant neoplasm of unspecified ovary: Secondary | ICD-10-CM

## 2013-09-15 DIAGNOSIS — Z5111 Encounter for antineoplastic chemotherapy: Secondary | ICD-10-CM

## 2013-09-15 DIAGNOSIS — C779 Secondary and unspecified malignant neoplasm of lymph node, unspecified: Secondary | ICD-10-CM

## 2013-09-15 LAB — CBC WITH DIFFERENTIAL/PLATELET
BASO%: 0.6 % (ref 0.0–2.0)
Basophils Absolute: 0 10*3/uL (ref 0.0–0.1)
LYMPH%: 35.3 % (ref 14.0–49.7)
MCHC: 33 g/dL (ref 31.5–36.0)
MONO#: 0.5 10*3/uL (ref 0.1–0.9)
MONO%: 10.6 % (ref 0.0–14.0)
Platelets: 220 10*3/uL (ref 145–400)
RBC: 3.62 10*6/uL — ABNORMAL LOW (ref 3.70–5.45)
RDW: 12.5 % (ref 11.2–14.5)
WBC: 5 10*3/uL (ref 3.9–10.3)
lymph#: 1.8 10*3/uL (ref 0.9–3.3)

## 2013-09-15 MED ORDER — FAMOTIDINE IN NACL 20-0.9 MG/50ML-% IV SOLN
20.0000 mg | Freq: Once | INTRAVENOUS | Status: AC
  Administered 2013-09-15: 20 mg via INTRAVENOUS

## 2013-09-15 MED ORDER — CARBOPLATIN CHEMO INTRADERMAL TEST DOSE 100MCG/0.02ML
100.0000 ug | Freq: Once | INTRADERMAL | Status: AC
  Administered 2013-09-15: 100 ug via INTRADERMAL
  Filled 2013-09-15: qty 0.01

## 2013-09-15 MED ORDER — DIPHENHYDRAMINE HCL 50 MG/ML IJ SOLN
INTRAMUSCULAR | Status: AC
  Filled 2013-09-15: qty 1

## 2013-09-15 MED ORDER — SODIUM CHLORIDE 0.9 % IV SOLN
Freq: Once | INTRAVENOUS | Status: AC
  Administered 2013-09-15: 09:00:00 via INTRAVENOUS

## 2013-09-15 MED ORDER — DEXAMETHASONE SODIUM PHOSPHATE 20 MG/5ML IJ SOLN
INTRAMUSCULAR | Status: AC
  Filled 2013-09-15: qty 5

## 2013-09-15 MED ORDER — SODIUM CHLORIDE 0.9 % IJ SOLN
10.0000 mL | INTRAMUSCULAR | Status: DC | PRN
  Administered 2013-09-15: 10 mL
  Filled 2013-09-15: qty 10

## 2013-09-15 MED ORDER — ONDANSETRON 16 MG/50ML IVPB (CHCC)
INTRAVENOUS | Status: AC
  Filled 2013-09-15: qty 16

## 2013-09-15 MED ORDER — SODIUM CHLORIDE 0.9 % IV SOLN
590.0000 mg | Freq: Once | INTRAVENOUS | Status: AC
  Administered 2013-09-15: 590 mg via INTRAVENOUS
  Filled 2013-09-15: qty 59

## 2013-09-15 MED ORDER — SODIUM CHLORIDE 0.9 % IV SOLN
80.0000 mg/m2 | Freq: Once | INTRAVENOUS | Status: AC
  Administered 2013-09-15: 132 mg via INTRAVENOUS
  Filled 2013-09-15: qty 22

## 2013-09-15 MED ORDER — DIPHENHYDRAMINE HCL 50 MG/ML IJ SOLN
50.0000 mg | Freq: Once | INTRAMUSCULAR | Status: AC
  Administered 2013-09-15: 50 mg via INTRAVENOUS

## 2013-09-15 MED ORDER — FAMOTIDINE IN NACL 20-0.9 MG/50ML-% IV SOLN
INTRAVENOUS | Status: AC
  Filled 2013-09-15: qty 50

## 2013-09-15 MED ORDER — DEXAMETHASONE SODIUM PHOSPHATE 20 MG/5ML IJ SOLN
20.0000 mg | Freq: Once | INTRAMUSCULAR | Status: AC
  Administered 2013-09-15: 20 mg via INTRAVENOUS

## 2013-09-15 MED ORDER — ONDANSETRON 16 MG/50ML IVPB (CHCC)
16.0000 mg | Freq: Once | INTRAVENOUS | Status: AC
  Administered 2013-09-15: 16 mg via INTRAVENOUS

## 2013-09-15 MED ORDER — HEPARIN SOD (PORK) LOCK FLUSH 100 UNIT/ML IV SOLN
500.0000 [IU] | Freq: Once | INTRAVENOUS | Status: AC | PRN
  Administered 2013-09-15: 500 [IU]
  Filled 2013-09-15: qty 5

## 2013-09-15 NOTE — Patient Instructions (Signed)
[facility] Discharge Instructions for Patients Receiving Chemotherapy  Today you received the following chemotherapy agents Taxol/Carboplatin To help prevent nausea and vomiting after your treatment, we encourage you to take your nausea medication as prescribed.  If you develop nausea and vomiting that is not controlled by your nausea medication, call the clinic.   BELOW ARE SYMPTOMS THAT SHOULD BE REPORTED IMMEDIATELY:  *FEVER GREATER THAN 100.5 F  *CHILLS WITH OR WITHOUT FEVER  NAUSEA AND VOMITING THAT IS NOT CONTROLLED WITH YOUR NAUSEA MEDICATION  *UNUSUAL SHORTNESS OF BREATH  *UNUSUAL BRUISING OR BLEEDING  TENDERNESS IN MOUTH AND THROAT WITH OR WITHOUT PRESENCE OF ULCERS  *URINARY PROBLEMS  *BOWEL PROBLEMS  UNUSUAL RASH Items with * indicate a potential emergency and should be followed up as soon as possible.  Feel free to call the clinic you have any questions or concerns. The clinic phone number is [phone].    

## 2013-09-18 ENCOUNTER — Other Ambulatory Visit: Payer: Self-pay | Admitting: Oncology

## 2013-09-22 ENCOUNTER — Other Ambulatory Visit (HOSPITAL_BASED_OUTPATIENT_CLINIC_OR_DEPARTMENT_OTHER): Payer: Commercial Managed Care - PPO

## 2013-09-22 ENCOUNTER — Ambulatory Visit (HOSPITAL_BASED_OUTPATIENT_CLINIC_OR_DEPARTMENT_OTHER): Payer: Commercial Managed Care - PPO

## 2013-09-22 VITALS — BP 140/74 | HR 90 | Temp 98.4°F | Resp 16

## 2013-09-22 DIAGNOSIS — C779 Secondary and unspecified malignant neoplasm of lymph node, unspecified: Secondary | ICD-10-CM

## 2013-09-22 DIAGNOSIS — C561 Malignant neoplasm of right ovary: Secondary | ICD-10-CM

## 2013-09-22 DIAGNOSIS — C569 Malignant neoplasm of unspecified ovary: Secondary | ICD-10-CM

## 2013-09-22 DIAGNOSIS — Z5111 Encounter for antineoplastic chemotherapy: Secondary | ICD-10-CM

## 2013-09-22 LAB — CBC WITH DIFFERENTIAL/PLATELET
BASO%: 0.4 % (ref 0.0–2.0)
Basophils Absolute: 0 10*3/uL (ref 0.0–0.1)
LYMPH%: 39 % (ref 14.0–49.7)
MCH: 31.3 pg (ref 25.1–34.0)
MCHC: 33.3 g/dL (ref 31.5–36.0)
MCV: 93.9 fL (ref 79.5–101.0)
MONO%: 7.6 % (ref 0.0–14.0)
NEUT#: 2.6 10*3/uL (ref 1.5–6.5)
NEUT%: 52 % (ref 38.4–76.8)
Platelets: 213 10*3/uL (ref 145–400)
RBC: 3.8 10*6/uL (ref 3.70–5.45)
RDW: 12.4 % (ref 11.2–14.5)

## 2013-09-22 MED ORDER — DIPHENHYDRAMINE HCL 50 MG/ML IJ SOLN
INTRAMUSCULAR | Status: AC
  Filled 2013-09-22: qty 1

## 2013-09-22 MED ORDER — DEXAMETHASONE SODIUM PHOSPHATE 20 MG/5ML IJ SOLN
INTRAMUSCULAR | Status: AC
  Filled 2013-09-22: qty 5

## 2013-09-22 MED ORDER — ONDANSETRON 8 MG/50ML IVPB (CHCC)
8.0000 mg | Freq: Once | INTRAVENOUS | Status: AC
  Administered 2013-09-22: 8 mg via INTRAVENOUS

## 2013-09-22 MED ORDER — SODIUM CHLORIDE 0.9 % IV SOLN
Freq: Once | INTRAVENOUS | Status: AC
  Administered 2013-09-22: 11:00:00 via INTRAVENOUS

## 2013-09-22 MED ORDER — DEXAMETHASONE SODIUM PHOSPHATE 20 MG/5ML IJ SOLN
20.0000 mg | Freq: Once | INTRAMUSCULAR | Status: AC
  Administered 2013-09-22: 20 mg via INTRAVENOUS

## 2013-09-22 MED ORDER — FAMOTIDINE IN NACL 20-0.9 MG/50ML-% IV SOLN
INTRAVENOUS | Status: AC
  Filled 2013-09-22: qty 50

## 2013-09-22 MED ORDER — ONDANSETRON 8 MG/NS 50 ML IVPB
INTRAVENOUS | Status: AC
  Filled 2013-09-22: qty 8

## 2013-09-22 MED ORDER — SODIUM CHLORIDE 0.9 % IV SOLN
80.0000 mg/m2 | Freq: Once | INTRAVENOUS | Status: AC
  Administered 2013-09-22: 132 mg via INTRAVENOUS
  Filled 2013-09-22: qty 22

## 2013-09-22 MED ORDER — DIPHENHYDRAMINE HCL 50 MG/ML IJ SOLN
50.0000 mg | Freq: Once | INTRAMUSCULAR | Status: AC
  Administered 2013-09-22: 50 mg via INTRAVENOUS

## 2013-09-22 MED ORDER — HEPARIN SOD (PORK) LOCK FLUSH 100 UNIT/ML IV SOLN
500.0000 [IU] | Freq: Once | INTRAVENOUS | Status: AC | PRN
  Administered 2013-09-22: 500 [IU]
  Filled 2013-09-22: qty 5

## 2013-09-22 MED ORDER — FAMOTIDINE IN NACL 20-0.9 MG/50ML-% IV SOLN
20.0000 mg | Freq: Once | INTRAVENOUS | Status: AC
  Administered 2013-09-22: 20 mg via INTRAVENOUS

## 2013-09-22 MED ORDER — SODIUM CHLORIDE 0.9 % IJ SOLN
10.0000 mL | INTRAMUSCULAR | Status: DC | PRN
  Administered 2013-09-22: 10 mL
  Filled 2013-09-22: qty 10

## 2013-09-22 NOTE — Patient Instructions (Signed)
[facility] Discharge Instructions for Patients Receiving Chemotherapy  Today you received the following chemotherapy agents: taxol  To help prevent nausea and vomiting after your treatment, we encourage you to take your nausea medication.  Take it as often as prescribed.     If you develop nausea and vomiting that is not controlled by your nausea medication, call the clinic. If it is after clinic hours your family physician or the after hours number for the clinic or go to the Emergency Department.   BELOW ARE SYMPTOMS THAT SHOULD BE REPORTED IMMEDIATELY:  *FEVER GREATER THAN 100.5 F  *CHILLS WITH OR WITHOUT FEVER  NAUSEA AND VOMITING THAT IS NOT CONTROLLED WITH YOUR NAUSEA MEDICATION  *UNUSUAL SHORTNESS OF BREATH  *UNUSUAL BRUISING OR BLEEDING  TENDERNESS IN MOUTH AND THROAT WITH OR WITHOUT PRESENCE OF ULCERS  *URINARY PROBLEMS  *BOWEL PROBLEMS  UNUSUAL RASH Items with * indicate a potential emergency and should be followed up as soon as possible.  Feel free to call the clinic you have any questions or concerns. The clinic phone number is [phone].   I have been informed and understand all the instructions given to me. I know to contact the clinic, my physician, or go to the Emergency Department if any problems should occur. I do not have any questions at this time, but understand that I Hegel call the clinic during office hours   should I have any questions or need assistance in obtaining follow up care.    __________________________________________  _____________  __________ Signature of Patient or Authorized Representative            Date                   Time    __________________________________________ Nurse's Signature    

## 2013-09-23 ENCOUNTER — Other Ambulatory Visit: Payer: Self-pay | Admitting: Oncology

## 2013-09-26 ENCOUNTER — Other Ambulatory Visit (HOSPITAL_BASED_OUTPATIENT_CLINIC_OR_DEPARTMENT_OTHER): Payer: Commercial Managed Care - PPO

## 2013-09-26 ENCOUNTER — Encounter: Payer: Self-pay | Admitting: Oncology

## 2013-09-26 ENCOUNTER — Encounter (INDEPENDENT_AMBULATORY_CARE_PROVIDER_SITE_OTHER): Payer: Self-pay

## 2013-09-26 ENCOUNTER — Ambulatory Visit (HOSPITAL_BASED_OUTPATIENT_CLINIC_OR_DEPARTMENT_OTHER): Payer: Commercial Managed Care - PPO | Admitting: Oncology

## 2013-09-26 VITALS — BP 148/87 | HR 90 | Temp 98.7°F | Resp 18 | Ht 66.0 in | Wt 146.8 lb

## 2013-09-26 DIAGNOSIS — C569 Malignant neoplasm of unspecified ovary: Secondary | ICD-10-CM

## 2013-09-26 DIAGNOSIS — D6481 Anemia due to antineoplastic chemotherapy: Secondary | ICD-10-CM

## 2013-09-26 DIAGNOSIS — R5381 Other malaise: Secondary | ICD-10-CM

## 2013-09-26 DIAGNOSIS — C770 Secondary and unspecified malignant neoplasm of lymph nodes of head, face and neck: Secondary | ICD-10-CM

## 2013-09-26 DIAGNOSIS — C561 Malignant neoplasm of right ovary: Secondary | ICD-10-CM

## 2013-09-26 LAB — CBC WITH DIFFERENTIAL/PLATELET
Basophils Absolute: 0 10*3/uL (ref 0.0–0.1)
Eosinophils Absolute: 0 10*3/uL (ref 0.0–0.5)
HCT: 34.3 % — ABNORMAL LOW (ref 34.8–46.6)
HGB: 11.5 g/dL — ABNORMAL LOW (ref 11.6–15.9)
LYMPH%: 35.6 % (ref 14.0–49.7)
MCHC: 33.5 g/dL (ref 31.5–36.0)
MONO#: 0.3 10*3/uL (ref 0.1–0.9)
NEUT#: 2.6 10*3/uL (ref 1.5–6.5)
NEUT%: 57.3 % (ref 38.4–76.8)
Platelets: 204 10*3/uL (ref 145–400)
WBC: 4.5 10*3/uL (ref 3.9–10.3)

## 2013-09-26 MED ORDER — ESCITALOPRAM OXALATE 5 MG PO TABS: 5.0000 mg | ORAL_TABLET | Freq: Every day | ORAL | Status: DC

## 2013-09-26 NOTE — Progress Notes (Signed)
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 She has had good response to treatment to this point, and has tolerated this well. She is due day 15 cycle 8 dose dense taxol carbo on 09-29-13, then will begin cycle 9 the week after Christmas. She will have restaging CT CAP after cycle 9, prior to seeing Dr [name] on 11-24-2013. Patient was more constipated with addition of oral iron in late Nov, with no bowel movement x 5 days before that resolved with increase in miralax and holding iron. Bowels are moving daily now. She has some fatigue by end of day, but is otherwise going about most of regular activities. She has no peripheral neuropathy symptoms. She has PAC.  Oncologic History Patient saw Dr Leonette Most Lomax/ NP Eve regularly for years and was up to date on gyn exams at the time of this diagnosis. She presented with several months of slowly enlarging left supraclavicular adenopathy, measuring ~ 2 cm at time of biopsy by Dr Joline Maxcy 01-22-12. Pathology [id number]) showed metastatic adenocarcinoma with immunohistochemical profile consistent with gyn vs breast primary, including ER PR +. CA 125 was 1202. Breast imaging 01-28-13 at [facility] (bilateral diagnostic mammograms and [place]) had no findings of concern. PET/CT 02-03-13 in [facility] system found bilateral hypermetabolic supraclavicular and mediastinal adenopathy; 8.7 x 8.7 hypermetabolic cystic and solid pelvic mass with adjacent similar mass in right iliac fossa; multiple hypermetabolic peritoneal implants including area 5.5 x 3.5 cm at liver margin; no pulmonary or bony lesions. She was seen by Dr Laurette Schimke, with recommendation for dose dense taxol  carboplatin, this given x 6 cycles from 02-09-2013 thru 06-16-2013, tolerated generally well without gCSF or any dose reductions. CT CAP 05-09-13 after 4 cycles of chemotherapy found radiographic resolution of supraclavicular adenopathy, no findings of concern in chest, near complete resolution of bulky peritoneal implants and marked improvement in abdominal and pelvic mass. She had two additional cycles of chemotherapy thru 06-16-2013, then interval surgery by Dr [name] on 07-26-13. Surgery was TAH BSO omentectomy and resection of peritoneal nodules, with no gross residual disease in abdomen or pelvis at completion of procedure. Plan is to continue same dose dense chemo for 3 additional cycles post surgery.     Review of systems as above, also: No problems with PAC. No SOB, no pain, no bleeding, no LE swelling, needs ambien x 2 nights after each treatment due to steroids. No symptoms of infection. Bladder ok. Remainder of 10 point Review of Systems negative.  Objective:  Vital signs in last 24 hours:  BP 148/87  Pulse 90  Temp(Src) 98.7 F (37.1 C) (Oral)  Resp 18  Ht 5\' 6"  (1.676 m)  Wt 146 lb 12.8 oz (66.588 kg)  BMI 23.71 kg/m2  Alert, oriented and appropriate. Ambulatory without difficulty.  Alopecia. Looks generally comfortable, very pleasant as always.  HEENT:PERRL, sclerae not icteric. Oral mucosa moist without lesions, posterior pharynx clear.  Neck supple. No JVD.  Lymphatics:no supraclavicular adenopathy palpable and no cervical, axillary or inguinal adenopathy Resp: diminished breath sounds thruout otherwise clear to auscultation bilaterally and normal percussion bilaterally Cardio: regular rate and rhythm. No gallop. GI: soft, nontender, not distended, no mass or organomegaly. Some bowel sounds present. Surgical incision not remarkable.  Musculoskeletal/ Extremities: without pitting edema, cords, tenderness Neuro: no peripheral neuropathy. Otherwise nonfocal. Psych as  above Skin without rash, ecchymosis, petechiae Portacath-without erythema or tenderness  Lab Results:  Results for orders placed in visit on 09/26/13  CBC WITH DIFFERENTIAL      Result Value Range   WBC 4.5  3.9 - 10.3 10e3/uL   NEUT# 2.6  1.5 - 6.5 10e3/uL   HGB 11.5 (*) 11.6 - 15.9 g/dL   HCT 16.1 (*) 09.6 - 04.5 %   Platelets 204  145 - 400 10e3/uL   MCV 93.5  79.5 - 101.0 fL   MCH 31.3  25.1 - 34.0 pg   MCHC 33.5  31.5 - 36.0 g/dL   RBC 4.09 (*) 8.11 - 9.14 10e6/uL   RDW 12.6  11.2 - 14.5 %   lymph# 1.6  0.9 - 3.3 10e3/uL   MONO# 0.3  0.1 - 0.9 10e3/uL   Eosinophils Absolute 0.0  0.0 - 0.5 10e3/uL   Basophils Absolute 0.0  0.0 - 0.1 10e3/uL   NEUT% 57.3  38.4 - 76.8 %   LYMPH% 35.6  14.0 - 49.7 %   MONO% 6.5  0.0 - 14.0 %   EOS% 0.4  0.0 - 7.0 %   BASO% 0.2  0.0 - 2.0 %   nRBC 0  0 - 0 %    CA 125 from 09-07-13 15.9, this having been 1202 in 01-2013. CMET and CA 125 to be drawn from [facility] with treatment on 09-29-13  Studies/Results:  No results found. Plan CT CAP after cycle 9 chemo, shortly prior to Dr Forrestine Him re-evaluation in early Feb. Patient mentions that she is very anxious about having the scan done.  Medications: I have reviewed the patient's current medications. Lexapro refilled. She will hold oral iron due to constipation problems and fact that anemia is very minimal now.   Assessment/Plan: 1.IVB hig grade serous carcinoma left ovarian primary involving left supraclavicular node  by biopsy at presentation 01-2013. Excellent response clinically to 6 cycles of dose dense taxol carboplatin, followed by TAH/ BSO/omentectomy/ resection of peritoneal nodules 07-26-13, now for total 3 additional cycles of same chemotherapy. I will see her during upcoming cycle 9 prior to restaging scans.  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 If she has screening mammograms next, should consider 3D/ tomo  6.mild  anemia: from chemo, multiple blood draws and surgery. Not tolerating oral iron with all of other medications so will hold for now.    Patient is comfortable with discussion and plan.    Leta Bucklin P, MD   09/26/2013, 3:02 PM

## 2013-09-27 ENCOUNTER — Telehealth: Payer: Self-pay | Admitting: *Deleted

## 2013-09-27 ENCOUNTER — Telehealth: Payer: Self-pay | Admitting: Oncology

## 2013-09-27 NOTE — Telephone Encounter (Signed)
Per staff message and POF I have scheduled appts.  [name]  

## 2013-09-29 ENCOUNTER — Other Ambulatory Visit (HOSPITAL_BASED_OUTPATIENT_CLINIC_OR_DEPARTMENT_OTHER): Payer: Commercial Managed Care - PPO

## 2013-09-29 ENCOUNTER — Ambulatory Visit (HOSPITAL_BASED_OUTPATIENT_CLINIC_OR_DEPARTMENT_OTHER): Payer: Commercial Managed Care - PPO

## 2013-09-29 VITALS — BP 163/92 | HR 80 | Temp 97.3°F | Resp 18

## 2013-09-29 DIAGNOSIS — C561 Malignant neoplasm of right ovary: Secondary | ICD-10-CM

## 2013-09-29 DIAGNOSIS — C569 Malignant neoplasm of unspecified ovary: Secondary | ICD-10-CM

## 2013-09-29 DIAGNOSIS — Z5111 Encounter for antineoplastic chemotherapy: Secondary | ICD-10-CM

## 2013-09-29 DIAGNOSIS — C770 Secondary and unspecified malignant neoplasm of lymph nodes of head, face and neck: Secondary | ICD-10-CM

## 2013-09-29 DIAGNOSIS — C779 Secondary and unspecified malignant neoplasm of lymph node, unspecified: Secondary | ICD-10-CM

## 2013-09-29 LAB — COMPREHENSIVE METABOLIC PANEL (CC13)
ALT: 13 U/L (ref 0–55)
AST: 16 U/L (ref 5–34)
Albumin: 3.8 g/dL (ref 3.5–5.0)
BUN: 16.3 mg/dL (ref 7.0–26.0)
Calcium: 9.6 mg/dL (ref 8.4–10.4)
Chloride: 106 mEq/L (ref 98–109)
Creatinine: 0.6 mg/dL (ref 0.6–1.1)
Total Bilirubin: <0.2 mg/dL (ref 0.20–1.20)

## 2013-09-29 LAB — CBC WITH DIFFERENTIAL/PLATELET
BASO%: 0.2 % (ref 0.0–2.0)
Basophils Absolute: 0 10*3/uL (ref 0.0–0.1)
EOS%: 0.4 % (ref 0.0–7.0)
HCT: 32.7 % — ABNORMAL LOW (ref 34.8–46.6)
HGB: 10.8 g/dL — ABNORMAL LOW (ref 11.6–15.9)
MCH: 31.3 pg (ref 25.1–34.0)
MCV: 94.8 fL (ref 79.5–101.0)
MONO%: 8.6 % (ref 0.0–14.0)
NEUT%: 53.6 % (ref 38.4–76.8)
Platelets: 202 10*3/uL (ref 145–400)
lymph#: 1.9 10*3/uL (ref 0.9–3.3)

## 2013-09-29 MED ORDER — FAMOTIDINE IN NACL 20-0.9 MG/50ML-% IV SOLN
INTRAVENOUS | Status: AC
  Filled 2013-09-29: qty 50

## 2013-09-29 MED ORDER — ONDANSETRON 8 MG/NS 50 ML IVPB
INTRAVENOUS | Status: AC
  Filled 2013-09-29: qty 8

## 2013-09-29 MED ORDER — FAMOTIDINE IN NACL 20-0.9 MG/50ML-% IV SOLN
20.0000 mg | Freq: Once | INTRAVENOUS | Status: AC
  Administered 2013-09-29: 20 mg via INTRAVENOUS

## 2013-09-29 MED ORDER — DIPHENHYDRAMINE HCL 50 MG/ML IJ SOLN
50.0000 mg | Freq: Once | INTRAMUSCULAR | Status: AC
  Administered 2013-09-29: 50 mg via INTRAVENOUS

## 2013-09-29 MED ORDER — SODIUM CHLORIDE 0.9 % IV SOLN
Freq: Once | INTRAVENOUS | Status: AC
  Administered 2013-09-29: 11:00:00 via INTRAVENOUS

## 2013-09-29 MED ORDER — SODIUM CHLORIDE 0.9 % IV SOLN
80.0000 mg/m2 | Freq: Once | INTRAVENOUS | Status: AC
  Administered 2013-09-29: 132 mg via INTRAVENOUS
  Filled 2013-09-29: qty 22

## 2013-09-29 MED ORDER — SODIUM CHLORIDE 0.9 % IJ SOLN
10.0000 mL | INTRAMUSCULAR | Status: DC | PRN
  Administered 2013-09-29: 10 mL
  Filled 2013-09-29: qty 10

## 2013-09-29 MED ORDER — DIPHENHYDRAMINE HCL 50 MG/ML IJ SOLN
INTRAMUSCULAR | Status: AC
  Filled 2013-09-29: qty 1

## 2013-09-29 MED ORDER — HEPARIN SOD (PORK) LOCK FLUSH 100 UNIT/ML IV SOLN
500.0000 [IU] | Freq: Once | INTRAVENOUS | Status: AC | PRN
  Administered 2013-09-29: 500 [IU]
  Filled 2013-09-29: qty 5

## 2013-09-29 MED ORDER — ONDANSETRON 8 MG/50ML IVPB (CHCC)
8.0000 mg | Freq: Once | INTRAVENOUS | Status: AC
  Administered 2013-09-29: 8 mg via INTRAVENOUS

## 2013-09-29 MED ORDER — DEXAMETHASONE SODIUM PHOSPHATE 20 MG/5ML IJ SOLN
INTRAMUSCULAR | Status: AC
  Filled 2013-09-29: qty 5

## 2013-09-29 MED ORDER — DEXAMETHASONE SODIUM PHOSPHATE 20 MG/5ML IJ SOLN
20.0000 mg | Freq: Once | INTRAMUSCULAR | Status: AC
  Administered 2013-09-29: 20 mg via INTRAVENOUS

## 2013-09-29 NOTE — Patient Instructions (Signed)
[facility] Discharge Instructions for Patients Receiving Chemotherapy  Today you received the following chemotherapy agent Taxol. To help prevent nausea and vomiting after your treatment, we encourage you to take your nausea medication.   If you develop nausea and vomiting that is not controlled by your nausea medication, call the clinic.   BELOW ARE SYMPTOMS THAT SHOULD BE REPORTED IMMEDIATELY:  *FEVER GREATER THAN 100.5 F  *CHILLS WITH OR WITHOUT FEVER  NAUSEA AND VOMITING THAT IS NOT CONTROLLED WITH YOUR NAUSEA MEDICATION  *UNUSUAL SHORTNESS OF BREATH  *UNUSUAL BRUISING OR BLEEDING  TENDERNESS IN MOUTH AND THROAT WITH OR WITHOUT PRESENCE OF ULCERS  *URINARY PROBLEMS  *BOWEL PROBLEMS  UNUSUAL RASH Items with * indicate a potential emergency and should be followed up as soon as possible.  Feel free to call the clinic you have any questions or concerns. The clinic phone number is [phone].    

## 2013-09-30 ENCOUNTER — Telehealth: Payer: Self-pay

## 2013-09-30 LAB — CA 125: CA 125: 12.7 U/mL (ref 0.0–30.2)

## 2013-09-30 NOTE — Telephone Encounter (Signed)
Told Ms. See Ca-125 results as noted below by Dr. Darrold Span.

## 2013-09-30 NOTE — Telephone Encounter (Signed)
Message copied by Lorine Bears on Fri Sep 30, 2013  2:56 PM ------      Message from: Reece Packer      Created: Fri Sep 30, 2013 11:54 AM       Labs seen and need follow up: please let her know ca 125 good at 12.7 ------

## 2013-10-12 ENCOUNTER — Other Ambulatory Visit (HOSPITAL_BASED_OUTPATIENT_CLINIC_OR_DEPARTMENT_OTHER): Payer: Commercial Managed Care - PPO

## 2013-10-12 ENCOUNTER — Encounter (INDEPENDENT_AMBULATORY_CARE_PROVIDER_SITE_OTHER): Payer: Self-pay

## 2013-10-12 ENCOUNTER — Ambulatory Visit (HOSPITAL_BASED_OUTPATIENT_CLINIC_OR_DEPARTMENT_OTHER): Payer: Commercial Managed Care - PPO

## 2013-10-12 VITALS — BP 131/90 | HR 94 | Temp 98.9°F | Resp 18

## 2013-10-12 DIAGNOSIS — Z5111 Encounter for antineoplastic chemotherapy: Secondary | ICD-10-CM

## 2013-10-12 DIAGNOSIS — C569 Malignant neoplasm of unspecified ovary: Secondary | ICD-10-CM

## 2013-10-12 DIAGNOSIS — C561 Malignant neoplasm of right ovary: Secondary | ICD-10-CM

## 2013-10-12 DIAGNOSIS — C779 Secondary and unspecified malignant neoplasm of lymph node, unspecified: Secondary | ICD-10-CM

## 2013-10-12 LAB — CBC WITH DIFFERENTIAL/PLATELET
Basophils Absolute: 0 10*3/uL (ref 0.0–0.1)
EOS%: 1.2 % (ref 0.0–7.0)
Eosinophils Absolute: 0.1 10*3/uL (ref 0.0–0.5)
HCT: 35.7 % (ref 34.8–46.6)
HGB: 11.7 g/dL (ref 11.6–15.9)
MCH: 31 pg (ref 25.1–34.0)
MCHC: 32.8 g/dL (ref 31.5–36.0)
MCV: 94.7 fL (ref 79.5–101.0)
MONO#: 0.6 10*3/uL (ref 0.1–0.9)
MONO%: 12.2 % (ref 0.0–14.0)
NEUT#: 2.5 10*3/uL (ref 1.5–6.5)
NEUT%: 49.5 % (ref 38.4–76.8)
RDW: 13.8 % (ref 11.2–14.5)
nRBC: 0 % (ref 0–0)

## 2013-10-12 MED ORDER — SODIUM CHLORIDE 0.9 % IJ SOLN
10.0000 mL | INTRAMUSCULAR | Status: DC | PRN
  Administered 2013-10-12: 10 mL
  Filled 2013-10-12: qty 10

## 2013-10-12 MED ORDER — CARBOPLATIN CHEMO INTRADERMAL TEST DOSE 100MCG/0.02ML
100.0000 ug | Freq: Once | INTRADERMAL | Status: AC
  Administered 2013-10-12: 100 ug via INTRADERMAL
  Filled 2013-10-12: qty 0.01

## 2013-10-12 MED ORDER — SODIUM CHLORIDE 0.9 % IV SOLN
80.0000 mg/m2 | Freq: Once | INTRAVENOUS | Status: AC
  Administered 2013-10-12: 132 mg via INTRAVENOUS
  Filled 2013-10-12: qty 22

## 2013-10-12 MED ORDER — DIPHENHYDRAMINE HCL 50 MG/ML IJ SOLN
50.0000 mg | Freq: Once | INTRAMUSCULAR | Status: AC
  Administered 2013-10-12: 50 mg via INTRAVENOUS

## 2013-10-12 MED ORDER — SODIUM CHLORIDE 0.9 % IV SOLN
670.0000 mg | Freq: Once | INTRAVENOUS | Status: AC
  Administered 2013-10-12: 670 mg via INTRAVENOUS
  Filled 2013-10-12: qty 67

## 2013-10-12 MED ORDER — FAMOTIDINE IN NACL 20-0.9 MG/50ML-% IV SOLN
INTRAVENOUS | Status: AC
  Filled 2013-10-12: qty 50

## 2013-10-12 MED ORDER — ONDANSETRON 16 MG/50ML IVPB (CHCC)
INTRAVENOUS | Status: AC
  Filled 2013-10-12: qty 16

## 2013-10-12 MED ORDER — FAMOTIDINE IN NACL 20-0.9 MG/50ML-% IV SOLN
20.0000 mg | Freq: Once | INTRAVENOUS | Status: AC
  Administered 2013-10-12: 20 mg via INTRAVENOUS

## 2013-10-12 MED ORDER — DEXAMETHASONE SODIUM PHOSPHATE 20 MG/5ML IJ SOLN
20.0000 mg | Freq: Once | INTRAMUSCULAR | Status: AC
  Administered 2013-10-12: 20 mg via INTRAVENOUS

## 2013-10-12 MED ORDER — DEXAMETHASONE SODIUM PHOSPHATE 20 MG/5ML IJ SOLN
INTRAMUSCULAR | Status: AC
  Filled 2013-10-12: qty 5

## 2013-10-12 MED ORDER — DIPHENHYDRAMINE HCL 50 MG/ML IJ SOLN
INTRAMUSCULAR | Status: AC
  Filled 2013-10-12: qty 1

## 2013-10-12 MED ORDER — ONDANSETRON 16 MG/50ML IVPB (CHCC)
16.0000 mg | Freq: Once | INTRAVENOUS | Status: AC
  Administered 2013-10-12: 16 mg via INTRAVENOUS

## 2013-10-12 MED ORDER — HEPARIN SOD (PORK) LOCK FLUSH 100 UNIT/ML IV SOLN
500.0000 [IU] | Freq: Once | INTRAVENOUS | Status: AC | PRN
  Administered 2013-10-12: 500 [IU]
  Filled 2013-10-12: qty 5

## 2013-10-12 MED ORDER — SODIUM CHLORIDE 0.9 % IV SOLN
Freq: Once | INTRAVENOUS | Status: AC
  Administered 2013-10-12: 10:00:00 via INTRAVENOUS

## 2013-10-12 NOTE — Patient Instructions (Signed)
[facility] Discharge Instructions for Patients Receiving Chemotherapy  Today you received the following chemotherapy agents Taxol/Carboplatin To help prevent nausea and vomiting after your treatment, we encourage you to take your nausea medication as prescribed.  If you develop nausea and vomiting that is not controlled by your nausea medication, call the clinic.   BELOW ARE SYMPTOMS THAT SHOULD BE REPORTED IMMEDIATELY:  *FEVER GREATER THAN 100.5 F  *CHILLS WITH OR WITHOUT FEVER  NAUSEA AND VOMITING THAT IS NOT CONTROLLED WITH YOUR NAUSEA MEDICATION  *UNUSUAL SHORTNESS OF BREATH  *UNUSUAL BRUISING OR BLEEDING  TENDERNESS IN MOUTH AND THROAT WITH OR WITHOUT PRESENCE OF ULCERS  *URINARY PROBLEMS  *BOWEL PROBLEMS  UNUSUAL RASH Items with * indicate a potential emergency and should be followed up as soon as possible.  Feel free to call the clinic you have any questions or concerns. The clinic phone number is [phone].    

## 2013-10-17 ENCOUNTER — Other Ambulatory Visit: Payer: Self-pay | Admitting: *Deleted

## 2013-10-17 MED ORDER — ONDANSETRON HCL 8 MG PO TABS: 8.0000 mg | ORAL_TABLET | Freq: Four times a day (QID) | ORAL | Status: DC | PRN

## 2013-10-19 ENCOUNTER — Ambulatory Visit (HOSPITAL_BASED_OUTPATIENT_CLINIC_OR_DEPARTMENT_OTHER): Payer: Commercial Managed Care - PPO

## 2013-10-19 ENCOUNTER — Other Ambulatory Visit (HOSPITAL_BASED_OUTPATIENT_CLINIC_OR_DEPARTMENT_OTHER): Payer: Commercial Managed Care - PPO

## 2013-10-19 VITALS — BP 150/68 | HR 89 | Temp 97.2°F | Resp 18

## 2013-10-19 DIAGNOSIS — C569 Malignant neoplasm of unspecified ovary: Secondary | ICD-10-CM

## 2013-10-19 DIAGNOSIS — Z5111 Encounter for antineoplastic chemotherapy: Secondary | ICD-10-CM

## 2013-10-19 DIAGNOSIS — C770 Secondary and unspecified malignant neoplasm of lymph nodes of head, face and neck: Secondary | ICD-10-CM

## 2013-10-19 DIAGNOSIS — C779 Secondary and unspecified malignant neoplasm of lymph node, unspecified: Secondary | ICD-10-CM

## 2013-10-19 DIAGNOSIS — C561 Malignant neoplasm of right ovary: Secondary | ICD-10-CM

## 2013-10-19 LAB — CBC WITH DIFFERENTIAL/PLATELET
BASO%: 0.4 % (ref 0.0–2.0)
BASOS ABS: 0 10*3/uL (ref 0.0–0.1)
EOS ABS: 0.1 10*3/uL (ref 0.0–0.5)
EOS%: 1.2 % (ref 0.0–7.0)
HCT: 34.8 % (ref 34.8–46.6)
HEMOGLOBIN: 11.7 g/dL (ref 11.6–15.9)
LYMPH%: 36.2 % (ref 14.0–49.7)
MCH: 31.4 pg (ref 25.1–34.0)
MCHC: 33.6 g/dL (ref 31.5–36.0)
MCV: 93.3 fL (ref 79.5–101.0)
MONO#: 0.4 10*3/uL (ref 0.1–0.9)
MONO%: 7.2 % (ref 0.0–14.0)
NEUT%: 55 % (ref 38.4–76.8)
NEUTROS ABS: 2.7 10*3/uL (ref 1.5–6.5)
Platelets: 248 10*3/uL (ref 145–400)
RBC: 3.73 10*6/uL (ref 3.70–5.45)
RDW: 13.5 % (ref 11.2–14.5)
WBC: 4.9 10*3/uL (ref 3.9–10.3)
lymph#: 1.8 10*3/uL (ref 0.9–3.3)

## 2013-10-19 MED ORDER — HEPARIN SOD (PORK) LOCK FLUSH 100 UNIT/ML IV SOLN
500.0000 [IU] | Freq: Once | INTRAVENOUS | Status: AC | PRN
  Administered 2013-10-19: 500 [IU]
  Filled 2013-10-19: qty 5

## 2013-10-19 MED ORDER — DIPHENHYDRAMINE HCL 50 MG/ML IJ SOLN
INTRAMUSCULAR | Status: AC
  Filled 2013-10-19: qty 1

## 2013-10-19 MED ORDER — DIPHENHYDRAMINE HCL 50 MG/ML IJ SOLN
50.0000 mg | Freq: Once | INTRAMUSCULAR | Status: AC
  Administered 2013-10-19: 50 mg via INTRAVENOUS

## 2013-10-19 MED ORDER — DEXAMETHASONE SODIUM PHOSPHATE 20 MG/5ML IJ SOLN
INTRAMUSCULAR | Status: AC
  Filled 2013-10-19: qty 5

## 2013-10-19 MED ORDER — SODIUM CHLORIDE 0.9 % IV SOLN
80.0000 mg/m2 | Freq: Once | INTRAVENOUS | Status: AC
  Administered 2013-10-19: 132 mg via INTRAVENOUS
  Filled 2013-10-19: qty 22

## 2013-10-19 MED ORDER — DEXAMETHASONE SODIUM PHOSPHATE 20 MG/5ML IJ SOLN
20.0000 mg | Freq: Once | INTRAMUSCULAR | Status: AC
  Administered 2013-10-19: 20 mg via INTRAVENOUS

## 2013-10-19 MED ORDER — ONDANSETRON 8 MG/NS 50 ML IVPB
INTRAVENOUS | Status: AC
  Filled 2013-10-19: qty 8

## 2013-10-19 MED ORDER — SODIUM CHLORIDE 0.9 % IV SOLN
Freq: Once | INTRAVENOUS | Status: AC
  Administered 2013-10-19: 13:00:00 via INTRAVENOUS

## 2013-10-19 MED ORDER — ONDANSETRON 8 MG/50ML IVPB (CHCC)
8.0000 mg | Freq: Once | INTRAVENOUS | Status: AC
  Administered 2013-10-19: 8 mg via INTRAVENOUS

## 2013-10-19 MED ORDER — FAMOTIDINE IN NACL 20-0.9 MG/50ML-% IV SOLN
20.0000 mg | Freq: Once | INTRAVENOUS | Status: AC
  Administered 2013-10-19: 20 mg via INTRAVENOUS

## 2013-10-19 MED ORDER — FAMOTIDINE IN NACL 20-0.9 MG/50ML-% IV SOLN
INTRAVENOUS | Status: AC
  Filled 2013-10-19: qty 50

## 2013-10-19 MED ORDER — SODIUM CHLORIDE 0.9 % IJ SOLN
10.0000 mL | INTRAMUSCULAR | Status: DC | PRN
Start: 2013-10-19 — End: 2013-10-19
  Administered 2013-10-19: 10 mL
  Filled 2013-10-19: qty 10

## 2013-10-19 NOTE — Patient Instructions (Signed)
[facility] Discharge Instructions for Patients Receiving Chemotherapy  Today you received the following chemotherapy agents: Taxol.  To help prevent nausea and vomiting after your treatment, we encourage you to take your nausea medication as prescribed.   If you develop nausea and vomiting that is not controlled by your nausea medication, call the clinic.   BELOW ARE SYMPTOMS THAT SHOULD BE REPORTED IMMEDIATELY:  *FEVER GREATER THAN 100.5 F  *CHILLS WITH OR WITHOUT FEVER  NAUSEA AND VOMITING THAT IS NOT CONTROLLED WITH YOUR NAUSEA MEDICATION  *UNUSUAL SHORTNESS OF BREATH  *UNUSUAL BRUISING OR BLEEDING  TENDERNESS IN MOUTH AND THROAT WITH OR WITHOUT PRESENCE OF ULCERS  *URINARY PROBLEMS  *BOWEL PROBLEMS  UNUSUAL RASH Items with * indicate a potential emergency and should be followed up as soon as possible.  Feel free to call the clinic you have any questions or concerns. The clinic phone number is [phone].    

## 2013-10-23 ENCOUNTER — Other Ambulatory Visit: Payer: Self-pay | Admitting: Oncology

## 2013-10-23 ENCOUNTER — Encounter: Payer: Self-pay | Admitting: Oncology

## 2013-10-26 ENCOUNTER — Encounter: Payer: Self-pay | Admitting: Oncology

## 2013-10-26 ENCOUNTER — Telehealth: Payer: Self-pay | Admitting: *Deleted

## 2013-10-26 ENCOUNTER — Ambulatory Visit (HOSPITAL_BASED_OUTPATIENT_CLINIC_OR_DEPARTMENT_OTHER): Payer: Commercial Managed Care - PPO | Admitting: Oncology

## 2013-10-26 ENCOUNTER — Ambulatory Visit (HOSPITAL_BASED_OUTPATIENT_CLINIC_OR_DEPARTMENT_OTHER): Payer: Commercial Managed Care - PPO

## 2013-10-26 ENCOUNTER — Other Ambulatory Visit (HOSPITAL_BASED_OUTPATIENT_CLINIC_OR_DEPARTMENT_OTHER): Payer: Commercial Managed Care - PPO

## 2013-10-26 VITALS — BP 155/87 | HR 95 | Temp 99.3°F | Resp 18 | Ht 66.0 in | Wt 151.1 lb

## 2013-10-26 DIAGNOSIS — Z5111 Encounter for antineoplastic chemotherapy: Secondary | ICD-10-CM

## 2013-10-26 DIAGNOSIS — C770 Secondary and unspecified malignant neoplasm of lymph nodes of head, face and neck: Secondary | ICD-10-CM

## 2013-10-26 DIAGNOSIS — D72819 Decreased white blood cell count, unspecified: Secondary | ICD-10-CM

## 2013-10-26 DIAGNOSIS — C569 Malignant neoplasm of unspecified ovary: Secondary | ICD-10-CM

## 2013-10-26 DIAGNOSIS — D6481 Anemia due to antineoplastic chemotherapy: Secondary | ICD-10-CM

## 2013-10-26 DIAGNOSIS — T451X5A Adverse effect of antineoplastic and immunosuppressive drugs, initial encounter: Secondary | ICD-10-CM

## 2013-10-26 DIAGNOSIS — C561 Malignant neoplasm of right ovary: Secondary | ICD-10-CM

## 2013-10-26 DIAGNOSIS — C779 Secondary and unspecified malignant neoplasm of lymph node, unspecified: Secondary | ICD-10-CM

## 2013-10-26 LAB — COMPREHENSIVE METABOLIC PANEL (CC13)
ALT: 14 U/L (ref 0–55)
ANION GAP: 9 meq/L (ref 3–11)
AST: 14 U/L (ref 5–34)
Albumin: 3.8 g/dL (ref 3.5–5.0)
Alkaline Phosphatase: 82 U/L (ref 40–150)
BILIRUBIN TOTAL: 0.23 mg/dL (ref 0.20–1.20)
BUN: 21.1 mg/dL (ref 7.0–26.0)
CO2: 25 meq/L (ref 22–29)
Calcium: 9.2 mg/dL (ref 8.4–10.4)
Chloride: 105 mEq/L (ref 98–109)
Creatinine: 0.7 mg/dL (ref 0.6–1.1)
GLUCOSE: 87 mg/dL (ref 70–140)
Potassium: 4.2 mEq/L (ref 3.5–5.1)
SODIUM: 139 meq/L (ref 136–145)
TOTAL PROTEIN: 6.3 g/dL — AB (ref 6.4–8.3)

## 2013-10-26 LAB — CBC WITH DIFFERENTIAL/PLATELET
BASO%: 0.3 % (ref 0.0–2.0)
BASOS ABS: 0 10*3/uL (ref 0.0–0.1)
EOS ABS: 0 10*3/uL (ref 0.0–0.5)
EOS%: 0.8 % (ref 0.0–7.0)
HEMATOCRIT: 32.4 % — AB (ref 34.8–46.6)
HEMOGLOBIN: 10.9 g/dL — AB (ref 11.6–15.9)
LYMPH%: 44.4 % (ref 14.0–49.7)
MCH: 31.4 pg (ref 25.1–34.0)
MCHC: 33.6 g/dL (ref 31.5–36.0)
MCV: 93.4 fL (ref 79.5–101.0)
MONO#: 0.4 10*3/uL (ref 0.1–0.9)
MONO%: 9.7 % (ref 0.0–14.0)
NEUT%: 44.8 % (ref 38.4–76.8)
NEUTROS ABS: 1.7 10*3/uL (ref 1.5–6.5)
Platelets: 179 10*3/uL (ref 145–400)
RBC: 3.47 10*6/uL — ABNORMAL LOW (ref 3.70–5.45)
RDW: 13.9 % (ref 11.2–14.5)
WBC: 3.8 10*3/uL — ABNORMAL LOW (ref 3.9–10.3)
lymph#: 1.7 10*3/uL (ref 0.9–3.3)
nRBC: 0 % (ref 0–0)

## 2013-10-26 MED ORDER — SODIUM CHLORIDE 0.9 % IV SOLN
Freq: Once | INTRAVENOUS | Status: AC
  Administered 2013-10-26: 10:00:00 via INTRAVENOUS

## 2013-10-26 MED ORDER — HEPARIN SOD (PORK) LOCK FLUSH 100 UNIT/ML IV SOLN
500.0000 [IU] | Freq: Once | INTRAVENOUS | Status: AC | PRN
  Administered 2013-10-26: 500 [IU]
  Filled 2013-10-26: qty 5

## 2013-10-26 MED ORDER — ONDANSETRON 8 MG/NS 50 ML IVPB
INTRAVENOUS | Status: AC
Start: 2013-10-26 — End: 2013-10-26
  Filled 2013-10-26: qty 8

## 2013-10-26 MED ORDER — DIPHENHYDRAMINE HCL 50 MG/ML IJ SOLN
INTRAMUSCULAR | Status: AC
  Filled 2013-10-26: qty 1

## 2013-10-26 MED ORDER — DEXAMETHASONE SODIUM PHOSPHATE 20 MG/5ML IJ SOLN
20.0000 mg | Freq: Once | INTRAMUSCULAR | Status: AC
  Administered 2013-10-26: 20 mg via INTRAVENOUS

## 2013-10-26 MED ORDER — DEXAMETHASONE SODIUM PHOSPHATE 20 MG/5ML IJ SOLN
INTRAMUSCULAR | Status: AC
  Filled 2013-10-26: qty 5

## 2013-10-26 MED ORDER — DIPHENHYDRAMINE HCL 50 MG/ML IJ SOLN
50.0000 mg | Freq: Once | INTRAMUSCULAR | Status: AC
  Administered 2013-10-26: 50 mg via INTRAVENOUS

## 2013-10-26 MED ORDER — ONDANSETRON 8 MG/50ML IVPB (CHCC)
8.0000 mg | Freq: Once | INTRAVENOUS | Status: AC
  Administered 2013-10-26: 8 mg via INTRAVENOUS

## 2013-10-26 MED ORDER — PACLITAXEL CHEMO INJECTION 300 MG/50ML
80.0000 mg/m2 | Freq: Once | INTRAVENOUS | Status: AC
  Administered 2013-10-26: 132 mg via INTRAVENOUS
  Filled 2013-10-26: qty 22

## 2013-10-26 MED ORDER — FAMOTIDINE IN NACL 20-0.9 MG/50ML-% IV SOLN
20.0000 mg | Freq: Once | INTRAVENOUS | Status: AC
  Administered 2013-10-26: 20 mg via INTRAVENOUS

## 2013-10-26 MED ORDER — FAMOTIDINE IN NACL 20-0.9 MG/50ML-% IV SOLN
INTRAVENOUS | Status: AC
  Filled 2013-10-26: qty 50

## 2013-10-26 MED ORDER — SODIUM CHLORIDE 0.9 % IJ SOLN
10.0000 mL | INTRAMUSCULAR | Status: DC | PRN
  Administered 2013-10-26: 10 mL
  Filled 2013-10-26: qty 10

## 2013-10-26 NOTE — Telephone Encounter (Signed)
appts made and printed. Pt is aware that cs will call w/ appt for CT CHEST/ABD/PELVIS...Marland KitchenMarland KitchenTD

## 2013-10-26 NOTE — Patient Instructions (Signed)
[facility] Discharge Instructions for Patients Receiving Chemotherapy  Today you received the following chemotherapy agents: Taxol.  To help prevent nausea and vomiting after your treatment, we encourage you to take your nausea medication as prescribed.   If you develop nausea and vomiting that is not controlled by your nausea medication, call the clinic.   BELOW ARE SYMPTOMS THAT SHOULD BE REPORTED IMMEDIATELY:  *FEVER GREATER THAN 100.5 F  *CHILLS WITH OR WITHOUT FEVER  NAUSEA AND VOMITING THAT IS NOT CONTROLLED WITH YOUR NAUSEA MEDICATION  *UNUSUAL SHORTNESS OF BREATH  *UNUSUAL BRUISING OR BLEEDING  TENDERNESS IN MOUTH AND THROAT WITH OR WITHOUT PRESENCE OF ULCERS  *URINARY PROBLEMS  *BOWEL PROBLEMS  UNUSUAL RASH Items with * indicate a potential emergency and should be followed up as soon as possible.  Feel free to call the clinic you have any questions or concerns. The clinic phone number is [phone].    

## 2013-10-26 NOTE — Progress Notes (Signed)
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 She is for day 15 cycle 9 dose dense carbo taxol today (10-26-13), then will have repeat CT CAP shortly prior to seeing Dr Skeet Latch on 11-24-13. She has tolerated all of the treatment generally well, with primary side effect being fatigue; she has also gained weight related to steroids. She has had no significant peripheral neuropathy. She has PAC in.   ONCOLOGIC HISTORY Patient saw Dr Juanda Crumble Lomax/ NP Eve regularly for years and was up to date on gyn exams at the time of this diagnosis. She presented with several months of slowly enlarging left supraclavicular adenopathy, measuring ~ 2 cm at time of biopsy by Dr Adrian Saran 01-22-12. Pathology [phone]) showed metastatic adenocarcinoma with immunohistochemical profile consistent with gyn vs breast primary, including ER PR +. CA 125 was 1202. Breast imaging 01-28-13 at [facility] (bilateral diagnostic mammograms and [place]) had no findings of concern. PET/CT 02-03-13 in [facility] system found bilateral hypermetabolic supraclavicular and mediastinal adenopathy; 8.7 x 8.7 hypermetabolic cystic and solid pelvic mass with adjacent similar mass in right iliac fossa; multiple hypermetabolic peritoneal implants including area 5.5 x 3.5 cm at liver margin; no pulmonary or bony lesions. She was seen by Dr Janie Morning, with recommendation for dose dense taxol carboplatin, this given x 6 cycles from 02-09-2013 thru 06-16-2013, tolerated generally well without gCSF or any dose reductions. CT CAP 05-09-13 after 4 cycles of chemotherapy found radiographic resolution of supraclavicular adenopathy, no findings of concern in chest, near complete resolution of bulky  peritoneal implants and marked improvement in abdominal and pelvic mass. She had two additional cycles of chemotherapy thru 06-16-2013, then interval surgery by Dr Skeet Latch on 07-26-13. Surgery was TAH BSO omentectomy and resection of peritoneal nodules, with no gross residual disease in abdomen or pelvis at completion of procedure, then same dose dense chemo for 3 additional cycles post surgery, completed 10-26-2013.    Review of systems as above, also: No fever or symptoms of infection. No pain. No bleeding. No problems with PAC. Bowels ok. No cough or increased SOB. No LE swelling. Bladder ok.  Remainder of 10 point Review of Systems negative.  Objective:  Vital signs in last 24 hours:  BP 155/87  Pulse 95  Temp(Src) 99.3 F (37.4 C) (Oral)  Resp 18  Ht 5\' 6"  (1.676 m)  Wt 151 lb 1.6 oz (68.539 kg)  BMI 24.40 kg/m2  SpO2 99% Weight had been 129 in [place] 2014.  Alert, oriented and appropriate. Ambulatory without difficulty.  Alopecia  HEENT:PERRL, sclerae not icteric. Oral mucosa moist without lesions, posterior pharynx clear.  Neck supple. No JVD.  Lymphatics:no cervical,suraclavicular, axillary or inguinal adenopathy Resp:  Somewhat diminished breath sounds as baseline, otherwise clear to auscultation bilaterally and normal percussion bilaterally Cardio: regular rate and rhythm. No gallop. GI: soft, nontender, not distended, no mass or organomegaly. Normally active bowel sounds. Surgical incision not remarkable. Musculoskeletal/ Extremities: without pitting edema, cords, tenderness Neuro: no peripheral neuropathy. Otherwise nonfocal Skin without rash, ecchymosis, petechiae Breasts: without dominant mass, skin or nipple findings. Axillae benign. Portacath-without erythema or tenderness  Lab Results:  Results for orders placed in visit on 10/26/13  CBC WITH DIFFERENTIAL      Result Value Range   WBC 3.8 (*)  3.9 - 10.3 10e3/uL   NEUT# 1.7  1.5 - 6.5 10e3/uL   HGB 10.9 (*)  11.6 - 15.9 g/dL   HCT 32.4 (*) 34.8 - 46.6 %   Platelets 179  145 - 400 10e3/uL   MCV 93.4  79.5 - 101.0 fL   MCH 31.4  25.1 - 34.0 pg   MCHC 33.6  31.5 - 36.0 g/dL   RBC 3.47 (*) 3.70 - 5.45 10e6/uL   RDW 13.9  11.2 - 14.5 %   lymph# 1.7  0.9 - 3.3 10e3/uL   MONO# 0.4  0.1 - 0.9 10e3/uL   Eosinophils Absolute 0.0  0.0 - 0.5 10e3/uL   Basophils Absolute 0.0  0.0 - 0.1 10e3/uL   NEUT% 44.8  38.4 - 76.8 %   LYMPH% 44.4  14.0 - 49.7 %   MONO% 9.7  0.0 - 14.0 %   EOS% 0.8  0.0 - 7.0 %   BASO% 0.3  0.0 - 2.0 %   nRBC 0  0 - 0 %  COMPREHENSIVE METABOLIC PANEL (PP29)      Result Value Range   Sodium 139  136 - 145 mEq/L   Potassium 4.2  3.5 - 5.1 mEq/L   Chloride 105  98 - 109 mEq/L   CO2 25  22 - 29 mEq/L   Glucose 87  70 - 140 mg/dl   BUN 21.1  7.0 - 26.0 mg/dL   Creatinine 0.7  0.6 - 1.1 mg/dL   Total Bilirubin 0.23  0.20 - 1.20 mg/dL   Alkaline Phosphatase 82  40 - 150 U/L   AST 14  5 - 34 U/L   ALT 14  0 - 55 U/L   Total Protein 6.3 (*) 6.4 - 8.3 g/dL   Albumin 3.8  3.5 - 5.0 g/dL   Calcium 9.2  8.4 - 10.4 mg/dL   Anion Gap 9  3 - 11 mEq/L    ca125 available after visit 12, this having been 13.7 in Sept and 1202 in April 2014.  Counts are still just adequate for single agent taxol as planned today.  Studies/Results:  No results found. Last CT CAP was in [facility] 05-09-2013  She will be set up for CT CAP shortly prior to seeing Dr Skeet Latch on 11-24-13.  Medications: I have reviewed the patient's current medications. She has had flu vaccine; we have discussed use of tamiflu if symptoms of influenza, due to incomplete coverage from vaccine this year.  DISCUSSION: we discussed timing of CT (just prior to Dr Leone Brand appointment, as patient very anxious when she has to wait for results) and fact that she N[place] need additional treatment in future particularly due to extent of disease at diagnosis, with close follow up appropriate even if hopefully she can go onto treatment  break now. We have discussed keeping PAC in case it is needed again, with flushes every 6-8 weeks when not otherwise used. PAC should be used and flushed for CT ~ 11-23-13.  With her immune system compromised due to all of recent chemotherapy, and with extent of influenza and severe respiratory infections in community, it is medically necessary that she remain out of work until blood counts have recovered. I have given her a note for her work indicating that she will need to be out of work thru 11-24-13 visit with Dr Skeet Latch. We will repeat CBC that day.     Assessment/Plan:  1.IVB hig grade serous carcinoma left ovarian primary involving left supraclavicular node  by biopsy at presentation 01-2013. Excellent response clinically to 6 cycles of dose dense taxol carboplatin, followed by TAH/ BSO/omentectomy/ resection of peritoneal nodules 07-26-13, to complete 3 additional cycles with day 15 cycle 9 today. She will have repeat CT CAP 1-2 days prior to seeing Dr Skeet Latch on 11-24-13.  She had visit with genetics counselor in 03-2013, but did not proceed with testing then that I am aware of. She did not have radiation to left supraclavicular area. 2.PAC in: she is in agreement with keeping this, as it does not bother her at all. Will keep flushed here every 6-8 weeks when not otherwise used. 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 Stopping smoking Shiroma also have contributed to some weight gain 4.Flu vaccine done  5.dense breast tissue on mammograms. If screening mammograms next, should do as 3D/ tomo. Last mammograms were [facility] 01-28-2013 during work up for this cancer prior to identifying primary. 6.mild anemia: from chemo, multiple blood draws and surgery. Not tolerating oral iron with all of other medications so will hold for now.  7.leukopenia related to chemotherapy: see above. Will repeat CBC when she is at this office for gyn onc  visit next.   Patient and husband have had questions answered to their satisfaction and are in agreement with plan above. I will see her at least when due next PAC flush after Dr Leone Brand visit, with CBC,CMET,CA 125 from [facility] then. I can certainly see her prior if needed.  Deborah Macdonald,Deborah P, MD   10/26/2013, 1:25 PM

## 2013-10-26 NOTE — Patient Instructions (Signed)
Portacath needs to be flushed every 6-8 weeks when not used otherwise. Radiology should use it for your CT just before Dr Leone Brand visit  Call if flu symptoms, for Tamiflu if so

## 2013-10-27 ENCOUNTER — Encounter: Payer: Self-pay | Admitting: Oncology

## 2013-10-27 ENCOUNTER — Telehealth: Payer: Self-pay | Admitting: *Deleted

## 2013-10-27 LAB — CA 125: CA 125: 12 U/mL (ref 0.0–30.2)

## 2013-10-27 NOTE — Progress Notes (Signed)
[facility] END OF TREATMENT   Name: Deborah Macdonald Date: 10/27/2013 MRN: [id number] DOB: [birth year]   TREATMENT DATES: 02-09-2013 thru 10-26-2013   REFERRING PHYSICIAN: Janie Morning, MD  DIAGNOSIS:  high grade serous carcinoma of left ovary   STAGE AT START OF TREATMENT: IVB, involving left supraclavicular nodes   INTENT: control   DRUGS OR REGIMENS GIVEN: dose dense taxol carboplatin x 9 cycles, with interval debulking surgery   MAJOR TOXICITIES: fatigue, cytopenias   REASON TREATMENT STOPPED: completion of planned course   PERFORMANCE STATUS AT END: 1   ONGOING PROBLEMS: fatigue, anemia, leukopenia, weight gain related to steroids   FOLLOW UP PLANS: Repeat CT chest, abdomen, pelvis and reevaluation by gyn oncology 11-2013. Follow up labs 11-2013

## 2013-10-27 NOTE — Telephone Encounter (Signed)
Pt notified of results below. Confirmed CT appointment.

## 2013-10-27 NOTE — Telephone Encounter (Signed)
Message copied by Patton Salles on Thu Oct 27, 2013  9:10 AM ------      Message from: Evlyn Clines P      Created: Thu Oct 27, 2013  7:53 AM       Labs seen and need follow up: please let her know ca 125 still very good at 12      Cc LA, TH ------

## 2013-11-23 ENCOUNTER — Ambulatory Visit (HOSPITAL_COMMUNITY)
Admission: RE | Admit: 2013-11-23 | Discharge: 2013-11-23 | Disposition: A | Discharge status: Home / Self Care | Payer: Commercial Managed Care - PPO | Source: Ambulatory Visit | Attending: Oncology | Admitting: Oncology

## 2013-11-23 DIAGNOSIS — M5137 Other intervertebral disc degeneration, lumbosacral region: Secondary | ICD-10-CM | POA: Insufficient documentation

## 2013-11-23 DIAGNOSIS — Z9221 Personal history of antineoplastic chemotherapy: Secondary | ICD-10-CM | POA: Insufficient documentation

## 2013-11-23 DIAGNOSIS — J438 Other emphysema: Secondary | ICD-10-CM | POA: Insufficient documentation

## 2013-11-23 DIAGNOSIS — M51379 Other intervertebral disc degeneration, lumbosacral region without mention of lumbar back pain or lower extremity pain: Secondary | ICD-10-CM | POA: Insufficient documentation

## 2013-11-23 DIAGNOSIS — N281 Cyst of kidney, acquired: Secondary | ICD-10-CM | POA: Insufficient documentation

## 2013-11-23 DIAGNOSIS — C569 Malignant neoplasm of unspecified ovary: Secondary | ICD-10-CM | POA: Insufficient documentation

## 2013-11-23 DIAGNOSIS — Z90710 Acquired absence of both cervix and uterus: Secondary | ICD-10-CM | POA: Insufficient documentation

## 2013-11-23 DIAGNOSIS — K8020 Calculus of gallbladder without cholecystitis without obstruction: Secondary | ICD-10-CM | POA: Insufficient documentation

## 2013-11-23 DIAGNOSIS — C561 Malignant neoplasm of right ovary: Secondary | ICD-10-CM

## 2013-11-23 DIAGNOSIS — I700 Atherosclerosis of aorta: Secondary | ICD-10-CM | POA: Insufficient documentation

## 2013-11-23 MED ORDER — IOHEXOL 300 MG/ML  SOLN
100.0000 mL | Freq: Once | INTRAMUSCULAR | Status: AC | PRN
  Administered 2013-11-23: 100 mL via INTRAVENOUS

## 2013-11-24 ENCOUNTER — Other Ambulatory Visit (HOSPITAL_BASED_OUTPATIENT_CLINIC_OR_DEPARTMENT_OTHER): Payer: Commercial Managed Care - PPO

## 2013-11-24 ENCOUNTER — Encounter: Payer: Self-pay | Admitting: Gynecologic Oncology

## 2013-11-24 ENCOUNTER — Ambulatory Visit: Payer: Commercial Managed Care - PPO | Attending: Gynecologic Oncology | Admitting: Gynecologic Oncology

## 2013-11-24 VITALS — BP 129/81 | HR 84 | Temp 97.8°F | Resp 16 | Ht 66.0 in | Wt 152.7 lb

## 2013-11-24 DIAGNOSIS — Z9079 Acquired absence of other genital organ(s): Secondary | ICD-10-CM | POA: Insufficient documentation

## 2013-11-24 DIAGNOSIS — I10 Essential (primary) hypertension: Secondary | ICD-10-CM | POA: Insufficient documentation

## 2013-11-24 DIAGNOSIS — C786 Secondary malignant neoplasm of retroperitoneum and peritoneum: Secondary | ICD-10-CM | POA: Insufficient documentation

## 2013-11-24 DIAGNOSIS — C569 Malignant neoplasm of unspecified ovary: Secondary | ICD-10-CM

## 2013-11-24 DIAGNOSIS — Z90710 Acquired absence of both cervix and uterus: Secondary | ICD-10-CM | POA: Insufficient documentation

## 2013-11-24 DIAGNOSIS — C770 Secondary and unspecified malignant neoplasm of lymph nodes of head, face and neck: Secondary | ICD-10-CM | POA: Insufficient documentation

## 2013-11-24 DIAGNOSIS — Z9221 Personal history of antineoplastic chemotherapy: Secondary | ICD-10-CM | POA: Insufficient documentation

## 2013-11-24 DIAGNOSIS — C561 Malignant neoplasm of right ovary: Secondary | ICD-10-CM

## 2013-11-24 LAB — CBC WITH DIFFERENTIAL/PLATELET
BASO%: 0.8 % (ref 0.0–2.0)
BASOS ABS: 0.1 10*3/uL (ref 0.0–0.1)
EOS ABS: 0.2 10*3/uL (ref 0.0–0.5)
EOS%: 2.4 % (ref 0.0–7.0)
HCT: 33 % — ABNORMAL LOW (ref 34.8–46.6)
HEMOGLOBIN: 11.3 g/dL — AB (ref 11.6–15.9)
LYMPH%: 29 % (ref 14.0–49.7)
MCH: 33.3 pg (ref 25.1–34.0)
MCHC: 34.2 g/dL (ref 31.5–36.0)
MCV: 97.3 fL (ref 79.5–101.0)
MONO#: 0.8 10*3/uL (ref 0.1–0.9)
MONO%: 11.9 % (ref 0.0–14.0)
NEUT%: 55.9 % (ref 38.4–76.8)
NEUTROS ABS: 3.6 10*3/uL (ref 1.5–6.5)
PLATELETS: 232 10*3/uL (ref 145–400)
RBC: 3.39 10*6/uL — ABNORMAL LOW (ref 3.70–5.45)
RDW: 15.2 % — ABNORMAL HIGH (ref 11.2–14.5)
WBC: 6.4 10*3/uL (ref 3.9–10.3)
lymph#: 1.8 10*3/uL (ref 0.9–3.3)

## 2013-11-24 NOTE — Patient Instructions (Signed)
Follow-up in 7 months`   Cholelithiasis Cholelithiasis (also called gallstones) is a form of gallbladder disease in which gallstones form in your gallbladder. The gallbladder is an organ that stores bile made in the liver, which helps digest fats. Gallstones begin as small crystals and slowly grow into stones. Gallstone pain occurs when the gallbladder spasms and a gallstone is blocking the duct. Pain can also occur when a stone passes out of the duct.  RISK FACTORS  Being female.   Having multiple pregnancies. Health care providers sometimes advise removing diseased gallbladders before future pregnancies.   Being obese.  Eating a diet heavy in fried foods and fat.   Being older than 90 years and increasing age.   Prolonged use of medicines containing female hormones.   Having diabetes mellitus.   Rapidly losing weight.   Having a family history of gallstones (heredity).  SYMPTOMS  Nausea.   Vomiting.  Abdominal pain.   Yellowing of the skin (jaundice).   Sudden pain. It [place] persist from several minutes to several hours.  Fever.   Tenderness to the touch. In some cases, when gallstones do not move into the bile duct, people have no pain or symptoms. These are called "silent" gallstones.  TREATMENT Silent gallstones do not need treatment. In severe cases, emergency surgery Welcome be required. Options for treatment include:  Surgery to remove the gallbladder. This is the most common treatment.  Medicines. These do not always work and Sigmund take 6 12 months or more to work.  Shock wave treatment (extracorporeal biliary lithotripsy). In this treatment an ultrasound machine sends shock waves to the gallbladder to break gallstones into smaller pieces that can pass into the intestines or be dissolved by medicine. HOME CARE INSTRUCTIONS   Only take over-the-counter or prescription medicines for pain, discomfort, or fever as directed by your health care provider.    Follow a low-fat diet until seen again by your health care provider. Fat causes the gallbladder to contract, which can result in pain.   Follow up with your health care provider as directed. Attacks are almost always recurrent and surgery is usually required for permanent treatment.  SEEK IMMEDIATE MEDICAL CARE IF:   Your pain increases and is not controlled by medicines.   You have a fever or persistent symptoms for more than 2 3 days.   You have a fever and your symptoms suddenly get worse.   You have persistent nausea and vomiting.  MAKE SURE YOU:   Understand these instructions.  Will watch your condition.  Will get help right away if you are not doing well or get worse. Document Released: 09/25/2005 Document Revised: 06/01/2013 Document Reviewed: 03/23/2013 [facility] Patient Information 2014 [place].

## 2013-11-24 NOTE — Progress Notes (Signed)
GYN ONC Note.  Tenya Araque [place] 58 y.o. female  CC:  Ovarian cancer surveillance    Assessment/Plan:  Sarra Menzer is a. 58 year old with stage IV B. ovarian carcinoma. The initial presentation was that of extra-abdominal lymphadenopathy, metastatic disease within the supraclavicular lymph nodes, and CA 125 elevated to 1202.4. She received 6 cycles of dose dense neoadjuvant Taxol platinum therapy.  On 07/27/2003 she she underwent total abdominal hysterectomy bilateral salpingo-oophorectomy and omentectomy with excision of peritoneal metastases. No gross residual disease was present at the completion of surgery. Final pathology is notable for serous carcinoma within the peritoneal nodule and the left ovary and uterine serosa. 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    Ms. Shel[place]ne received an additional 3 cycles of dose dense Taxol carboplatin therapy.   CT scan of the abdomen and pelvis  Followup with Dr. Marko Plume IN 3 months Followup with GYN oncology in 6 months  Counseled on the signs and symptoms of recurrence.  HPI:  Aizza Shontz is a 58 y.o.  gravida 3 para 3, who was noted to have left cervical adenopathy in September 2013. She noted that it was a slowly increasing in size and the initial workup was for evaluation for lung disease. She underwent excision of supraclavicular LN on 01/21/2013 The malignant cells are positive for cytokeratin 7, estrogen receptor, progesterone receptor, and focally for p53. They are negative for cytokeratin 20, GCDFP, Napsin-A, , thyroglobulin, and TTF-1. This immunohistochemical profile raises the possibility of metastatic gynecologic carcinoma or metastatic breast carcinoma. The former is favored. 01/27/13 PET IMPRESSION: 1. 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 2. Bulky peritoneal metastasis in the  upper abdomen and along the capsule of the liver. 3. Hypermetabolic retroperitoneal nodal metastasis. 4. Small hypermetabolic mediastinal nodal metastasis. 5. Supraclavicular hypermetabolic nodal metastasis.    She completed six cycles of carbo/taxol, last on 06/16/13.  CT chest/abdomen/pelvis on 05/09/13 prior to completion of chemotherapy noted  1. Near complete resolution of the bulky the peritoneal implants. 2. Marked reduction in size of periaortic adenopathy with lymph nodes now less than 5 mm. 3. Marked reduction in the volume of the cystic and solid pelvic mass with now only a normal volume of right ovarian tissue measurable. 1. No evidence of disease progression in the thorax. 2. Interval decrease in size of mediastinal and supraclavicular lymphadenopathy.  CA 125 on 05/11/13 was 39.9.  On 07/26/2013 she underwent exploratory laparotomy total abdominal hysterectomy bilateral salpingo-oophorectomy omentectomy with and peritoneal excision.  Gross disease was notable only within the cul-de-sac and this was removed. Final pathology was consistent with serous carcinoma with associated fibrosis and psammoma bodies in the soft tissue biopsies of the peritoneum. Residual serous carcinoma was noted on the left ovary and the uterine serosa. Psammoma bodies were reported on the right ovary and bilateral fallopian tubes and also within the omentum.  Ms Aguayo completed  An additional three cycles of Taxol/Carboplatin post operatively.  Total 9 cycles 10/26/2013   CT 11/23/2013 IMPRESSION:  1. Interval hysterectomy. No evidence of residual or metastatic disease.  2. Cholelithiasis.   BRCA testing negative  Lab Results  Component Value Date   CA125 12.0 10/26/2013     Review of Systems  Constitutional: Feels well.  No fever, chills, good apetite Cardiovascular: No chest pain, shortness of breath, or edema.  Pulmonary: No cough or wheeze.  Gastrointestinal:  No nausea, vomiting, or diarrhea. No bright red  blood per rectum or change in bowel movement.  Genitourinary: No frequency, urgency, or dysuria. No vaginal bleeding or discharge.  Musculoskeletal: No myalgia or joint pain. Neurologic: No weakness, numbness, or change in gait.  Psychology: No depression, anxiety, or insomnia.  Social Hx:   Married.  Has returned to work.  Past Surgical Hx:  Past Surgical History  Procedure Laterality Date  . Wisdom tooth extraction    . Lymph node biopsy Left 01/21/2013    Procedure: EXCISIONAL BIOPSY OF THE NECK;  Surgeon: Izora Gala, MD;  Location: [facility];  Service: ENT;  Laterality: Left;  . Leep      20 year ago  . Vascular access device insertion Right     port a cath   . Abdominal hysterectomy Bilateral 07/26/2013    Procedure: EXPLORATORY LAPAROTOMY, TOTAL ABDOMINAL HYSTERECTOMY ,BILATERAL SALPINGO OOPHORECTOMY , OMENTECTOMY    ;  Surgeon: Janie Morning, MD;  Location: [facility];  Service: Gynecology;  Laterality: Bilateral;    Past Medical Hx:  Past Medical History  Diagnosis Date  . PONV (postoperative nausea and vomiting)   . MVP (mitral valve prolapse)   . Hypertension   . Anxiety   . Shortness of breath     at times "anxiety"  . Ovarian cancer on right   . Regional lymph node metastasis present   . Mental disorder     Family Hx:  Family History  Problem Relation Age of Onset  . Heart attack Father   . Melanoma Maternal Uncle     dx in his 93s; mets to brain  . Dementia Maternal Grandmother   . COPD Maternal Grandfather   . Dementia Paternal Grandmother   . COPD Paternal Grandfather   . Testicular cancer Son 21    Vitals:  Blood pressure 129/81, pulse 84, temperature 97.8 F (36.6 C), temperature source Oral, resp. rate 16, height 5' 6" (1.676 m), weight 152 lb 11.2 oz (69.264 kg).  Physical Exam:  General: Well developed, well nourished female in no acute distress. Alert and oriented  Cardiovascular: Regular rate and rhythm. Lungs: Clear to auscultation bilaterally.  No wheezes/crackles/rhonchi noted.  Skin: No rashes or lesions present.   Pelvic: Normal external genitalia Bartholin's urethra Skene's gland.  Vaginal cuff is intact no cul-de-sac masses Back: No CVA tenderness.  Extremities: No bilateral cyanosis, edema, or clubbing.  LN  No cervical supraclavicular or inguinal adenopathy  BREWSTER, WENDY,  11/24/2013, 12:04 PM

## 2013-11-25 ENCOUNTER — Telehealth: Payer: Self-pay

## 2013-11-25 NOTE — Telephone Encounter (Signed)
Message copied by Baruch Merl on Fri Nov 25, 2013  5:38 PM ------      Message from: Gordy Levan      Created: Fri Nov 25, 2013  8:50 AM       Labs seen and need follow up: please let her know CBC better yesterday. Still just a little anemic, but improving and WBC/ plt fine. Keep apt as scheduled. ------

## 2013-11-25 NOTE — Telephone Encounter (Signed)
Spoke with Deborah Macdonald and told her the information regarding her cbc as noted below  by Dr. Marko Plume. Deborah Macdonald will be returning to work on Monday 11-28-13.  She will keep appointment with Dr. Marko Plume as scheduled in April.

## 2013-11-30 ENCOUNTER — Other Ambulatory Visit: Payer: Self-pay | Admitting: Gynecologic Oncology

## 2013-11-30 DIAGNOSIS — G4700 Insomnia, unspecified: Secondary | ICD-10-CM

## 2013-11-30 MED ORDER — ZOLPIDEM TARTRATE ER 6.25 MG PO TBCR: 6.2500 mg | EXTENDED_RELEASE_TABLET | Freq: Every evening | ORAL | Status: DC | PRN

## 2013-12-01 IMAGING — CR DG CHEST 2V
2 series · 2 of 2 positions shown · non-contrast
Comparison: 01/20/2013

CLINICAL DATA: Ovarian cancer, followup, history hypertension,
former smoker

EXAM:
CHEST  2 VIEW

[w chest pa]
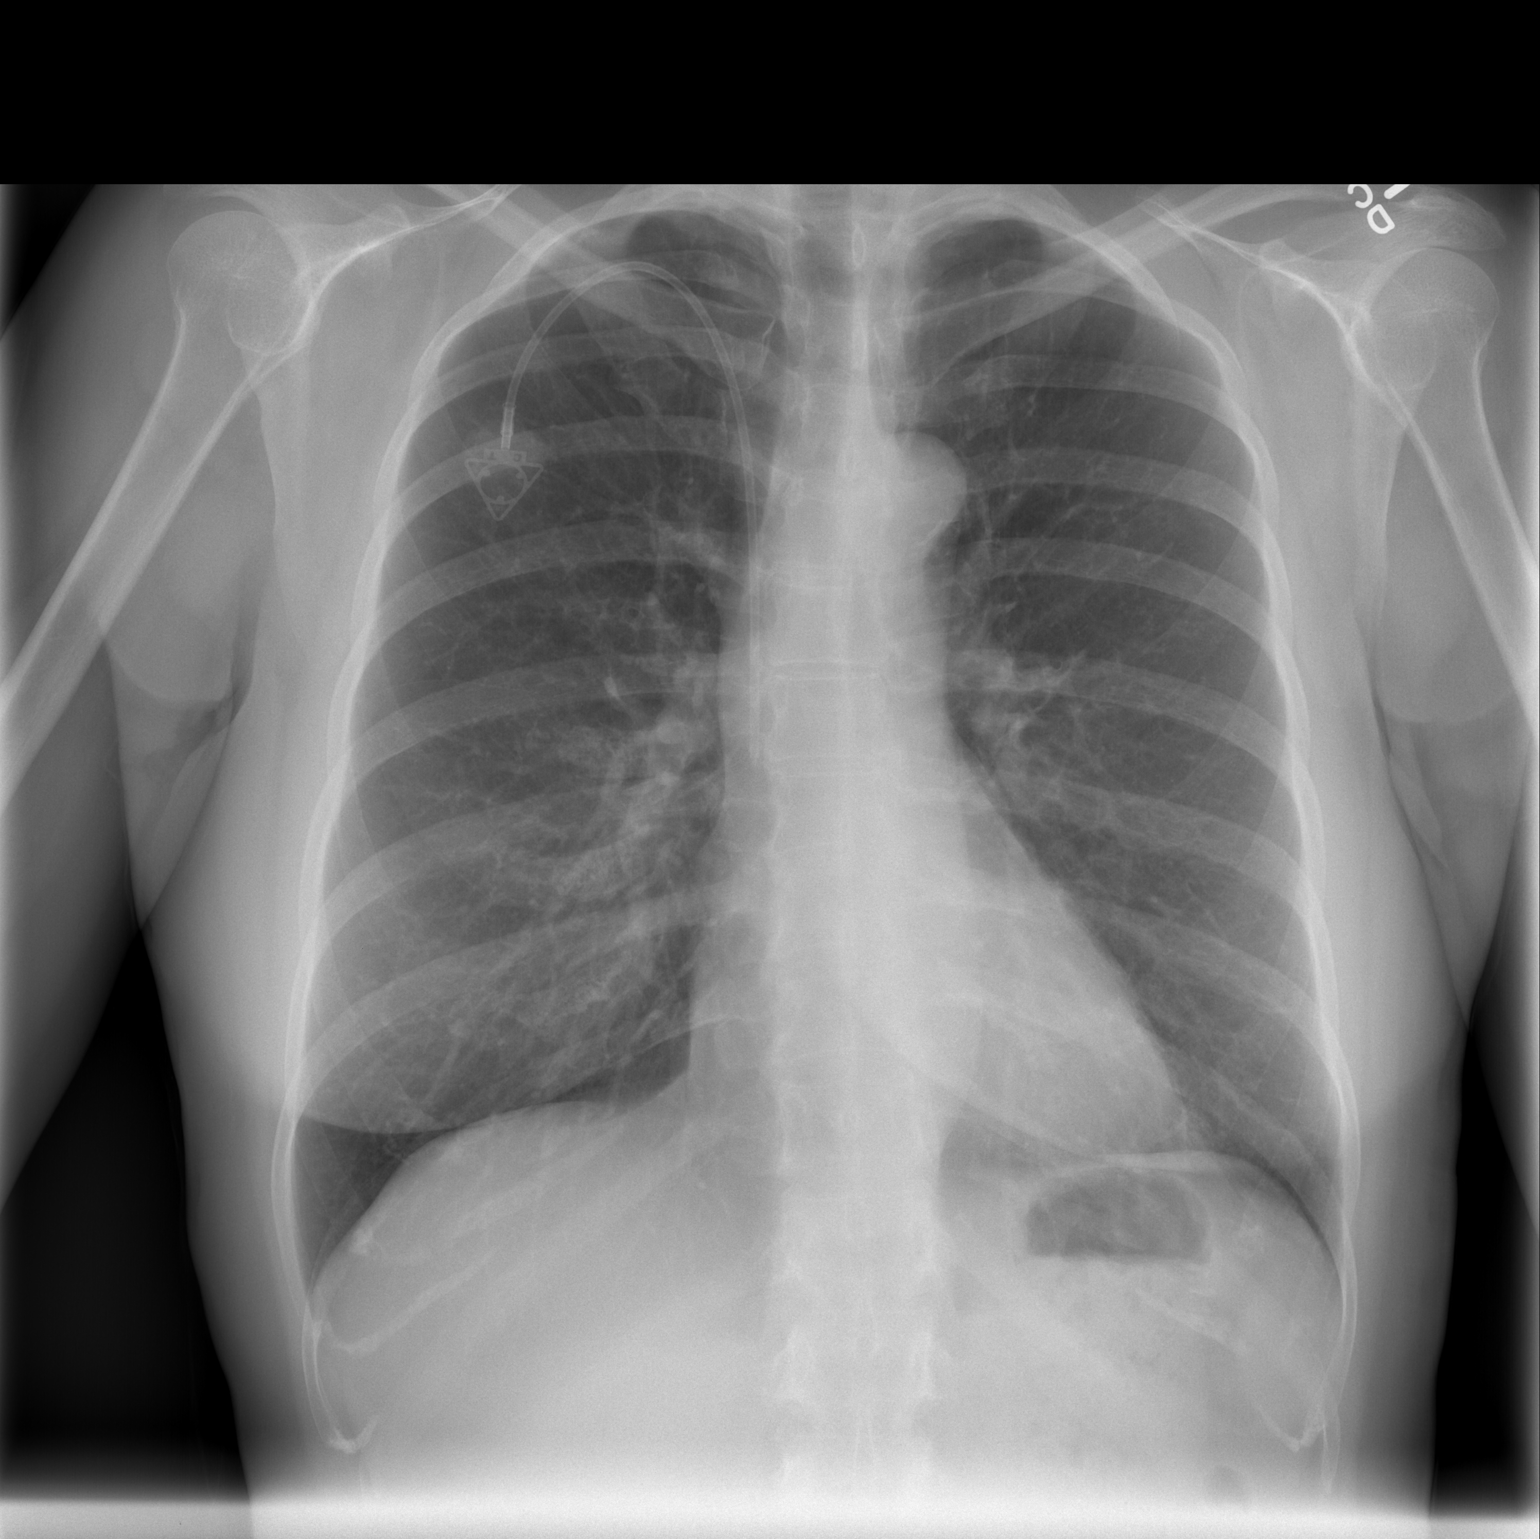

[w chest lat]
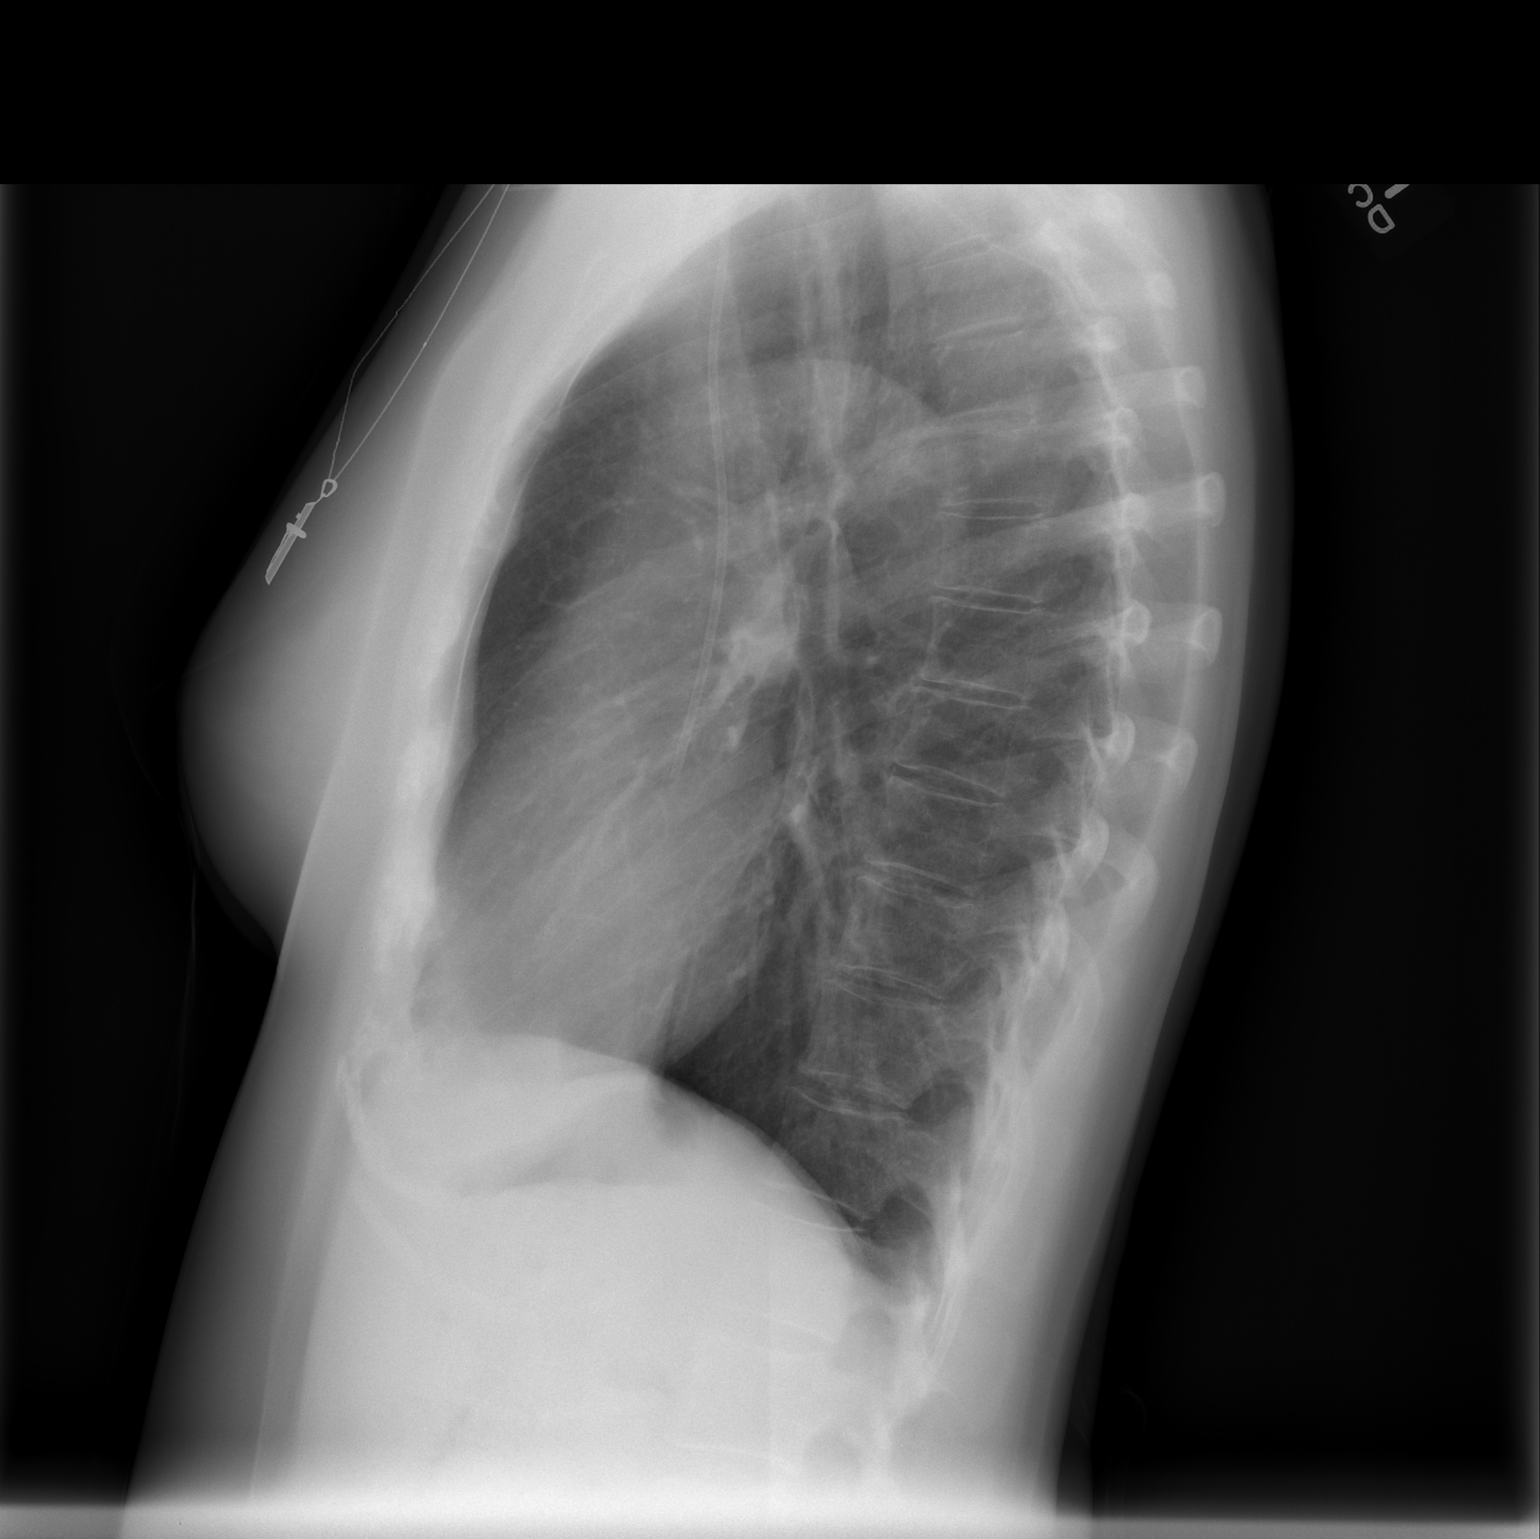

[2 of 2 positions shown; findings below may reference images not displayed]

FINDINGS: Right jugular Port-A-Cath, tip projecting over mid SVC.

Normal heart size, mediastinal contours, and pulmonary vascularity.

Atherosclerotic calcification aortic arch.

Lungs slightly hyperinflated but clear.

No pleural effusion or pneumothorax.

Bones demineralized.
IMPRESSION: Solid hyperinflated lungs.

Otherwise normal exam.

## 2014-01-15 ENCOUNTER — Other Ambulatory Visit: Payer: Self-pay | Admitting: Oncology

## 2014-01-16 ENCOUNTER — Telehealth: Payer: Self-pay | Admitting: Oncology

## 2014-01-16 ENCOUNTER — Ambulatory Visit (HOSPITAL_BASED_OUTPATIENT_CLINIC_OR_DEPARTMENT_OTHER): Payer: Commercial Managed Care - PPO | Admitting: Oncology

## 2014-01-16 ENCOUNTER — Other Ambulatory Visit (HOSPITAL_BASED_OUTPATIENT_CLINIC_OR_DEPARTMENT_OTHER): Payer: Commercial Managed Care - PPO

## 2014-01-16 ENCOUNTER — Ambulatory Visit: Payer: Commercial Managed Care - PPO

## 2014-01-16 VITALS — BP 127/80 | HR 76 | Temp 98.6°F | Resp 20 | Ht 66.0 in

## 2014-01-16 DIAGNOSIS — C569 Malignant neoplasm of unspecified ovary: Secondary | ICD-10-CM

## 2014-01-16 DIAGNOSIS — K8020 Calculus of gallbladder without cholecystitis without obstruction: Secondary | ICD-10-CM

## 2014-01-16 DIAGNOSIS — Z87891 Personal history of nicotine dependence: Secondary | ICD-10-CM

## 2014-01-16 DIAGNOSIS — R5383 Other fatigue: Secondary | ICD-10-CM

## 2014-01-16 DIAGNOSIS — C770 Secondary and unspecified malignant neoplasm of lymph nodes of head, face and neck: Secondary | ICD-10-CM

## 2014-01-16 DIAGNOSIS — Z95828 Presence of other vascular implants and grafts: Secondary | ICD-10-CM

## 2014-01-16 DIAGNOSIS — C561 Malignant neoplasm of right ovary: Secondary | ICD-10-CM

## 2014-01-16 DIAGNOSIS — R5381 Other malaise: Secondary | ICD-10-CM

## 2014-01-16 LAB — CBC WITH DIFFERENTIAL/PLATELET
BASO%: 1.1 % (ref 0.0–2.0)
Basophils Absolute: 0.1 10*3/uL (ref 0.0–0.1)
EOS ABS: 0.2 10*3/uL (ref 0.0–0.5)
EOS%: 4.4 % (ref 0.0–7.0)
HCT: 38.1 % (ref 34.8–46.6)
HGB: 12.8 g/dL (ref 11.6–15.9)
LYMPH%: 35.9 % (ref 14.0–49.7)
MCH: 31.9 pg (ref 25.1–34.0)
MCHC: 33.4 g/dL (ref 31.5–36.0)
MCV: 95.2 fL (ref 79.5–101.0)
MONO#: 0.5 10*3/uL (ref 0.1–0.9)
MONO%: 10.2 % (ref 0.0–14.0)
NEUT#: 2.5 10*3/uL (ref 1.5–6.5)
NEUT%: 48.4 % (ref 38.4–76.8)
Platelets: 211 10*3/uL (ref 145–400)
RBC: 4 10*6/uL (ref 3.70–5.45)
RDW: 12.6 % (ref 11.2–14.5)
WBC: 5.2 10*3/uL (ref 3.9–10.3)
lymph#: 1.8 10*3/uL (ref 0.9–3.3)

## 2014-01-16 LAB — COMPREHENSIVE METABOLIC PANEL (CC13)
ALBUMIN: 3.9 g/dL (ref 3.5–5.0)
ALT: 9 U/L (ref 0–55)
AST: 13 U/L (ref 5–34)
Alkaline Phosphatase: 75 U/L (ref 40–150)
Anion Gap: 11 mEq/L (ref 3–11)
BUN: 20.3 mg/dL (ref 7.0–26.0)
CO2: 24 mEq/L (ref 22–29)
Calcium: 9.4 mg/dL (ref 8.4–10.4)
Chloride: 108 mEq/L (ref 98–109)
Creatinine: 0.7 mg/dL (ref 0.6–1.1)
Glucose: 94 mg/dl (ref 70–140)
Potassium: 4.2 mEq/L (ref 3.5–5.1)
Sodium: 143 mEq/L (ref 136–145)
Total Bilirubin: 0.2 mg/dL (ref 0.20–1.20)
Total Protein: 6.3 g/dL — ABNORMAL LOW (ref 6.4–8.3)

## 2014-01-16 LAB — CA 125: CA 125: 9.4 U/mL (ref 0.0–30.2)

## 2014-01-16 MED ORDER — HEPARIN SOD (PORK) LOCK FLUSH 100 UNIT/ML IV SOLN
500.0000 [IU] | Freq: Once | INTRAVENOUS | Status: AC
  Administered 2014-01-16: 500 [IU] via INTRAVENOUS
  Filled 2014-01-16: qty 5

## 2014-01-16 MED ORDER — SODIUM CHLORIDE 0.9 % IJ SOLN
10.0000 mL | INTRAMUSCULAR | Status: DC | PRN
  Administered 2014-01-16: 10 mL via INTRAVENOUS
  Filled 2014-01-16: qty 10

## 2014-01-16 NOTE — Progress Notes (Signed)
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 She had CT CAP 11-23-13, without evidence of residual or metastatic disease, and saw Dr Skeet Latch on 11-24-13. CA 125 done last on 10-26-13 was 12. As long as she is doing well, Dr Skeet Latch plans to see her again 3 months from my appointment today.  She had negative testing for breast/ovarian cancer genetics panel in 06-2013 at [facility].  She has PAC, flushed today. She understands that this needs to be flushed every 6-8 weeks when not otherwise used.   She is feeling well, has been working out for an hour 3x weekly for past month at [facility], which she is tolerating progressively better. She states she has not lost any weight, tho refused to weigh today. Appetite is excellent.Bowels are moving regularly with colace. No abdominal or pelvic discomfort other than minimal, occasional RUQ discomfort not clearly associated with activity or diet. She is aware that CT showed cholelithiasis without biliary ductal dilatation or inflammation; we have discussed no fatty or greasy foods and would ask general surgeon to consult if increased symptoms.    ONCOLOGIC HISTORY Patient saw Dr Juanda Crumble Lomax/ NP Eve regularly for years and was up to date on gyn exams at the time of this diagnosis. She presented with several months of slowly enlarging left supraclavicular adenopathy, measuring ~ 2 cm at time of biopsy by Dr Adrian Saran 01-22-12. Pathology [phone]) showed metastatic adenocarcinoma with immunohistochemical profile consistent with gyn vs breast primary, including ER PR +. CA 125 was 1202. Breast imaging 01-28-13 at [facility]  (bilateral diagnostic mammograms and [place]) had no findings of concern. PET/CT 02-03-13 in [facility] system found bilateral hypermetabolic supraclavicular and mediastinal adenopathy; 8.7 x 8.7 hypermetabolic cystic and solid pelvic mass with adjacent similar mass in right iliac fossa; multiple hypermetabolic peritoneal implants including area 5.5 x 3.5 cm at liver margin; no pulmonary or bony lesions. She was seen by Dr Janie Morning, with recommendation for dose dense taxol carboplatin, this given x 6 cycles from 02-09-2013 thru 06-16-2013, tolerated generally well without gCSF or any dose reductions. CT CAP 05-09-13 after 4 cycles of chemotherapy found radiographic resolution of supraclavicular adenopathy, no findings of concern in chest, near complete resolution of bulky peritoneal implants and marked improvement in abdominal and pelvic mass. She had two additional cycles of chemotherapy thru 06-16-2013, then interval surgery by Dr Skeet Latch on 07-26-13. Surgery was TAH BSO omentectomy and resection of peritoneal nodules, with no gross residual disease in abdomen or pelvis at completion of procedure, then same dose dense chemo for 3 additional cycles post surgery, completed 10-26-2013. CT CAP 11-23-13 had no evidence of recurrent or metastatic disease.     Review of systems as above, also: She is fatigued by evening after working full pace all day; she does not tolerate >40 hours per week (job requesting 42.5 hrs/week) and I have written note to limit to <= 40/week. No increased SOB. No fever or symptoms of infection. No problems with PAC. Remainder of 10 point Review of Systems negative.  Objective:  Vital signs in last 24 hours:  BP 127/80  Pulse 76  Temp(Src) 98.6 F (37 C) (Oral)  Resp 20  Ht 5\' 6"  (  1.676 m) Patient refused to be weighed, states has not lost weight since beginning regular exercise a month ago. Alert, oriented and appropriate. Ambulatory without difficulty. Looks comfortable and energetic  today. No alopecia.  HEENT:PERRL, sclerae not icteric. Oral mucosa moist without lesions, posterior pharynx clear.  Neck supple. No JVD.  Lymphatics:no cervical,suraclavicular, axillary or inguinal adenopathy Resp: clear to auscultation bilaterally and normal percussion bilaterally Cardio: regular rate and rhythm. No gallop. GI: soft, nontender, not distended, no mass or organomegaly. Normally active bowel sounds. Surgical incision not remarkable. Musculoskeletal/ Extremities: without pitting edema, cords, tenderness Neuro: no peripheral neuropathy. Otherwise nonfocal Skin without rash, ecchymosis, petechiae Breasts: without dominant mass, skin or nipple findings. Axillae benign. Portacath-without erythema or tenderness  Lab Results:  Results for orders placed in visit on 01/16/14  CBC WITH DIFFERENTIAL      Result Value Ref Range   WBC 5.2  3.9 - 10.3 10e3/uL   NEUT# 2.5  1.5 - 6.5 10e3/uL   HGB 12.8  11.6 - 15.9 g/dL   HCT 38.1  34.8 - 46.6 %   Platelets 211  145 - 400 10e3/uL   MCV 95.2  79.5 - 101.0 fL   MCH 31.9  25.1 - 34.0 pg   MCHC 33.4  31.5 - 36.0 g/dL   RBC 4.00  3.70 - 5.45 10e6/uL   RDW 12.6  11.2 - 14.5 %   lymph# 1.8  0.9 - 3.3 10e3/uL   MONO# 0.5  0.1 - 0.9 10e3/uL   Eosinophils Absolute 0.2  0.0 - 0.5 10e3/uL   Basophils Absolute 0.1  0.0 - 0.1 10e3/uL   NEUT% 48.4  38.4 - 76.8 %   LYMPH% 35.9  14.0 - 49.7 %   MONO% 10.2  0.0 - 14.0 %   EOS% 4.4  0.0 - 7.0 %   BASO% 1.1  0.0 - 2.0 %  COMPREHENSIVE METABOLIC PANEL (UM35)      Result Value Ref Range   Sodium 143  136 - 145 mEq/L   Potassium 4.2  3.5 - 5.1 mEq/L   Chloride 108  98 - 109 mEq/L   CO2 24  22 - 29 mEq/L   Glucose 94  70 - 140 mg/dl   BUN 20.3  7.0 - 26.0 mg/dL   Creatinine 0.7  0.6 - 1.1 mg/dL   Total Bilirubin 0.20  0.20 - 1.20 mg/dL   Alkaline Phosphatase 75  40 - 150 U/L   AST 13  5 - 34 U/L   ALT 9  0 - 55 U/L   Total Protein 6.3 (*) 6.4 - 8.3 g/dL   Albumin 3.9  3.5 - 5.0 g/dL    Calcium 9.4  8.4 - 10.4 mg/dL   Anion Gap 11  3 - 11 mEq/L    CA 125 available after visit 9.4, having been 12 on 10-26-13  Studies/Results:  No results found.  Medications: I have reviewed the patient's current medications. She is using ambien CR <=1x weekly, very helpful when she needs it.  DISCUSSION Patient understands understands ongoing follow up and PAC flushes. She will call prior to scheduled visits if needed. 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 Clinically no evidence of disease after taxol carboplatinum, interval debulking and additional same chemotherapy for total 9 cycles thru 10-26-13. She will see Dr Skeet Latch in July, will have marker followed with PAC flushes, and  I will see her coordinating with flush in ~ Sept.  2.negative genetics testing for breast/ovarian in 06-2013 3.PAC in: will flush every 6-8 weeks, so ~ June 2 with CA 125 and CMET, ~ July 27 with CA125 and CMET, and ~ 9-21 with cbc, CMET and CA125. 4.long past tobacco, discontinued with cancer diagnosis 5.due mammograms later this month. Dense breast tissue on mammograms 01-28-14, which were diagnostic due to the workup of left supraclavicular node then; screening 3D/tomo would be reasonable this year. 6.weight gain thru chemo: encouraged her to continue working out at gym and to begin walking on days that she does not go to gym. 7.chemo related cytopenias resolved 8.cholelithiasis seen on CT, not obviously symptomatic. As above.   Time spent 25 min including >50% counseling and coordination of care.  Jaline Pincock P, MD   01/16/2014, 10:55 AM

## 2014-01-16 NOTE — Telephone Encounter (Signed)
, °

## 2014-01-17 ENCOUNTER — Other Ambulatory Visit: Payer: Self-pay | Admitting: Oncology

## 2014-01-17 ENCOUNTER — Encounter: Payer: Self-pay | Admitting: Oncology

## 2014-01-17 ENCOUNTER — Telehealth: Payer: Self-pay

## 2014-01-17 DIAGNOSIS — G4700 Insomnia, unspecified: Secondary | ICD-10-CM

## 2014-01-17 DIAGNOSIS — Z1231 Encounter for screening mammogram for malignant neoplasm of breast: Secondary | ICD-10-CM

## 2014-01-17 MED ORDER — ZOLPIDEM TARTRATE ER 6.25 MG PO TBCR: 6.2500 mg | EXTENDED_RELEASE_TABLET | Freq: Every evening | ORAL | Status: DC | PRN

## 2014-01-17 NOTE — Telephone Encounter (Signed)
          Patient Demographics     Patient Name Sex DOB [facility] Address Phone    Edinger, [place] M Female [birth year] [id number] [place] [address] [phone] [facility]) [phone] (Work) [phone] (Mobile)              Message Received: Today     Gordy Levan, MD Baruch Merl, RN; Patton Salles, RN            Please let patient know that she should have mammograms this spring - I recommend 3D/tomo as she had dense tissue on the mammograms done at [facility] 01-28-13, and I have put that comment in the order for the mammograms now. The 3D imaging takes just a couple of seconds longer, which has not been noticeable to other patients but gives much clearer pictures with dense tissue.  If she prefers a different place than [facility], that is also fine.  POF done  Cc LA, TH

## 2014-01-17 NOTE — Telephone Encounter (Signed)
Told Deborah Macdonald the results of her labs from 01-16-14 as noted below by Dr. Marko Plume.  Deborah Macdonald verbalize understanding and will increase protein in her diet as suggested.

## 2014-01-17 NOTE — Telephone Encounter (Signed)
Message copied by Baruch Merl on Tue Jan 17, 2014 11:25 AM ------      Message from: Gordy Levan      Created: Mon Jan 16, 2014  3:37 PM       Labs seen and need follow up: please let her know marker is in good low range at 9.4 and chemistries good, other than protein still just a little low. Fine to try to increase protein in diet ------

## 2014-01-17 NOTE — Telephone Encounter (Signed)
Message copied by Baruch Merl on Tue Jan 17, 2014  4:29 PM ------      Message from: Gordy Levan      Created: Tue Jan 17, 2014  1:39 PM       Please refill ambien CR 6.25 mg, 1 qhs prn #30. (Last filled Feb 2015 and using 3-4 x monthly now)            Cc LA, TH ------

## 2014-01-17 NOTE — Telephone Encounter (Signed)
Told Deborah Macdonald that the Ambien was refilled and the prescription was sent to [facility] on [address]. Discussed mammograms as noted below by Dr. Marko Plume.  Deborah Macdonald verbalized understanding.  She is fine with having the [facility] the [facility].

## 2014-01-20 ENCOUNTER — Telehealth: Payer: Self-pay | Admitting: Oncology

## 2014-01-20 NOTE — Telephone Encounter (Signed)
lmonvm for pt re mammo 4/22 @ [facility]. schedule mailed. no other orders.

## 2014-02-01 ENCOUNTER — Ambulatory Visit
Admission: RE | Admit: 2014-02-01 | Discharge: 2014-02-01 | Disposition: A | Discharge status: Home / Self Care | Payer: Self-pay | Source: Ambulatory Visit | Attending: Oncology | Admitting: Oncology

## 2014-02-01 DIAGNOSIS — Z1231 Encounter for screening mammogram for malignant neoplasm of breast: Secondary | ICD-10-CM

## 2014-03-14 ENCOUNTER — Ambulatory Visit: Payer: Commercial Managed Care - PPO

## 2014-03-14 ENCOUNTER — Other Ambulatory Visit (HOSPITAL_BASED_OUTPATIENT_CLINIC_OR_DEPARTMENT_OTHER): Payer: Commercial Managed Care - PPO

## 2014-03-14 VITALS — BP 174/81 | HR 91 | Temp 98.3°F

## 2014-03-14 DIAGNOSIS — C561 Malignant neoplasm of right ovary: Secondary | ICD-10-CM

## 2014-03-14 DIAGNOSIS — Z95828 Presence of other vascular implants and grafts: Secondary | ICD-10-CM

## 2014-03-14 DIAGNOSIS — C569 Malignant neoplasm of unspecified ovary: Secondary | ICD-10-CM

## 2014-03-14 DIAGNOSIS — C770 Secondary and unspecified malignant neoplasm of lymph nodes of head, face and neck: Secondary | ICD-10-CM

## 2014-03-14 LAB — COMPREHENSIVE METABOLIC PANEL (CC13)
ALT: 15 U/L (ref 0–55)
ANION GAP: 12 meq/L — AB (ref 3–11)
AST: 14 U/L (ref 5–34)
Albumin: 3.6 g/dL (ref 3.5–5.0)
Alkaline Phosphatase: 78 U/L (ref 40–150)
BILIRUBIN TOTAL: 0.25 mg/dL (ref 0.20–1.20)
BUN: 17.1 mg/dL (ref 7.0–26.0)
CO2: 21 meq/L — AB (ref 22–29)
CREATININE: 0.6 mg/dL (ref 0.6–1.1)
Calcium: 9.1 mg/dL (ref 8.4–10.4)
Chloride: 110 mEq/L — ABNORMAL HIGH (ref 98–109)
GLUCOSE: 96 mg/dL (ref 70–140)
Potassium: 4.4 mEq/L (ref 3.5–5.1)
Sodium: 143 mEq/L (ref 136–145)
Total Protein: 6.1 g/dL — ABNORMAL LOW (ref 6.4–8.3)

## 2014-03-14 LAB — CA 125: CA 125: 11.7 U/mL (ref 0.0–30.2)

## 2014-03-14 MED ORDER — HEPARIN SOD (PORK) LOCK FLUSH 100 UNIT/ML IV SOLN
500.0000 [IU] | Freq: Once | INTRAVENOUS | Status: AC
  Administered 2014-03-14: 500 [IU] via INTRAVENOUS
  Filled 2014-03-14: qty 5

## 2014-03-14 MED ORDER — SODIUM CHLORIDE 0.9 % IJ SOLN
10.0000 mL | INTRAMUSCULAR | Status: DC | PRN
  Administered 2014-03-14: 10 mL via INTRAVENOUS
  Filled 2014-03-14: qty 10

## 2014-03-14 NOTE — Patient Instructions (Signed)
Implanted Port Home Guide An implanted port is a type of central line that is placed under the skin. Central lines are used to provide IV access when treatment or nutrition needs to be given through a person's veins. Implanted ports are used for long-term IV access. An implanted port Sorrels be placed because:   You need IV medicine that would be irritating to the small veins in your hands or arms.   You need long-term IV medicines, such as antibiotics.   You need IV nutrition for a long period.   You need frequent blood draws for lab tests.   You need dialysis.  Implanted ports are usually placed in the chest area, but they can also be placed in the upper arm, the abdomen, or the leg. An implanted port has two main parts:   Reservoir. The reservoir is round and will appear as a small, raised area under your skin. The reservoir is the part where a needle is inserted to give medicines or draw blood.   Catheter. The catheter is a thin, flexible tube that extends from the reservoir. The catheter is placed into a large vein. Medicine that is inserted into the reservoir goes into the catheter and then into the vein.  HOW WILL I CARE FOR MY INCISION SITE? Do not get the incision site wet. Bathe or shower as directed by your health care provider.  HOW IS MY PORT ACCESSED? Special steps must be taken to access the port:   Before the port is accessed, a numbing cream can be placed on the skin. This helps numb the skin over the port site.   Your health care provider uses a sterile technique to access the port.  Your health care provider must put on a mask and sterile gloves.  The skin over your port is cleaned carefully with an antiseptic and allowed to dry.  The port is gently pinched between sterile gloves, and a needle is inserted into the port.  Only "non-coring" port needles should be used to access the port. Once the port is accessed, a blood return should be checked. This helps  ensure that the port is in the vein and is not clogged.   If your port needs to remain accessed for a constant infusion, a clear (transparent) bandage will be placed over the needle site. The bandage and needle will need to be changed every week, or as directed by your health care provider.   Keep the bandage covering the needle clean and dry. Do not get it wet. Follow your health care provider's instructions on how to take a shower or bath while the port is accessed.   If your port does not need to stay accessed, no bandage is needed over the port.  WHAT IS FLUSHING? Flushing helps keep the port from getting clogged. Follow your health care provider's instructions on how and when to flush the port. Ports are usually flushed with saline solution or a medicine called heparin. The need for flushing will depend on how the port is used.   If the port is used for intermittent medicines or blood draws, the port will need to be flushed:   After medicines have been given.   After blood has been drawn.   As part of routine maintenance.   If a constant infusion is running, the port Bushnell not need to be flushed.  HOW LONG WILL MY PORT STAY IMPLANTED? The port can stay in for as long as your health care   provider thinks it is needed. When it is time for the port to come out, surgery will be done to remove it. The procedure is similar to the one performed when the port was put in.  WHEN SHOULD I SEEK IMMEDIATE MEDICAL CARE? When you have an implanted port, you should seek immediate medical care if:   You notice a bad smell coming from the incision site.   You have swelling, redness, or drainage at the incision site.   You have more swelling or pain at the port site or the surrounding area.   You have a fever that is not controlled with medicine. Document Released: 09/29/2005 Document Revised: 07/20/2013 Document Reviewed: 06/06/2013 ExitCare Patient Information 2014 [employer].  

## 2014-03-15 ENCOUNTER — Telehealth: Payer: Self-pay | Admitting: *Deleted

## 2014-03-15 NOTE — Telephone Encounter (Signed)
Called patient with CA 125 results

## 2014-03-29 ENCOUNTER — Other Ambulatory Visit: Payer: Self-pay

## 2014-03-29 NOTE — Progress Notes (Signed)
Pt. Called to have PAC flushes set up from 03-13-14.  Sent POF to schedulers.

## 2014-03-30 ENCOUNTER — Telehealth: Payer: Self-pay | Admitting: Oncology

## 2014-03-30 NOTE — Telephone Encounter (Signed)
lvm for pt regarding to July and Sept...mailed pt appt sched/and letter

## 2014-04-03 ENCOUNTER — Telehealth: Payer: Self-pay

## 2014-04-03 NOTE — Telephone Encounter (Signed)
Deborah Macdonald would like to see Dr. Marko Plume asap to discuss being written out of work for STD due to stress at work. .Her manager has written her up 3 times since her return to work 11-28-13.  She feels that her boss wants her gone since she has had health issues-cancer. Told [name] that Dr. Marko Plume said that she need to discuss this with her PCP.  Patient verbalized understanding.

## 2014-04-30 ENCOUNTER — Emergency Department (INDEPENDENT_AMBULATORY_CARE_PROVIDER_SITE_OTHER)
Admission: EM | Admit: 2014-04-30 | Discharge: 2014-04-30 | Disposition: A | Discharge status: Home / Self Care | Payer: BC Managed Care – PPO | Source: Home / Self Care | Attending: Emergency Medicine | Admitting: Emergency Medicine

## 2014-04-30 ENCOUNTER — Emergency Department (INDEPENDENT_AMBULATORY_CARE_PROVIDER_SITE_OTHER): Payer: BC Managed Care – PPO

## 2014-04-30 ENCOUNTER — Encounter (HOSPITAL_COMMUNITY): Payer: Self-pay | Admitting: Emergency Medicine

## 2014-04-30 DIAGNOSIS — T1490XA Injury, unspecified, initial encounter: Secondary | ICD-10-CM

## 2014-04-30 DIAGNOSIS — Y92009 Unspecified place in unspecified non-institutional (private) residence as the place of occurrence of the external cause: Secondary | ICD-10-CM

## 2014-04-30 DIAGNOSIS — T148XXA Other injury of unspecified body region, initial encounter: Secondary | ICD-10-CM

## 2014-04-30 NOTE — ED Provider Notes (Signed)
Chief Complaint   Chief Complaint  Patient presents with  . Foot Injury    History of Present Illness   Deborah Macdonald is a 58 year old female who injured her left foot this afternoon. She was going down some steps from her deck and slipped, falling forward, and plantar flexing her left foot. Ever since then she's had swelling and bruising over the dorsum of the foot. She does not have much pain. She is able to walk without any discomfort. She can move the ankle without any pain.  Review of Systems   Other than as noted above, the patient denies any of the following symptoms: Systemic:  No fevers or chills. Musculoskeletal:  No joint pain or arthritis.  Neurological:  No muscular weakness, paresthesias.   [place]   Past medical history, family history, social history, meds, and allergies were reviewed.     Physical  Examination     Vital signs:  BP 152/84  Pulse 85  Temp(Src) 98 F (36.7 C) (Oral)  Resp 12  SpO2 99% Gen:  Alert and oriented times 3.  In no distress. Musculoskeletal:  Exam of the foot reveals she has a hematoma over the dorsum of the foot. This is minimally tender to palpation. Ankle joint has a full range of motion with no pain. There's no pain with dorsiflexion or plantar flexion.  Otherwise, all joints had a full a ROM with no swelling, bruising or deformity.  No edema, pulses full. Extremities were warm and pink.  Capillary refill was brisk.  Skin:  Clear, warm and dry.  No rash. Neuro:  Alert and oriented times 3.  Muscle strength was normal.  Sensation was intact to light touch.    Radiology   Dg Foot Complete Left  04/30/2014   CLINICAL DATA:  Foot injury. Soft tissue swelling along the lateral metatarsals.  EXAM: LEFT FOOT - COMPLETE 3+ VIEW  COMPARISON:  None.  FINDINGS: Mild lateral spurring at the base of the first metatarsal. Small accessory navicular separated from the main navicular by 4 mm.  No malalignment at the Lisfranc joint is identified. Is  there is soft tissue swelling overlying the Lisfranc joint.  IMPRESSION: 1. I do not observe a definite fracture, but there is some soft tissue swelling dorsal to the Lisfranc joint. No malalignment observed. 2. Small accessory navicular separated from the main navicular by 4 mm. This is probably incidental, but correlate with any medial tenderness along the tibialis posterior tendon/medial ankle.   Electronically Signed   By: Sherryl Barters M.D.   On: 04/30/2014 18:16   I reviewed the images independently and personally and concur with the radiologist's findings.  Course in Urgent [facility]   The foot was wrapped in Ace wrap.  Assessment   The primary encounter diagnosis was Hematoma. Diagnoses of Accidental injury and Place of occurrence, home were also pertinent to this visit.  Her foot is not very tender. She has a full range of dorsiflexion and plantarflexion. I doubt that there is any injury to the Lisfranc joint.  Plan    1.  Meds:  The following meds were prescribed:   Discharge Medication List as of 04/30/2014  6:45 PM      2.  Patient Education/Counseling:  The patient was given appropriate handouts, self care instructions, and instructed in symptomatic relief including rest and activity, elevation, application of ice and compression.    3.  Follow up:  The patient was told to follow up here if  no better in 3 to 4 days, or sooner if becoming worse in any way, and given some red flag symptoms such as worsening pain or neurological symptoms which would prompt immediate return.  Follow up here as necessary.       Harden Mo, MD 04/30/14 2014

## 2014-04-30 NOTE — Discharge Instructions (Signed)
RICE: Routine Care for Injuries The routine care of many injuries includes Rest, Ice, Compression, and Elevation (RICE). HOME CARE INSTRUCTIONS  Rest is needed to allow your body to heal. Routine activities can usually be resumed when comfortable. Injured tendons and bones can take up to 6 weeks to heal. Tendons are the cord-like structures that attach muscle to bone.  Ice following an injury helps keep the swelling down and reduces pain.  Put ice in a plastic bag.  Place a towel between your skin and the bag.  Leave the ice on for 15-20 minutes, 3-4 times a day, or as directed by your health care provider. Do this while awake, for the first 24 to 48 hours. After that, continue as directed by your caregiver.  Compression helps keep swelling down. It also gives support and helps with discomfort. If an elastic bandage has been applied, it should be removed and reapplied every 3 to 4 hours. It should not be applied tightly, but firmly enough to keep swelling down. Watch fingers or toes for swelling, bluish discoloration, coldness, numbness, or excessive pain. If any of these problems occur, remove the bandage and reapply loosely. Contact your caregiver if these problems continue.  Elevation helps reduce swelling and decreases pain. With extremities, such as the arms, hands, legs, and feet, the injured area should be placed near or above the level of the heart, if possible. SEEK IMMEDIATE MEDICAL CARE IF:  You have persistent pain and swelling.  You develop redness, numbness, or unexpected weakness.  Your symptoms are getting worse rather than improving after several days. These symptoms Siebers indicate that further evaluation or further X-rays are needed. Sometimes, X-rays Saraceni not show a small broken bone (fracture) until 1 week or 10 days later. Make a follow-up appointment with your caregiver. Ask when your X-ray results will be ready. Make sure you get your X-ray results. Document Released:  01/11/2001 Document Revised: 10/04/2013 Document Reviewed: 02/28/2011 ExitCare Patient Information 2015 [employer]. This information is not intended to replace advice given to you by your health care provider. Make sure you discuss any questions you have with your health care provider.  

## 2014-04-30 NOTE — ED Notes (Signed)
Pt. Stated, I was walking down some steps and my foot buckled underneath.. Denies any pain just swelling.

## 2014-05-15 ENCOUNTER — Ambulatory Visit (HOSPITAL_BASED_OUTPATIENT_CLINIC_OR_DEPARTMENT_OTHER): Payer: BC Managed Care – PPO

## 2014-05-15 VITALS — BP 143/89 | HR 73 | Temp 97.9°F

## 2014-05-15 DIAGNOSIS — Z452 Encounter for adjustment and management of vascular access device: Secondary | ICD-10-CM

## 2014-05-15 DIAGNOSIS — Z95828 Presence of other vascular implants and grafts: Secondary | ICD-10-CM

## 2014-05-15 DIAGNOSIS — C770 Secondary and unspecified malignant neoplasm of lymph nodes of head, face and neck: Secondary | ICD-10-CM

## 2014-05-15 DIAGNOSIS — C569 Malignant neoplasm of unspecified ovary: Secondary | ICD-10-CM

## 2014-05-15 MED ORDER — SODIUM CHLORIDE 0.9 % IJ SOLN
10.0000 mL | INTRAMUSCULAR | Status: DC | PRN
  Administered 2014-05-15: 10 mL via INTRAVENOUS
  Filled 2014-05-15: qty 10

## 2014-05-15 MED ORDER — HEPARIN SOD (PORK) LOCK FLUSH 100 UNIT/ML IV SOLN
500.0000 [IU] | Freq: Once | INTRAVENOUS | Status: AC
  Administered 2014-05-15: 500 [IU] via INTRAVENOUS
  Filled 2014-05-15: qty 5

## 2014-05-15 NOTE — Patient Instructions (Signed)

## 2014-05-20 ENCOUNTER — Other Ambulatory Visit: Payer: Self-pay | Admitting: Gynecologic Oncology

## 2014-05-23 ENCOUNTER — Other Ambulatory Visit: Payer: Self-pay | Admitting: Gynecologic Oncology

## 2014-05-23 DIAGNOSIS — R232 Flushing: Secondary | ICD-10-CM

## 2014-05-23 MED ORDER — CLONIDINE HCL 0.1 MG PO TABS: 0.1000 mg | ORAL_TABLET | Freq: Every day | ORAL | Status: DC

## 2014-05-23 NOTE — Telephone Encounter (Signed)
Medication refilled by Joylene John, NP

## 2014-05-23 NOTE — Progress Notes (Signed)
Patient called requesting refill on clonidine for vasomotor symptoms.  Reporting doing well on the medication with relief of hot flashes.  Refill sent in.  Appt scheduled for Sept 2015 with Dr. Skeet Latch.  Advised to call for any questions or concerns.

## 2014-06-13 ENCOUNTER — Telehealth: Payer: Self-pay | Admitting: Gynecologic Oncology

## 2014-06-13 NOTE — Telephone Encounter (Signed)
Office Visit:  Deborah Macdonald 58 y.o. female  CC:  Chief Complaint  Patient presents with  . Abstract    Assessment/Plan:  Deborah Macdonald is a 58 y.o. with stage IV B. ovarian carcinoma. The initial presentation was that of extra-abdominal lymphadenopathy, metastatic disease within the supraclavicular lymph nodes, and CA 125 elevated to 1202.4. She received 6 cycles of dose dense neoadjuvant Taxol platinum therapy.  On 07/27/2003 she she underwent total abdominal hysterectomy bilateral salpingo-oophorectomy and omentectomy with excision of peritoneal metastases. No gross residual disease was present at the completion of surgery. Final pathology is notable for serous carcinoma within the peritoneal nodule and the left ovary and uterine serosa. 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   I've recommended that Deborah Macdonald receive an additional 3 cycles of dose dense Taxol carboplatin therapy.  No consolidation trials are available at this time and so I discussed with her that after completion of chemotherapy we will obtain a CT scan of the abdomen and pelvis to establish a new baseline and recommend additional tests based on symptomatology.  Followup with GYN oncology in 3 months Followup with Dr. Marko Plume on 08/17/2013  HPI:  Deborah Macdonald is a 58 y.o.  gravida 3 para 3, who was noted to have left cervical adenopathy in September 2013. She noted that it was a slowly increasing in size and the initial workup was for evaluation for lung disease. She underwent excision of supraclavicular LN on 01/21/2013 The malignant cells are positive for cytokeratin 7, estrogen receptor, progesterone receptor, and focally for p53. They are negative for cytokeratin 20, GCDFP, Napsin-A, , thyroglobulin, and TTF-1. This immunohistochemical profile raises the possibility of metastatic gynecologic carcinoma or metastatic breast carcinoma. The former is favored.  01/27/13 PET IMPRESSION: 1. 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 2. Bulky peritoneal metastasis in the upper abdomen and along the capsule of the liver. 3. Hypermetabolic retroperitoneal nodal metastasis. 4. Small hypermetabolic mediastinal nodal metastasis. 5. Supraclavicular hypermetabolic nodal metastasis.    She completed six cycles of carbo/taxol, last on 06/16/13.  CT chest/abdomen/pelvis on 05/09/13 prior to completion of chemotherapy noted  1. Near complete resolution of the bulky the peritoneal implants. 2. Marked reduction in size of periaortic adenopathy with lymph nodes now less than 5 mm. 3. Marked reduction in the volume of the cystic and solid pelvic mass with now only a normal volume of right ovarian tissue measurable. 1. No evidence of disease progression in the thorax. 2. Interval decrease in size of mediastinal and supraclavicular lymphadenopathy.  CA 125 on 05/11/13 was 39.9.  On 07/26/2013 she underwent exploratory laparotomy total abdominal hysterectomy bilateral salpingo-oophorectomy omentectomy with and peritoneal excision.  Gross disease was notable only within the cul-de-sac and this was removed. Final pathology was consistent with serous carcinoma with associated fibrosis and psammoma bodies in the soft tissue biopsies of the peritoneum. Residual serous carcinoma was noted on the left ovary and the uterine serosa. Psammoma bodies were reported on the right ovary and bilateral fallopian tubes and also within the omentum.  Postoperatively she received an additional 6 cycles taxol/Platin completed 10/2013. CT 11/23/2013 IMPRESSION:  1. Interval hysterectomy. No evidence of residual or metastatic  disease.  2. Cholelithiasis   Review of Systems  Constitutional: Feels well.  No fever, chills, early satiety, or unintentional weight loss or gain.  Cardiovascular: No  chest pain, shortness of breath, or edema.  Pulmonary: No cough or wheeze.  Gastrointestinal: No nausea, vomiting, or diarrhea. No bright red blood per rectum or change in bowel movement.  Genitourinary: No frequency, urgency, or dysuria. No vaginal bleeding or discharge.  Musculoskeletal: No myalgia or joint pain. Neurologic: No weakness, numbness, or change in gait.  Psychology: No depression, anxiety, or insomnia.   Social Hx:     Past Surgical Hx:  Past Surgical History  Procedure Laterality Date  . Wisdom tooth extraction    . Lymph node biopsy Left 01/21/2013    Procedure: EXCISIONAL BIOPSY OF THE NECK;  Surgeon: Izora Gala, MD;  Location: [facility];  Service: ENT;  Laterality: Left;  . Leep      20 year ago  . Vascular access device insertion Right     port a cath   . Abdominal hysterectomy Bilateral 07/26/2013    Procedure: EXPLORATORY LAPAROTOMY, TOTAL ABDOMINAL HYSTERECTOMY ,BILATERAL SALPINGO OOPHORECTOMY , OMENTECTOMY    ;  Surgeon: Janie Morning, MD;  Location: [facility];  Service: Gynecology;  Laterality: Bilateral;    Past Medical Hx:  Past Medical History  Diagnosis Date  . PONV (postoperative nausea and vomiting)   . MVP (mitral valve prolapse)   . Hypertension   . Anxiety   . Shortness of breath     at times "anxiety"  . Ovarian cancer on right   . Regional lymph node metastasis present   . Mental disorder     Family Hx:  Family History  Problem Relation Age of Onset  . Heart attack Father   . Melanoma Maternal Uncle     dx in his 73s; mets to brain  . Dementia Maternal Grandmother   . COPD Maternal Grandfather   . Dementia Paternal Grandmother   . COPD Paternal Grandfather   . Testicular cancer Son 45    Vitals:  Blood pressure 145/78, pulse 98, temperature 98.4 F (36.9 C), temperature source Oral, resp. rate 16, height _0  (1.676 m), weight 143 lb 3.2 oz (64.955 kg).  Physical Exam:  General: Well developed, well nourished female in no acute  distress. Alert and oriented x 3.  Cardiovascular: Regular rate and rhythm. S1 and S2 normal.  Lungs: Clear to auscultation bilaterally. No wheezes/crackles/rhonchi noted.  Skin: No rashes or lesions present.  The abdomen consistent with Lovenox injections Pelvic: Normal external genitalia Bartholin's urethra Skene's gland.  The vagina is atrophic no discharge or bleeding vaginal cuff is intact no cul-de-sac masses Back: No CVA tenderness.  Extremities: No bilateral cyanosis, edema, or clubbing.

## 2014-06-15 ENCOUNTER — Ambulatory Visit: Payer: BC Managed Care – PPO | Attending: Gynecologic Oncology | Admitting: Gynecologic Oncology

## 2014-06-15 ENCOUNTER — Encounter: Payer: Self-pay | Admitting: Gynecologic Oncology

## 2014-06-15 ENCOUNTER — Ambulatory Visit: Payer: BC Managed Care – PPO

## 2014-06-15 VITALS — BP 164/86 | HR 82 | Temp 98.4°F

## 2014-06-15 VITALS — BP 159/91 | HR 80 | Temp 98.5°F | Resp 22 | Ht 66.0 in | Wt 152.1 lb

## 2014-06-15 DIAGNOSIS — Z8043 Family history of malignant neoplasm of testis: Secondary | ICD-10-CM | POA: Diagnosis not present

## 2014-06-15 DIAGNOSIS — Z90710 Acquired absence of both cervix and uterus: Secondary | ICD-10-CM | POA: Insufficient documentation

## 2014-06-15 DIAGNOSIS — Z95828 Presence of other vascular implants and grafts: Secondary | ICD-10-CM

## 2014-06-15 DIAGNOSIS — C779 Secondary and unspecified malignant neoplasm of lymph node, unspecified: Secondary | ICD-10-CM | POA: Insufficient documentation

## 2014-06-15 DIAGNOSIS — C563 Malignant neoplasm of bilateral ovaries: Secondary | ICD-10-CM

## 2014-06-15 DIAGNOSIS — C562 Malignant neoplasm of left ovary: Secondary | ICD-10-CM

## 2014-06-15 DIAGNOSIS — Z9079 Acquired absence of other genital organ(s): Secondary | ICD-10-CM | POA: Diagnosis not present

## 2014-06-15 DIAGNOSIS — C561 Malignant neoplasm of right ovary: Secondary | ICD-10-CM

## 2014-06-15 DIAGNOSIS — C569 Malignant neoplasm of unspecified ovary: Secondary | ICD-10-CM | POA: Diagnosis not present

## 2014-06-15 DIAGNOSIS — I10 Essential (primary) hypertension: Secondary | ICD-10-CM | POA: Diagnosis not present

## 2014-06-15 MED ORDER — SODIUM CHLORIDE 0.9 % IJ SOLN
10.0000 mL | INTRAMUSCULAR | Status: DC | PRN
  Administered 2014-06-15: 10 mL via INTRAVENOUS
  Filled 2014-06-15: qty 10

## 2014-06-15 MED ORDER — HEPARIN SOD (PORK) LOCK FLUSH 100 UNIT/ML IV SOLN
500.0000 [IU] | Freq: Once | INTRAVENOUS | Status: AC
  Administered 2014-06-15: 500 [IU] via INTRAVENOUS
  Filled 2014-06-15: qty 5

## 2014-06-15 NOTE — Patient Instructions (Signed)
Follow-up in 3 months   Thank you very much Ms. Deborah Macdonald for allowing me to provide care for you today.  I appreciate your confidence in choosing our Gynecologic Oncology team.  If you have any questions about your visit today please call our office and we will get back to you as soon as possible.  Please consider using the website Medlineplus.gov as an Geneticist, molecular.   [name]. [name] MD., PhD Gynecologic Oncology

## 2014-06-15 NOTE — Progress Notes (Signed)
Office Visit:  [name] 58 y.o. female  CC:  Ovarian cancer surveillance  Assessment/Plan:  Deborah Macdonald is a 58 y.o. with stage IV B. ovarian carcinoma. The initial presentation was that of extra-abdominal lymphadenopathy, metastatic disease within the supraclavicular lymph nodes, and CA 125 elevated to 1202.4. She received 6 cycles of dose dense neoadjuvant Taxol platinum therapy.  On 07/26/2013 she she underwent total abdominal hysterectomy bilateral salpingo-oophorectomy and omentectomy with excision of peritoneal metastases. No gross residual disease was visible  at the completion of surgery. Final pathology is notable for serous carcinoma within the peritoneal nodule and the left ovary and uterine serosa. 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   I recommended that Deborah Macdonald receive an additional 3 cycles of dose dense Taxol carboplatin therapy. This was completed 10/26/2013   No evidence of disease.  Follow-up in 3 months   HPI:  Deborah Macdonald is a 58 y.o.  gravida 3 para 3, who was noted to have left cervical adenopathy in September 2013. She noted that it was a slowly increasing in size and the initial workup was for evaluation for lung disease. She underwent excision of supraclavicular LN on 01/21/2013 The malignant cells are positive for cytokeratin 7, estrogen receptor, progesterone receptor, and focally for p53. They are negative for cytokeratin 20, GCDFP, Napsin-A, , thyroglobulin, and TTF-1. This immunohistochemical profile raises the possibility of metastatic gynecologic carcinoma or metastatic breast carcinoma. The former is favored. 01/27/13 PET IMPRESSION: 1. 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 2. Bulky peritoneal metastasis in the upper abdomen and along the capsule of the liver. 3.  Hypermetabolic retroperitoneal nodal metastasis. 4. Small hypermetabolic mediastinal nodal metastasis. 5. Supraclavicular hypermetabolic nodal metastasis.    She completed six cycles of carbo/taxol, last on 06/16/13.  CT chest/abdomen/pelvis on 05/09/13 prior to completion of chemotherapy noted  1. Near complete resolution of the bulky the peritoneal implants. 2. Marked reduction in size of periaortic adenopathy with lymph nodes now less than 5 mm. 3. Marked reduction in the volume of the cystic and solid pelvic mass with now only a normal volume of right ovarian tissue measurable. 1. No evidence of disease progression in the thorax. 2. Interval decrease in size of mediastinal and supraclavicular lymphadenopathy.  CA 125 on 05/11/13 was 39.9.  On 07/26/2013 she underwent exploratory laparotomy total abdominal hysterectomy bilateral salpingo-oophorectomy omentectomy with and peritoneal excision.  Gross disease was notable only within the cul-de-sac and this was removed. Final pathology was consistent with serous carcinoma with associated fibrosis and psammoma bodies in the soft tissue biopsies of the peritoneum. Residual serous carcinoma was noted on the left ovary and the uterine serosa. Psammoma bodies were reported on the right ovary and bilateral fallopian tubes and also within the omentum.  Genetic testing (panel) negative  Postoperatively she received an additional 6 cycles taxol/Platin completed 10/2013. CT 11/23/2013 IMPRESSION:  1. Interval hysterectomy. No evidence of residual or metastatic  disease.  2. Cholelithiasis  Lab Results  Component Value Date   CA125 11.7 03/14/2014     Review of Systems  Constitutional: Feels well.  No fever, chills, early satiety, or unintentional weight loss or gain.  Cardiovascular: No chest pain, shortness of breath, or edema.  Pulmonary: No cough or wheeze.  Gastrointestinal: No nausea, vomiting, or diarrhea. No bright red blood per rectum or change in  bowel movement.  Genitourinary: No frequency, urgency, or dysuria. No vaginal bleeding or discharge.  Musculoskeletal: No myalgia or joint pain. Neurologic: No weakness, numbness, or change in gait.  Psychology: No depression, has a lot of anxiety about recurrence.  Social Hx:  Planning to volunteer her time to a cancer related cause.   Past Surgical Hx:  Past Surgical History  Procedure Laterality Date  . Wisdom tooth extraction    . Lymph node biopsy Left 01/21/2013    Procedure: EXCISIONAL BIOPSY OF THE NECK;  Surgeon: Jefry Rosen, MD;  Location: [facility];  Service: ENT;  Laterality: Left;  . Leep      20 year ago  . Vascular access device insertion Right     port a cath   . Abdominal hysterectomy Bilateral 07/26/2013    Procedure: EXPLORATORY LAPAROTOMY, TOTAL ABDOMINAL HYSTERECTOMY ,BILATERAL SALPINGO OOPHORECTOMY , OMENTECTOMY    ;  Surgeon: Wendy Brewster, MD;  Location: [facility];  Service: Gynecology;  Laterality: Bilateral;    Past Medical Hx:  Past Medical History  Diagnosis Date  . PONV (postoperative nausea and vomiting)   . MVP (mitral valve prolapse)   . Hypertension   . Anxiety   . Shortness of breath     at times "anxiety"  . Ovarian cancer on right   . Regional lymph node metastasis present   . Mental disorder     Family Hx:  Family History  Problem Relation Age of Onset  . Heart attack Father   . Melanoma Maternal Uncle     dx in his 60s; mets to brain  . Dementia Maternal Grandmother   . COPD Maternal Grandfather   . Dementia Paternal Grandmother   . COPD Paternal Grandfather   . Testicular cancer Son 31    Vitals:  Blood pressure 159/91, pulse 80, temperature 98.5 F (36.9 C), temperature source Oral, resp. rate 22, height 5' 6" (1.676 m), weight 152 lb 1.6 oz (68.992 kg).  Physical Exam:  General: Well developed, well nourished female in no acute distress. Alert and oriented x 3.  Cardiovascular: Regular rate and rhythm. S1 and S2 normal.  Lungs:  Clear to auscultation bilaterally. No wheezes/crackles/rhonchi noted.  Skin: No rashes or lesions present.    Pelvic: Normal external genitalia Bartholin's urethra Skene's gland.  The vagina is atrophic no discharge or bleeding vaginal cuff is intact no cul-de-sac masses Rectal:  Good tone no masses, no tenderness Back: No CVA tenderness.  Extremities: No bilateral cyanosis, edema, or clubbing.  LN:  No cervical supra clavicular or inguinal adenopathy 

## 2014-06-15 NOTE — Patient Instructions (Signed)

## 2014-09-11 IMAGING — CR DG FOOT COMPLETE 3+V*L*
3 series · 3 of 3 positions shown · non-contrast
Comparison: None.

CLINICAL DATA: Foot injury. Soft tissue swelling along the lateral
metatarsals.

EXAM:
LEFT FOOT - COMPLETE 3+ VIEW

[view not recorded (1 of 3)]
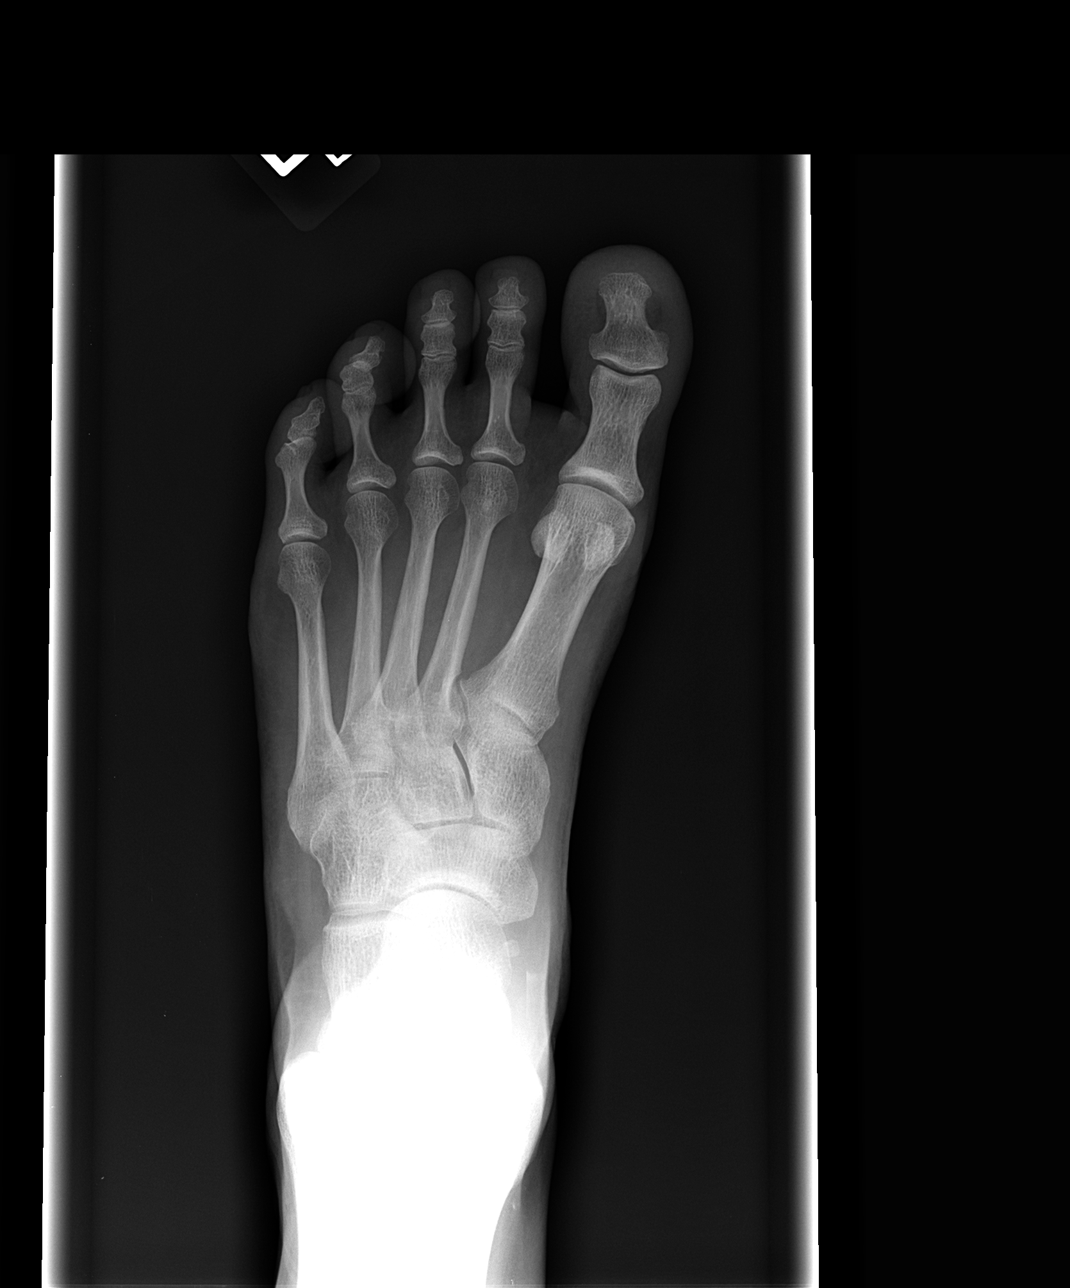

[view not recorded (2 of 3)]
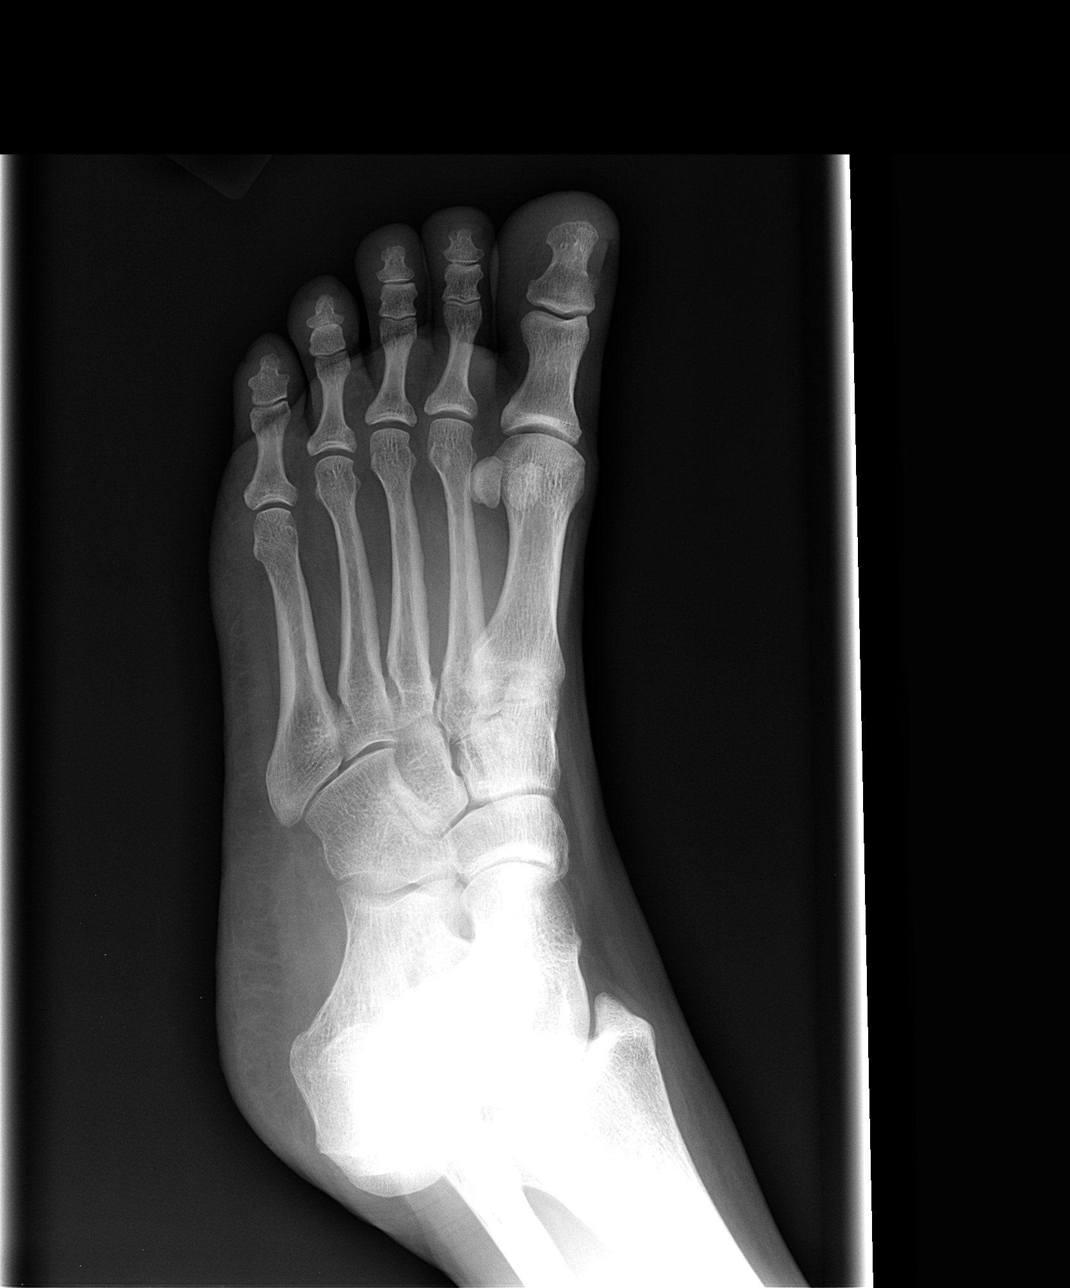

[view not recorded (3 of 3)]
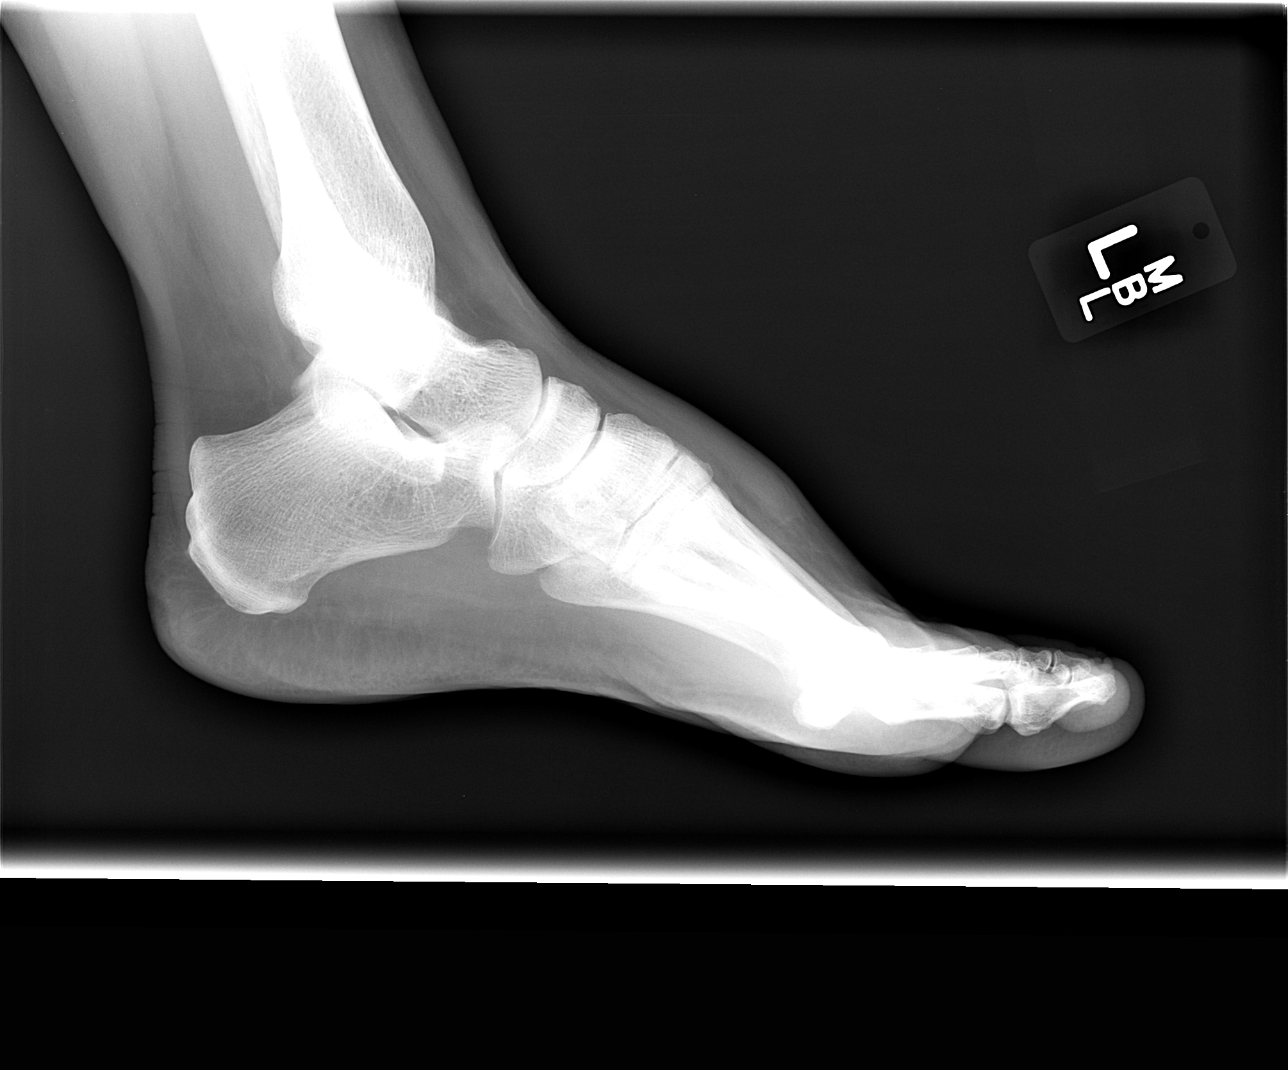

[3 of 3 positions shown; findings below may reference images not displayed]

FINDINGS: Mild lateral spurring at the base of the first metatarsal. Small
accessory navicular separated from the main navicular by 4 mm.

No malalignment at the Lisfranc joint is identified. Is there is
soft tissue swelling overlying the Lisfranc joint.
IMPRESSION: 1. I do not observe a definite fracture, but there is some soft
tissue swelling dorsal to the Lisfranc joint. No malalignment
observed.
2. Small accessory navicular separated from the main navicular by 4
mm. This is probably incidental, but correlate with any medial
tenderness along the tibialis posterior tendon/medial ankle.

## 2014-09-13 ENCOUNTER — Telehealth: Payer: Self-pay | Admitting: Gynecologic Oncology

## 2014-09-13 NOTE — Telephone Encounter (Signed)
Office Visit:  Deborah Macdonald 58 y.o. female  CC:  Ovarian cancer surveillance  Assessment/Plan:  Deborah Macdonald is a 58 y.o. with stage IV B. ovarian carcinoma. The initial presentation was that of extra-abdominal lymphadenopathy, metastatic disease within the supraclavicular lymph nodes, and CA 125 elevated to 1202.4. She received 6 cycles of dose dense neoadjuvant Taxol platinum therapy.  On 07/26/2013 she she underwent total abdominal hysterectomy bilateral salpingo-oophorectomy and omentectomy with excision of peritoneal metastases. No gross residual disease was visible  at the completion of surgery. Final pathology is notable for serous carcinoma within the peritoneal nodule and the left ovary and uterine serosa. 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   I recommended that Deborah Macdonald receive an additional 3 cycles of dose dense Taxol carboplatin therapy. This was completed 10/26/2013   No evidence of disease.  Follow-up in 3 months   HPI:  Deborah Macdonald is a 58 y.o.  gravida 3 para 3, who was noted to have left cervical adenopathy in September 2013. She noted that it was a slowly increasing in size and the initial workup was for evaluation for lung disease. She underwent excision of supraclavicular LN on 01/21/2013 The malignant cells are positive for cytokeratin 7, estrogen receptor, progesterone receptor, and focally for p53. They are negative for cytokeratin 20, GCDFP, Napsin-A, , thyroglobulin, and TTF-1. This immunohistochemical profile raises the possibility of metastatic gynecologic carcinoma or metastatic breast carcinoma. The former is favored. 01/27/13 PET IMPRESSION: 1. 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 2. Bulky peritoneal metastasis in the upper abdomen and along the capsule of the liver. 3.  Hypermetabolic retroperitoneal nodal metastasis. 4. Small hypermetabolic mediastinal nodal metastasis. 5. Supraclavicular hypermetabolic nodal metastasis.    She completed six cycles of carbo/taxol, last on 06/16/13.  CT chest/abdomen/pelvis on 05/09/13 prior to completion of chemotherapy noted  1. Near complete resolution of the bulky the peritoneal implants. 2. Marked reduction in size of periaortic adenopathy with lymph nodes now less than 5 mm. 3. Marked reduction in the volume of the cystic and solid pelvic mass with now only a normal volume of right ovarian tissue measurable. 1. No evidence of disease progression in the thorax. 2. Interval decrease in size of mediastinal and supraclavicular lymphadenopathy.  CA 125 on 05/11/13 was 39.9.  On 07/26/2013 she underwent exploratory laparotomy total abdominal hysterectomy bilateral salpingo-oophorectomy omentectomy with and peritoneal excision.  Gross disease was notable only within the cul-de-sac and this was removed. Final pathology was consistent with serous carcinoma with associated fibrosis and psammoma bodies in the soft tissue biopsies of the peritoneum. Residual serous carcinoma was noted on the left ovary and the uterine serosa. Psammoma bodies were reported on the right ovary and bilateral fallopian tubes and also within the omentum.  Genetic testing (panel) negative  Postoperatively she received an additional 6 cycles taxol/Platin completed 10/2013. CT 11/23/2013 IMPRESSION:  1. Interval hysterectomy. No evidence of residual or metastatic  disease.  2. Cholelithiasis  Lab Results  Component Value Date   CA125 11.7 03/14/2014     Review of Systems  Constitutional: Feels well.  No fever, chills, early satiety, or unintentional weight loss or gain.  Cardiovascular: No chest pain, shortness of breath, or edema.  Pulmonary: No cough or wheeze.  Gastrointestinal: No nausea, vomiting, or diarrhea. No bright red blood per rectum or change in  bowel movement.  Genitourinary: No frequency, urgency, or dysuria. No vaginal bleeding or discharge.  Musculoskeletal: No myalgia or joint pain. Neurologic: No weakness, numbness, or change in gait.  Psychology: No depression, has a lot of anxiety about recurrence.  Social Hx:  Planning to volunteer her time to a cancer related cause.   Past Surgical Hx:  Past Surgical History  Procedure Laterality Date  . Wisdom tooth extraction    . Lymph node biopsy Left 01/21/2013    Procedure: EXCISIONAL BIOPSY OF THE NECK;  Surgeon: Jefry Rosen, MD;  Location: [facility];  Service: ENT;  Laterality: Left;  . Leep      20 year ago  . Vascular access device insertion Right     port a cath   . Abdominal hysterectomy Bilateral 07/26/2013    Procedure: EXPLORATORY LAPAROTOMY, TOTAL ABDOMINAL HYSTERECTOMY ,BILATERAL SALPINGO OOPHORECTOMY , OMENTECTOMY    ;  Surgeon: Basma Buchner, MD;  Location: [facility];  Service: Gynecology;  Laterality: Bilateral;    Past Medical Hx:  Past Medical History  Diagnosis Date  . PONV (postoperative nausea and vomiting)   . MVP (mitral valve prolapse)   . Hypertension   . Anxiety   . Shortness of breath     at times "anxiety"  . Ovarian cancer on right   . Regional lymph node metastasis present   . Mental disorder     Family Hx:  Family History  Problem Relation Age of Onset  . Heart attack Father   . Melanoma Maternal Uncle     dx in his 60s; mets to brain  . Dementia Maternal Grandmother   . COPD Maternal Grandfather   . Dementia Paternal Grandmother   . COPD Paternal Grandfather   . Testicular cancer Son 31    Vitals:  Blood pressure 159/91, pulse 80, temperature 98.5 F (36.9 C), temperature source Oral, resp. rate 22, height 5' 6" (1.676 m), weight 152 lb 1.6 oz (68.992 kg).  Physical Exam:  General: Well developed, well nourished female in no acute distress. Alert and oriented x 3.  Cardiovascular: Regular rate and rhythm. S1 and S2 normal.  Lungs:  Clear to auscultation bilaterally. No wheezes/crackles/rhonchi noted.  Skin: No rashes or lesions present.    Pelvic: Normal external genitalia Bartholin's urethra Skene's gland.  The vagina is atrophic no discharge or bleeding vaginal cuff is intact no cul-de-sac masses Rectal:  Good tone no masses, no tenderness Back: No CVA tenderness.  Extremities: No bilateral cyanosis, edema, or clubbing.  LN:  No cervical supra clavicular or inguinal adenopathy 

## 2014-09-14 ENCOUNTER — Ambulatory Visit: Payer: BC Managed Care – PPO

## 2014-09-14 ENCOUNTER — Encounter: Payer: Self-pay | Admitting: Gynecologic Oncology

## 2014-09-14 ENCOUNTER — Ambulatory Visit: Payer: BC Managed Care – PPO | Attending: Gynecologic Oncology | Admitting: Gynecologic Oncology

## 2014-09-14 VITALS — BP 158/81 | HR 92 | Temp 98.2°F | Resp 20 | Ht 66.0 in | Wt 157.5 lb

## 2014-09-14 DIAGNOSIS — C786 Secondary malignant neoplasm of retroperitoneum and peritoneum: Secondary | ICD-10-CM | POA: Diagnosis not present

## 2014-09-14 DIAGNOSIS — C770 Secondary and unspecified malignant neoplasm of lymph nodes of head, face and neck: Secondary | ICD-10-CM | POA: Insufficient documentation

## 2014-09-14 DIAGNOSIS — Z825 Family history of asthma and other chronic lower respiratory diseases: Secondary | ICD-10-CM | POA: Diagnosis not present

## 2014-09-14 DIAGNOSIS — I10 Essential (primary) hypertension: Secondary | ICD-10-CM | POA: Diagnosis not present

## 2014-09-14 DIAGNOSIS — Z9221 Personal history of antineoplastic chemotherapy: Secondary | ICD-10-CM | POA: Insufficient documentation

## 2014-09-14 DIAGNOSIS — Z8249 Family history of ischemic heart disease and other diseases of the circulatory system: Secondary | ICD-10-CM | POA: Diagnosis not present

## 2014-09-14 DIAGNOSIS — Z808 Family history of malignant neoplasm of other organs or systems: Secondary | ICD-10-CM | POA: Insufficient documentation

## 2014-09-14 DIAGNOSIS — I341 Nonrheumatic mitral (valve) prolapse: Secondary | ICD-10-CM | POA: Insufficient documentation

## 2014-09-14 DIAGNOSIS — F419 Anxiety disorder, unspecified: Secondary | ICD-10-CM | POA: Insufficient documentation

## 2014-09-14 DIAGNOSIS — Z90710 Acquired absence of both cervix and uterus: Secondary | ICD-10-CM | POA: Diagnosis not present

## 2014-09-14 DIAGNOSIS — Z95828 Presence of other vascular implants and grafts: Secondary | ICD-10-CM

## 2014-09-14 DIAGNOSIS — Z8543 Personal history of malignant neoplasm of ovary: Secondary | ICD-10-CM

## 2014-09-14 DIAGNOSIS — C569 Malignant neoplasm of unspecified ovary: Secondary | ICD-10-CM | POA: Insufficient documentation

## 2014-09-14 MED ORDER — SODIUM CHLORIDE 0.9 % IJ SOLN
10.0000 mL | INTRAMUSCULAR | Status: DC | PRN
  Administered 2014-09-14: 10 mL via INTRAVENOUS
  Filled 2014-09-14: qty 10

## 2014-09-14 MED ORDER — HEPARIN SOD (PORK) LOCK FLUSH 100 UNIT/ML IV SOLN
500.0000 [IU] | Freq: Once | INTRAVENOUS | Status: AC
  Administered 2014-09-14: 500 [IU] via INTRAVENOUS
  Filled 2014-09-14: qty 5

## 2014-09-14 NOTE — Progress Notes (Signed)
Office Visit:  [name] 58 y.o. female  CC:  Ovarian cancer surveillance  Assessment/Plan:  Deborah Macdonald is a 58 y.o. with stage IV B. ovarian carcinoma. The initial presentation was that of extra-abdominal lymphadenopathy, metastatic disease within the supraclavicular lymph nodes, and CA 125 elevated to 1202.4. She received 6 cycles of dose dense neoadjuvant Taxol platinum therapy.  On 07/26/2013 she she underwent total abdominal hysterectomy bilateral salpingo-oophorectomy and omentectomy with excision of peritoneal metastases. No gross residual disease was visible  at the completion of surgery.  However, final pathology was notable for serous carcinoma within the peritoneal nodule and the left ovary and uterine serosa. 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   I recommended that [name] receive an additional 3 cycles of dose dense Taxol carboplatin therapy. This was completed 10/26/2013   No evidence of disease.  Follow-up in 3 months  Repeat pap with hrHPV at next visit   HPI:  Deborah Macdonald is a 58 y.o.  gravida 3 para 3, who was noted to have left cervical adenopathy in September 2013. She noted that it was a slowly increasing in size and the initial workup was for evaluation for lung disease. She underwent excision of supraclavicular LN on 01/21/2013 The malignant cells are positive for cytokeratin 7, estrogen receptor, progesterone receptor, and focally for p53. They are negative for cytokeratin 20, GCDFP, Napsin-A, , thyroglobulin, and TTF-1. This immunohistochemical profile raises the possibility of metastatic gynecologic carcinoma or metastatic breast carcinoma. The former is favored. 01/27/13 PET IMPRESSION: 1. 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 2. Bulky peritoneal metastasis in the upper abdomen  and along the capsule of the liver. 3. Hypermetabolic retroperitoneal nodal metastasis. 4. Small hypermetabolic mediastinal nodal metastasis. 5. Supraclavicular hypermetabolic nodal metastasis.    She completed six cycles of carbo/taxol, last on 06/16/13.  CT chest/abdomen/pelvis on 05/09/13 prior to completion of chemotherapy noted  1. Near complete resolution of the bulky the peritoneal implants. 2. Marked reduction in size of periaortic adenopathy with lymph nodes now less than 5 mm. 3. Marked reduction in the volume of the cystic and solid pelvic mass with now only a normal volume of right ovarian tissue measurable. 1. No evidence of disease progression in the thorax. 2. Interval decrease in size of mediastinal and supraclavicular lymphadenopathy.  CA 125 on 05/11/13 was 39.9.  On 07/26/2013 she underwent exploratory laparotomy total abdominal hysterectomy bilateral salpingo-oophorectomy omentectomy with and peritoneal excision.  Gross disease was notable only within the cul-de-sac and this was removed. Final pathology was consistent with serous carcinoma with associated fibrosis and psammoma bodies in the soft tissue biopsies of the peritoneum. Residual serous carcinoma was noted on the left ovary and the uterine serosa. Psammoma bodies were reported on the right ovary and bilateral fallopian tubes and also within the omentum.  Genetic testing (panel) negative  Postoperatively she received an additional 6 cycles taxol/Platin completed 10/2013. CT 11/23/2013 IMPRESSION:  1. Interval hysterectomy. No evidence of residual or metastatic  disease.  2. Cholelithiasis  Lab Results  Component Value Date   CA125 11.7 03/14/2014   Is doing well.  Has increased her physical activity. (H/o abn pap 20 years ago treated with cryotherapy)   Review of Systems  Constitutional: Feels well.  No fever, chills, early satiety, continues to gain weight Cardiovascular: No chest pain, shortness of breath,  or edema.   Pulmonary: No cough or wheeze.  Gastrointestinal: No nausea, vomiting, or diarrhea. No bright red blood per rectum or change in bowel movement.  Genitourinary: No frequency, urgency, or dysuria. No vaginal bleeding or discharge.  Musculoskeletal: No myalgia or joint pain. Neurologic: No weakness, numbness, or change in gait.  Psychology: No depression, has a lot of anxiety about recurrence.  Social Hx:  Family doing well.  Has increased her physical activity  Past Surgical Hx:  Past Surgical History  Procedure Laterality Date  . Wisdom tooth extraction    . Lymph node biopsy Left 01/21/2013    Procedure: EXCISIONAL BIOPSY OF THE NECK;  Surgeon: Izora Gala, MD;  Location: [place];  Service: ENT;  Laterality: Left;  . Leep      20 year ago  . Vascular access device insertion Right     port a cath   . Abdominal hysterectomy Bilateral 07/26/2013    Procedure: EXPLORATORY LAPAROTOMY, TOTAL ABDOMINAL HYSTERECTOMY ,BILATERAL SALPINGO OOPHORECTOMY , OMENTECTOMY    ;  Surgeon: Janie Morning, MD;  Location: [facility];  Service: Gynecology;  Laterality: Bilateral;    Past Medical Hx:  Past Medical History  Diagnosis Date  . PONV (postoperative nausea and vomiting)   . MVP (mitral valve prolapse)   . Hypertension   . Anxiety   . Shortness of breath     at times "anxiety"  . Ovarian cancer on right   . Regional lymph node metastasis present   . Mental disorder     Family Hx:  Family History  Problem Relation Age of Onset  . Heart attack Father   . Melanoma Maternal Uncle     dx in his 75s; mets to brain  . Dementia Maternal Grandmother   . COPD Maternal Grandfather   . Dementia Paternal Grandmother   . COPD Paternal Grandfather   . Testicular cancer Son 20    Vitals:  Blood pressure 158/81, pulse 92, temperature 98.2 F (36.8 C), temperature source Oral, resp. rate 20, height 5' 6"  (1.676 m), weight 157 lb 8 oz (71.442 kg). Wt Readings from Last 3 Encounters:  09/14/14 157 lb 8  oz (71.442 kg)  06/15/14 152 lb 1.6 oz (68.992 kg)  11/24/13 152 lb 11.2 oz (69.264 kg)    Physical Exam:  General: Well developed, well nourished female in no acute distress. Alert and oriented x 3.  Cardiovascular: Regular rate and rhythm. S1 and S2 normal.  Lungs: Clear to auscultation bilaterally. No wheezes/crackles/rhonchi noted.  Skin: No rashes or lesions present.    Pelvic: Normal external genitalia Bartholin's urethra Skene's gland.  No cul-de-sac masses Rectal:  Good tone no masses, no tenderness Back: No CVA tenderness.  Extremities: No bilateral cyanosis, edema, or clubbing.  LN:  No cervical supra clavicular or inguinal adenopathy

## 2014-09-14 NOTE — Patient Instructions (Signed)

## 2014-09-14 NOTE — Patient Instructions (Signed)
Follow up in 3 months    Thank you very much Ms. Deborah Macdonald for allowing me to provide care for you today.  I appreciate your confidence in choosing our Gynecologic Oncology team.  If you have any questions about your visit today please call our office and we will get back to you as soon as possible.  Please consider using the website Medlineplus.gov as an Geneticist, molecular.   HAPPY HOLIDAYS!  [name]. Aloria Looper MD., PhD Gynecologic Oncology

## 2014-09-27 ENCOUNTER — Emergency Department (HOSPITAL_COMMUNITY)
Admission: EM | Admit: 2014-09-27 | Discharge: 2014-09-27 | Disposition: A | Discharge status: Home / Self Care | Payer: BC Managed Care – PPO | Source: Home / Self Care | Attending: Emergency Medicine | Admitting: Emergency Medicine

## 2014-09-27 ENCOUNTER — Encounter (HOSPITAL_COMMUNITY): Payer: Self-pay | Admitting: Emergency Medicine

## 2014-09-27 DIAGNOSIS — J029 Acute pharyngitis, unspecified: Secondary | ICD-10-CM

## 2014-09-27 LAB — POCT RAPID STREP A: STREPTOCOCCUS, GROUP A SCREEN (DIRECT): NEGATIVE

## 2014-09-27 MED ORDER — DICLOFENAC SODIUM 75 MG PO TBEC: 75.0000 mg | DELAYED_RELEASE_TABLET | Freq: Two times a day (BID) | ORAL | Status: DC

## 2014-09-27 NOTE — ED Notes (Signed)
Pt states she has been suffering from a sore throat since Sunday.  It is mainly on the right side.  She denies any fever or any other symptoms.

## 2014-09-27 NOTE — ED Provider Notes (Signed)
   Chief Complaint   Sore Throat   History of Present Illness   Deborah Macdonald is a 58 year old female who has had a four-day history of sore throat on the right side and pain with swallowing. She denies any fever, chills, headache, nasal congestion, rhinorrhea, adenopathy, cough, or GI symptoms. No known sick exposures and she did have strep back in July.   Review of Systems   Other than as noted above, the patient denies any of the following symptoms. Systemic:  No fever, chills, sweats, myalgias, or headache. Eye:  No redness, pain or drainage. ENT:  No earache, nasal congestion, sneezing, rhinorrhea, sinus pressure, sinus pain, or post nasal drip. Lungs:  No cough, sputum production, wheezing, shortness of breath, or chest pain. GI:  No abdominal pain, nausea, vomiting, or diarrhea. Skin:  No rash.  [place]   Past medical history, family history, social history, meds, and allergies were reviewed. She is allergic to codeine. She takes Xanax, Prozac, blood pressure pill. She has high blood pressure and a history of ovarian cancer.  Physical Exam     Vital signs:  BP 133/87 mmHg  Pulse 101  Temp(Src) 98.3 F (36.8 C) (Oral)  Resp 16  SpO2 97% General:  Alert, in no distress. Phonation was normal, no drooling, and patient was able to handle secretions well.  Eye:  No conjunctival injection or drainage. Lids were normal. ENT:  TMs and canals were normal, without erythema or inflammation.  Nasal mucosa was clear and uncongested, without drainage.  Mucous membranes were moist.  Exam of pharynx shows moderate erythema on the right, no swelling, no ulcerations, no exudate.  There were no oral ulcerations or lesions. There was no bulging of the tonsillar pillars, and the uvula was midline. Neck:  Supple, no adenopathy, tenderness or mass. Lungs:  No respiratory distress.  Lungs were clear to auscultation, without wheezes, rales or rhonchi.  Breath sounds were clear and equal bilaterally.    Heart:  Regular rhythm, without gallops, murmers or rubs. Skin:  Clear, warm, and dry, without rash or lesions.  Labs   Results for orders placed or performed during the hospital encounter of 09/27/14  POCT rapid strep A [facility])  Result Value Ref Range   Streptococcus, Group A Screen (Direct) NEGATIVE NEGATIVE   Assessment   The encounter diagnosis was Viral pharyngitis.  There is no evidence of a peritonsillar abscess, retropharyngeal abscess, or epiglottitis.   Plan     1.  Meds:  The following meds were prescribed:   New Prescriptions   DICLOFENAC (VOLTAREN) 75 MG EC TABLET    Take 1 tablet (75 mg total) by mouth 2 (two) times daily.    2.  Patient Education/Counseling:  The patient was given appropriate handouts, self care instructions, and instructed in symptomatic relief, including hot saline gargles, throat lozenges, infectious precautions, and need to trade out toothbrush.    3.  Follow up:  The patient was told to follow up here if no better in 3 to 4 days, or sooner if becoming worse in any way, and given some red flag symptoms such as difficulty swallowing or breathing which would prompt immediate return.       Harden Mo, MD 09/27/14 [phone]

## 2014-09-27 NOTE — Discharge Instructions (Signed)
Pharyngitis °Pharyngitis is redness, pain, and swelling (inflammation) of your pharynx.  °CAUSES  °Pharyngitis is usually caused by infection. Most of the time, these infections are from viruses (viral) and are part of a cold. However, sometimes pharyngitis is caused by bacteria (bacterial). Pharyngitis can also be caused by allergies. Viral pharyngitis Dickerman be spread from person to person by coughing, sneezing, and personal items or utensils (cups, forks, spoons, toothbrushes). Bacterial pharyngitis Sarti be spread from person to person by more intimate contact, such as kissing.  °SIGNS AND SYMPTOMS  °Symptoms of pharyngitis include:   °· Sore throat.   °· Tiredness (fatigue).   °· Low-grade fever.   °· Headache. °· Joint pain and muscle aches. °· Skin rashes. °· Swollen lymph nodes. °· Plaque-like film on throat or tonsils (often seen with bacterial pharyngitis). °DIAGNOSIS  °Your health care provider will ask you questions about your illness and your symptoms. Your medical history, along with a physical exam, is often all that is needed to diagnose pharyngitis. Sometimes, a rapid strep test is done. Other lab tests [place] also be done, depending on the suspected cause.  °TREATMENT  °Viral pharyngitis will usually get better in 3-4 days without the use of medicine. Bacterial pharyngitis is treated with medicines that kill germs (antibiotics).  °HOME CARE INSTRUCTIONS  °· Drink enough water and fluids to keep your urine clear or pale yellow.   °· Only take over-the-counter or prescription medicines as directed by your health care provider:   °¨ If you are prescribed antibiotics, make sure you finish them even if you start to feel better.   °¨ Do not take aspirin.   °· Get lots of rest.   °· Gargle with 8 oz of salt water (½ tsp of salt per 1 qt of water) as often as every 1-2 hours to soothe your throat.   °· Throat lozenges (if you are not at risk for choking) or sprays [place] be used to soothe your throat. °SEEK MEDICAL  CARE IF:  °· You have large, tender lumps in your neck. °· You have a rash. °· You cough up green, yellow-brown, or bloody spit. °SEEK IMMEDIATE MEDICAL CARE IF:  °· Your neck becomes stiff. °· You drool or are unable to swallow liquids. °· You vomit or are unable to keep medicines or liquids down. °· You have severe pain that does not go away with the use of recommended medicines. °· You have trouble breathing (not caused by a stuffy nose). °MAKE SURE YOU:  °· Understand these instructions. °· Will watch your condition. °· Will get help right away if you are not doing well or get worse. °Document Released: 09/29/2005 Document Revised: 07/20/2013 Document Reviewed: 06/06/2013 °ExitCare® Patient Information ©2015 [employer]. This information is not intended to replace advice given to you by your health care provider. Make sure you discuss any questions you have with your health care provider. ° °

## 2014-09-29 LAB — CULTURE, GROUP A STREP

## 2014-09-29 NOTE — ED Notes (Signed)
Final report of strep testing: negative for group A&B. No further action required

## 2015-01-11 ENCOUNTER — Ambulatory Visit (HOSPITAL_BASED_OUTPATIENT_CLINIC_OR_DEPARTMENT_OTHER): Payer: BLUE CROSS/BLUE SHIELD

## 2015-01-11 ENCOUNTER — Other Ambulatory Visit (HOSPITAL_COMMUNITY)
Admission: RE | Admit: 2015-01-11 | Discharge: 2015-01-11 | Disposition: A | Discharge status: Home / Self Care | Payer: BLUE CROSS/BLUE SHIELD | Source: Ambulatory Visit | Attending: Gynecologic Oncology | Admitting: Gynecologic Oncology

## 2015-01-11 ENCOUNTER — Encounter: Payer: Self-pay | Admitting: Gynecologic Oncology

## 2015-01-11 ENCOUNTER — Telehealth: Payer: Self-pay | Admitting: Oncology

## 2015-01-11 ENCOUNTER — Ambulatory Visit: Payer: BLUE CROSS/BLUE SHIELD | Attending: Gynecologic Oncology | Admitting: Gynecologic Oncology

## 2015-01-11 VITALS — BP 139/88 | HR 97 | Temp 99.0°F | Resp 20

## 2015-01-11 VITALS — BP 147/86 | HR 91 | Temp 98.6°F | Resp 18 | Ht 66.0 in | Wt 157.6 lb

## 2015-01-11 DIAGNOSIS — C562 Malignant neoplasm of left ovary: Secondary | ICD-10-CM | POA: Diagnosis not present

## 2015-01-11 DIAGNOSIS — Z95828 Presence of other vascular implants and grafts: Secondary | ICD-10-CM

## 2015-01-11 DIAGNOSIS — Z01411 Encounter for gynecological examination (general) (routine) with abnormal findings: Secondary | ICD-10-CM | POA: Insufficient documentation

## 2015-01-11 DIAGNOSIS — Z1151 Encounter for screening for human papillomavirus (HPV): Secondary | ICD-10-CM | POA: Diagnosis present

## 2015-01-11 DIAGNOSIS — C561 Malignant neoplasm of right ovary: Secondary | ICD-10-CM | POA: Diagnosis present

## 2015-01-11 DIAGNOSIS — G4700 Insomnia, unspecified: Secondary | ICD-10-CM

## 2015-01-11 DIAGNOSIS — Z9221 Personal history of antineoplastic chemotherapy: Secondary | ICD-10-CM | POA: Diagnosis not present

## 2015-01-11 DIAGNOSIS — Z8543 Personal history of malignant neoplasm of ovary: Secondary | ICD-10-CM

## 2015-01-11 DIAGNOSIS — Z452 Encounter for adjustment and management of vascular access device: Secondary | ICD-10-CM

## 2015-01-11 DIAGNOSIS — C569 Malignant neoplasm of unspecified ovary: Secondary | ICD-10-CM

## 2015-01-11 MED ORDER — HEPARIN SOD (PORK) LOCK FLUSH 100 UNIT/ML IV SOLN
500.0000 [IU] | Freq: Once | INTRAVENOUS | Status: AC
Start: 2015-01-11 — End: 2015-01-11
  Administered 2015-01-11: 500 [IU] via INTRAVENOUS
  Filled 2015-01-11: qty 5

## 2015-01-11 MED ORDER — SODIUM CHLORIDE 0.9 % IJ SOLN
10.0000 mL | INTRAMUSCULAR | Status: DC | PRN
  Administered 2015-01-11: 10 mL via INTRAVENOUS
  Filled 2015-01-11: qty 10

## 2015-01-11 MED ORDER — ZOLPIDEM TARTRATE ER 6.25 MG PO TBCR
6.2500 mg | EXTENDED_RELEASE_TABLET | Freq: Every evening | ORAL | Status: DC | PRN
Start: 2015-01-11 — End: 2015-04-12

## 2015-01-11 MED ORDER — ZOLPIDEM TARTRATE 5 MG PO TABS: 5.0000 mg | ORAL_TABLET | Freq: Every evening | ORAL | Status: AC | PRN

## 2015-01-11 NOTE — Addendum Note (Signed)
Addended by: Christa See on: 01/11/2015 03:13 PM   Modules accepted: Orders

## 2015-01-11 NOTE — Telephone Encounter (Signed)
Appointments made per melissa/dr brewster and a new avs was printed for patient  Deborah Macdonald

## 2015-01-11 NOTE — Progress Notes (Signed)
Office Visit:  Deborah Macdonald 58 y.o. female  CC:  Ovarian cancer surveillance  Assessment/Plan:  Deborah Macdonald is a 59 y.o. with stage IV B. ovarian carcinoma. The initial presentation was that of extra-abdominal lymphadenopathy, metastatic disease within the supraclavicular lymph nodes, and CA 125 elevated to 1202.4. She received 6 cycles of dose dense neoadjuvant Taxol platinum therapy.  On 07/26/2013 she she underwent total abdominal hysterectomy bilateral salpingo-oophorectomy and omentectomy with excision of peritoneal metastases. No gross residual disease was visible  at the completion of surgery.  However, final pathology was notable for serous carcinoma within the peritoneal nodule and the left ovary and uterine serosa. 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   I recommended that Deborah Macdonald receive an additional 3 cycles of dose dense Taxol carboplatin therapy. This was completed 10/26/2013  No evidence of disease.  Follow-up in 3 months with Gyn Onc Follow-up with Dr. Marko Plume in 6 months  Abnormal pap 20 years ago hrHPV collected If double negative will stop pap testing    HPI:  Deborah Macdonald is a 59 y.o.  gravida 3 para 3, who was noted to have left cervical adenopathy in September 2013. She noted that it was a slowly increasing in size and the initial workup was for evaluation for lung disease. She underwent excision of supraclavicular LN on 01/21/2013 The malignant cells are positive for cytokeratin 7, estrogen receptor, progesterone receptor, and focally for p53. They are negative for cytokeratin 20, GCDFP, Napsin-A, , thyroglobulin, and TTF-1. This immunohistochemical profile raises the possibility of metastatic gynecologic carcinoma or metastatic breast carcinoma. The former is favored. 01/27/13 PET IMPRESSION: 1. Large complex cystic mass within the pelvis with intensely hypermetabolic nodular components is most  consistent with a epithelial ovarian adenocarcinoma. 2. Bulky peritoneal metastasis in the upper abdomen and along the capsule of the liver. 3. Hypermetabolic retroperitoneal nodal metastasis. 4. Small hypermetabolic mediastinal nodal metastasis. 5. Supraclavicular hypermetabolic nodal metastasis.    She completed six cycles of carbo/taxol, last on 06/16/13.  CT chest/abdomen/pelvis on 05/09/13 prior to completion of chemotherapy noted  1. Near complete resolution of the bulky the peritoneal implants. 2. Marked reduction in size of periaortic adenopathy with lymph nodes now less than 5 mm. 3. Marked reduction in the volume of the cystic and solid pelvic mass with now only a normal volume of right ovarian tissue measurable. 1. No evidence of disease progression in the thorax. 2. Interval decrease in size of mediastinal and supraclavicular lymphadenopathy.  CA 125 on 05/11/13 was 39.9.  On 07/26/2013 she underwent exploratory laparotomy total abdominal hysterectomy bilateral salpingo-oophorectomy omentectomy with and peritoneal excision.  Gross disease was notable only within the cul-de-sac and this was removed. Final pathology was consistent with serous carcinoma with associated fibrosis and psammoma bodies in the soft tissue biopsies of the peritoneum. Residual serous carcinoma was noted on the left ovary and the uterine serosa. Psammoma bodies were reported on the right ovary and bilateral fallopian tubes and also within the omentum.  Genetic testing (panel) negative  Postoperatively she received an additional 6 cycles taxol/Platin completed 10/2013. CT 11/23/2013 IMPRESSION:  1. Interval hysterectomy. No evidence of residual or metastatic  disease.  2. Cholelithiasis  Lab Results  Component Value Date   CA125 11.7 03/14/2014   Is doing well.  Has increased her physical activity. (H/o abn pap 20 years ago treated with cryotherapy)   Review of Systems  Constitutional: Feels well.  No fever,  chills, early satiety, continues to gain weight Cardiovascular: No chest pain, shortness of breath, or edema.  Pulmonary: No cough or wheeze.  Gastrointestinal: No nausea, vomiting, or diarrhea. No bright red blood per rectum or change in bowel movement.  Genitourinary: No frequency, urgency, or dysuria. No vaginal bleeding or discharge.  Musculoskeletal: No myalgia or joint pain. Neurologic: No weakness, numbness, or change in gait.  Psychology: No depression, has anxiety about recurrence.  Social Hx:  Family doing well.  Has increased her physical activity.  Going to the beach this summer  Past Surgical Hx:  Past Surgical History  Procedure Laterality Date  . Wisdom tooth extraction    . Lymph node biopsy Left 01/21/2013    Procedure: EXCISIONAL BIOPSY OF THE NECK;  Surgeon: Izora Gala, MD;  Location: [place];  Service: ENT;  Laterality: Left;  . Leep      20 year ago  . Vascular access device insertion Right     port a cath   . Abdominal hysterectomy Bilateral 07/26/2013    Procedure: EXPLORATORY LAPAROTOMY, TOTAL ABDOMINAL HYSTERECTOMY ,BILATERAL SALPINGO OOPHORECTOMY , OMENTECTOMY    ;  Surgeon: Janie Morning, MD;  Location: [facility];  Service: Gynecology;  Laterality: Bilateral;    Past Medical Hx:  Past Medical History  Diagnosis Date  . PONV (postoperative nausea and vomiting)   . MVP (mitral valve prolapse)   . Hypertension   . Anxiety   . Shortness of breath     at times "anxiety"  . Ovarian cancer on right   . Regional lymph node metastasis present   . Mental disorder   Colonoscopy 2013 Mammogram to be scheduled for 01/2015  Family Hx:  Family History  Problem Relation Age of Onset  . Heart attack Father   . Melanoma Maternal Uncle     dx in his 34s; mets to brain  . Dementia Maternal Grandmother   . COPD Maternal Grandfather   . Dementia Paternal Grandmother   . COPD Paternal Grandfather   . Testicular cancer Son 105    Vitals:  Blood pressure 147/86, pulse  91, temperature 98.6 F (37 C), temperature source Oral, resp. rate 18, height 5' 6"  (1.676 m), weight 157 lb 9.6 oz (71.487 kg). Wt Readings from Last 3 Encounters:  01/11/15 157 lb 9.6 oz (71.487 kg)  09/14/14 157 lb 8 oz (71.442 kg)  06/15/14 152 lb 1.6 oz (68.992 kg)    Physical Exam:  General: Well developed, well nourished female in no acute distress. Alert and oriented x 3.  Cardiovascular: Regular rate and rhythm. S1 and S2 normal.  Lungs: Clear to auscultation bilaterally. No wheezes/crackles/rhonchi noted.  Skin: No rashes or lesions present.    Pelvic: Normal external genitalia Bartholin's urethra Skene's gland.  No cul-de-sac masses Rectal:  Good tone no masses, no tenderness Back: No CVA tenderness.  Extremities: No bilateral cyanosis, edema, or clubbing.  LN:  No cervical supra clavicular or inguinal adenopathy

## 2015-01-11 NOTE — Patient Instructions (Signed)

## 2015-01-11 NOTE — Patient Instructions (Signed)
Followup in June with Dr. Skeet Latch and Dr. Marko Plume in September. We will call you with your pap smear results. Please call us sooner with any questions or concerns.

## 2015-01-16 LAB — CYTOLOGY - PAP

## 2015-01-17 ENCOUNTER — Telehealth: Payer: Self-pay | Admitting: *Deleted

## 2015-01-17 NOTE — Telephone Encounter (Signed)
Per Deborah John, NP patient notified of normal pap smear results. Patient very appreciative of call.

## 2015-02-13 ENCOUNTER — Other Ambulatory Visit: Payer: Self-pay

## 2015-02-13 DIAGNOSIS — Z1231 Encounter for screening mammogram for malignant neoplasm of breast: Secondary | ICD-10-CM

## 2015-02-22 ENCOUNTER — Ambulatory Visit
Admission: RE | Admit: 2015-02-22 | Discharge: 2015-02-22 | Disposition: A | Discharge status: Home / Self Care | Payer: BLUE CROSS/BLUE SHIELD | Source: Ambulatory Visit

## 2015-02-22 DIAGNOSIS — Z1231 Encounter for screening mammogram for malignant neoplasm of breast: Secondary | ICD-10-CM

## 2015-03-06 ENCOUNTER — Ambulatory Visit (HOSPITAL_BASED_OUTPATIENT_CLINIC_OR_DEPARTMENT_OTHER): Payer: BLUE CROSS/BLUE SHIELD

## 2015-03-06 VITALS — BP 116/97 | HR 86 | Temp 98.4°F

## 2015-03-06 DIAGNOSIS — C562 Malignant neoplasm of left ovary: Secondary | ICD-10-CM | POA: Diagnosis not present

## 2015-03-06 DIAGNOSIS — Z452 Encounter for adjustment and management of vascular access device: Secondary | ICD-10-CM

## 2015-03-06 DIAGNOSIS — Z95828 Presence of other vascular implants and grafts: Secondary | ICD-10-CM

## 2015-03-06 MED ORDER — HEPARIN SOD (PORK) LOCK FLUSH 100 UNIT/ML IV SOLN
500.0000 [IU] | Freq: Once | INTRAVENOUS | Status: AC
  Administered 2015-03-06: 500 [IU] via INTRAVENOUS
  Filled 2015-03-06: qty 5

## 2015-03-06 MED ORDER — SODIUM CHLORIDE 0.9 % IJ SOLN
10.0000 mL | INTRAMUSCULAR | Status: DC | PRN
  Administered 2015-03-06: 10 mL via INTRAVENOUS
  Filled 2015-03-06: qty 10

## 2015-03-06 NOTE — Patient Instructions (Signed)

## 2015-04-12 ENCOUNTER — Ambulatory Visit (HOSPITAL_BASED_OUTPATIENT_CLINIC_OR_DEPARTMENT_OTHER): Payer: BLUE CROSS/BLUE SHIELD

## 2015-04-12 ENCOUNTER — Encounter: Payer: Self-pay | Admitting: Gynecologic Oncology

## 2015-04-12 ENCOUNTER — Ambulatory Visit: Payer: BLUE CROSS/BLUE SHIELD | Attending: Gynecologic Oncology | Admitting: Gynecologic Oncology

## 2015-04-12 VITALS — BP 156/84 | HR 87 | Temp 98.2°F | Resp 20 | Ht 66.0 in | Wt 160.4 lb

## 2015-04-12 DIAGNOSIS — C569 Malignant neoplasm of unspecified ovary: Secondary | ICD-10-CM | POA: Insufficient documentation

## 2015-04-12 DIAGNOSIS — C562 Malignant neoplasm of left ovary: Secondary | ICD-10-CM

## 2015-04-12 DIAGNOSIS — Z8543 Personal history of malignant neoplasm of ovary: Secondary | ICD-10-CM | POA: Diagnosis not present

## 2015-04-12 DIAGNOSIS — Z95828 Presence of other vascular implants and grafts: Secondary | ICD-10-CM

## 2015-04-12 MED ORDER — SODIUM CHLORIDE 0.9 % IJ SOLN
10.0000 mL | INTRAMUSCULAR | Status: DC | PRN
  Administered 2015-04-12: 10 mL via INTRAVENOUS
  Filled 2015-04-12: qty 10

## 2015-04-12 MED ORDER — HEPARIN SOD (PORK) LOCK FLUSH 100 UNIT/ML IV SOLN
500.0000 [IU] | Freq: Once | INTRAVENOUS | Status: AC
Start: 2015-04-12 — End: 2015-04-12
  Administered 2015-04-12: 500 [IU] via INTRAVENOUS
  Filled 2015-04-12: qty 5

## 2015-04-12 NOTE — Patient Instructions (Signed)
Plan to follow up in Jan 2017 or sooner if needed.  Please call for any questions, concerns, or new symptoms.  Thank you for coming to see me today.  I appreciate your confidence in choosing [facility] for your medical care.  If you have any questions about your visit today, please call our office and we will get back to you as soon as possible.  Dr. Janie Morning and Joylene John, NP Gynecologic Oncology

## 2015-04-12 NOTE — Patient Instructions (Signed)

## 2015-04-12 NOTE — Progress Notes (Signed)
GYN ONCOLOGY OFFICE VISIT   Deborah Macdonald 58 y.o. female  Chief Complaint:  Ovarian cancer surveillance  Assessment/Plan:  Deborah Macdonald is a 59 y.o. with stage IV B. ovarian carcinoma. The initial presentation was that of extra-abdominal lymphadenopathy, metastatic disease within the supraclavicular lymph nodes, and CA 125 elevated to 1202.4. She received 6 cycles of dose dense neoadjuvant Taxol platinum therapy.  On 07/26/2013 she she underwent total abdominal hysterectomy bilateral salpingo-oophorectomy and omentectomy with excision of peritoneal metastases. No gross residual disease was visible  at the completion of surgery.  However, final pathology was notable for serous carcinoma within the peritoneal nodule and the left ovary and uterine serosa. 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   I recommended that Deborah Macdonald receive an additional 3 cycles of dose dense Taxol carboplatin therapy.  This was completed 10/26/2013  No evidence of disease.  Follow-up in 6 months with Gyn Onc Follow-up with Dr. Marko Plume in 3 months  Abnormal pap 20 years ago hrHPV collected and is  double negative.  Will stop pap testing   HPI:  Deborah Macdonald is a 59 y.o.  gravida 3 para 3, who was noted to have left cervical adenopathy in September 2013. She noted that it was a slowly increasing in size and the initial workup was for evaluation for lung disease. She underwent excision of supraclavicular LN on 01/21/2013 The malignant cells are positive for cytokeratin 7, estrogen receptor, progesterone receptor, and focally for p53. They are negative for cytokeratin 20, GCDFP, Napsin-A, , thyroglobulin, and TTF-1. This immunohistochemical profile raises the possibility of metastatic gynecologic carcinoma or metastatic breast carcinoma. The former is favored. 01/27/13 PET IMPRESSION: 1. Large complex cystic mass within the pelvis with intensely hypermetabolic nodular  components is most consistent with a epithelial ovarian adenocarcinoma. 2. Bulky peritoneal metastasis in the upper abdomen and along the capsule of the liver. 3. Hypermetabolic retroperitoneal nodal metastasis. 4. Small hypermetabolic mediastinal nodal metastasis. 5. Supraclavicular hypermetabolic nodal metastasis.    She completed six cycles of carbo/taxol, last on 06/16/13.  CT chest/abdomen/pelvis on 05/09/13 prior to completion of chemotherapy noted  1. Near complete resolution of the bulky the peritoneal implants. 2. Marked reduction in size of periaortic adenopathy with lymph nodes now less than 5 mm. 3. Marked reduction in the volume of the cystic and solid pelvic mass with now only a normal volume of right ovarian tissue measurable. 1. No evidence of disease progression in the thorax. 2. Interval decrease in size of mediastinal and supraclavicular lymphadenopathy.  CA 125 on 05/11/13 was 39.9.  On 07/26/2013 she underwent exploratory laparotomy total abdominal hysterectomy bilateral salpingo-oophorectomy omentectomy with and peritoneal excision.  Gross disease was notable only within the cul-de-sac and this was removed. Final pathology was consistent with serous carcinoma with associated fibrosis and psammoma bodies in the soft tissue biopsies of the peritoneum. Residual serous carcinoma was noted on the left ovary and the uterine serosa. Psammoma bodies were reported on the right ovary and bilateral fallopian tubes and also within the omentum.  Genetic testing (panel) negative  Postoperatively she received an additional 6 cycles taxol/Platin completed 10/2013. CT 11/23/2013 IMPRESSION:  1. Interval hysterectomy. No evidence of residual or metastatic  disease.  2. Cholelithiasis  Lab Results  Component Value Date   CA125 11.7 03/14/2014   Is doing well.  Has increased her physical activity. (H/o abn pap 20 years ago treated with cryotherapy) Pap 2016 - hrHPV neg  Review of Systems   Constitutional: Feels well.  No fever, chills, early satiety, continues to gain weight Cardiovascular: No chest pain, shortness of breath, or edema.  Pulmonary: No cough or wheeze.  Gastrointestinal: No nausea, vomiting, or diarrhea. No bright red blood per rectum or change in bowel movement.  Genitourinary: No frequency, urgency, or dysuria. No vaginal bleeding or discharge.  Musculoskeletal: No myalgia or joint pain. Neurologic: No weakness, numbness, or change in gait.  Psychology: No depression, has anxiety about recurrence.  Social Hx:  Family doing well.  Has increased her physical activity.  Doing well.  Past Surgical Hx:  Past Surgical History  Procedure Laterality Date  . Wisdom tooth extraction    . Lymph node biopsy Left 01/21/2013    Procedure: EXCISIONAL BIOPSY OF THE NECK;  Surgeon: Izora Gala, MD;  Location: [place];  Service: ENT;  Laterality: Left;  . Leep      20 year ago  . Vascular access device insertion Right     port a cath   . Abdominal hysterectomy Bilateral 07/26/2013    Procedure: EXPLORATORY LAPAROTOMY, TOTAL ABDOMINAL HYSTERECTOMY ,BILATERAL SALPINGO OOPHORECTOMY , OMENTECTOMY    ;  Surgeon: Janie Morning, MD;  Location: [facility];  Service: Gynecology;  Laterality: Bilateral;    Past Medical Hx:  Past Medical History  Diagnosis Date  . PONV (postoperative nausea and vomiting)   . MVP (mitral valve prolapse)   . Hypertension   . Anxiety   . Shortness of breath     at times "anxiety"  . Ovarian cancer on right   . Regional lymph node metastasis present   . Mental disorder   Colonoscopy 2013 Mammogram to be scheduled for 01/2015  Family Hx:  Family History  Problem Relation Age of Onset  . Heart attack Father   . Melanoma Maternal Uncle     dx in his 78s; mets to brain  . Dementia Maternal Grandmother   . COPD Maternal Grandfather   . Dementia Paternal Grandmother   . COPD Paternal Grandfather   . Testicular cancer Son 40    Vitals:  Blood  pressure 156/84, pulse 87, temperature 98.2 F (36.8 C), temperature source Oral, resp. rate 20, height 5' 6"  (1.676 m), weight 160 lb 6.4 oz (72.757 kg), SpO2 100 %. Wt Readings from Last 3 Encounters:  04/12/15 160 lb 6.4 oz (72.757 kg)  01/11/15 157 lb 9.6 oz (71.487 kg)  09/14/14 157 lb 8 oz (71.442 kg)    Physical Exam:  General: Well developed, well nourished female in no acute distress. Alert and oriented x 3.  Cardiovascular: Regular rate and rhythm. S1 and S2 normal.  Lungs: Clear to auscultation bilaterally. No wheezes/crackles/rhonchi noted.  Skin: No rashes or lesions present.    Pelvic: Normal external genitalia Bartholin's urethra Skene's gland.  No cul-de-sac masses Rectal:  Good tone no masses, no tenderness Back: No CVA tenderness.  Extremities: No bilateral cyanosis, edema, or clubbing.  LN:  No cervical supra clavicular or inguinal adenopathy

## 2015-05-24 ENCOUNTER — Other Ambulatory Visit: Payer: Self-pay | Admitting: Gynecologic Oncology

## 2015-05-24 DIAGNOSIS — R232 Flushing: Secondary | ICD-10-CM

## 2015-05-24 MED ORDER — CLONIDINE HCL 0.1 MG PO TABS: 0.1000 mg | ORAL_TABLET | Freq: Every day | ORAL | Status: DC

## 2015-05-24 NOTE — Addendum Note (Signed)
Addended by: Joylene John D on: 05/24/2015 02:27 PM   Modules accepted: Orders

## 2015-06-08 ENCOUNTER — Telehealth: Payer: Self-pay | Admitting: *Deleted

## 2015-06-08 ENCOUNTER — Telehealth: Payer: Self-pay | Admitting: Oncology

## 2015-06-08 NOTE — Telephone Encounter (Signed)
Spoke with patient and she states she is doing well and agreeable to postpone flush and MD visit with Dr. Marko Plume out to the end of the month as she was just seen by GYN ONC 04/12/15. No other concerns noted at this time - patient agreeable to new appt times.

## 2015-06-08 NOTE — Telephone Encounter (Signed)
Called and left a message with a new schedule per  [facility]

## 2015-06-14 ENCOUNTER — Ambulatory Visit: Payer: BLUE CROSS/BLUE SHIELD | Admitting: Oncology

## 2015-07-08 ENCOUNTER — Other Ambulatory Visit: Payer: Self-pay | Admitting: Oncology

## 2015-07-08 DIAGNOSIS — C561 Malignant neoplasm of right ovary: Secondary | ICD-10-CM

## 2015-07-09 ENCOUNTER — Other Ambulatory Visit: Payer: Self-pay | Admitting: *Deleted

## 2015-07-09 ENCOUNTER — Telehealth: Payer: Self-pay | Admitting: Oncology

## 2015-07-09 NOTE — Telephone Encounter (Signed)
called and left a message with lab and flush for 9/27

## 2015-07-10 ENCOUNTER — Ambulatory Visit (HOSPITAL_BASED_OUTPATIENT_CLINIC_OR_DEPARTMENT_OTHER): Payer: BLUE CROSS/BLUE SHIELD

## 2015-07-10 ENCOUNTER — Other Ambulatory Visit: Payer: BLUE CROSS/BLUE SHIELD

## 2015-07-10 DIAGNOSIS — C562 Malignant neoplasm of left ovary: Secondary | ICD-10-CM

## 2015-07-10 DIAGNOSIS — Z95828 Presence of other vascular implants and grafts: Secondary | ICD-10-CM

## 2015-07-10 DIAGNOSIS — C561 Malignant neoplasm of right ovary: Secondary | ICD-10-CM

## 2015-07-10 LAB — COMPREHENSIVE METABOLIC PANEL (CC13)
ALBUMIN: 4.1 g/dL (ref 3.5–5.0)
ALT: 17 U/L (ref 0–55)
AST: 16 U/L (ref 5–34)
Alkaline Phosphatase: 84 U/L (ref 40–150)
Anion Gap: 10 mEq/L (ref 3–11)
BUN: 18.9 mg/dL (ref 7.0–26.0)
CALCIUM: 9.3 mg/dL (ref 8.4–10.4)
CHLORIDE: 105 meq/L (ref 98–109)
CO2: 23 mEq/L (ref 22–29)
CREATININE: 1 mg/dL (ref 0.6–1.1)
EGFR: 65 mL/min/{1.73_m2} — ABNORMAL LOW (ref >90)
GLUCOSE: 97 mg/dL (ref 70–140)
POTASSIUM: 4.2 meq/L (ref 3.5–5.1)
SODIUM: 138 meq/L (ref 136–145)
Total Bilirubin: <0.3 mg/dL (ref 0.20–1.20)
Total Protein: 6.3 g/dL — ABNORMAL LOW (ref 6.4–8.3)

## 2015-07-10 LAB — CBC WITH DIFFERENTIAL/PLATELET
BASO%: 1 % (ref 0.0–2.0)
BASOS ABS: 0.1 10*3/uL (ref 0.0–0.1)
EOS%: 2.7 % (ref 0.0–7.0)
Eosinophils Absolute: 0.2 10*3/uL (ref 0.0–0.5)
HEMATOCRIT: 37.4 % (ref 34.8–46.6)
HEMOGLOBIN: 12.7 g/dL (ref 11.6–15.9)
LYMPH#: 2 10*3/uL (ref 0.9–3.3)
LYMPH%: 25 % (ref 14.0–49.7)
MCH: 30.9 pg (ref 25.1–34.0)
MCHC: 33.9 g/dL (ref 31.5–36.0)
MCV: 91.3 fL (ref 79.5–101.0)
MONO#: 0.7 10*3/uL (ref 0.1–0.9)
MONO%: 8.4 % (ref 0.0–14.0)
NEUT#: 5.1 10*3/uL (ref 1.5–6.5)
NEUT%: 62.9 % (ref 38.4–76.8)
Platelets: 272 10*3/uL (ref 145–400)
RBC: 4.09 10*6/uL (ref 3.70–5.45)
RDW: 12.7 % (ref 11.2–14.5)
WBC: 8 10*3/uL (ref 3.9–10.3)

## 2015-07-10 MED ORDER — SODIUM CHLORIDE 0.9 % IJ SOLN
10.0000 mL | INTRAMUSCULAR | Status: DC | PRN
  Administered 2015-07-10: 10 mL via INTRAVENOUS
  Filled 2015-07-10: qty 10

## 2015-07-10 MED ORDER — HEPARIN SOD (PORK) LOCK FLUSH 100 UNIT/ML IV SOLN
500.0000 [IU] | Freq: Once | INTRAVENOUS | Status: AC
  Administered 2015-07-10: 500 [IU] via INTRAVENOUS
  Filled 2015-07-10: qty 5

## 2015-07-10 NOTE — Patient Instructions (Signed)

## 2015-07-11 LAB — CA 125: CA 125: 19 U/mL (ref <35)

## 2015-07-12 ENCOUNTER — Telehealth: Payer: Self-pay | Admitting: Oncology

## 2015-07-12 ENCOUNTER — Encounter: Payer: Self-pay | Admitting: Oncology

## 2015-07-12 ENCOUNTER — Ambulatory Visit (HOSPITAL_BASED_OUTPATIENT_CLINIC_OR_DEPARTMENT_OTHER): Payer: BLUE CROSS/BLUE SHIELD | Admitting: Oncology

## 2015-07-12 VITALS — BP 160/75 | HR 95 | Temp 98.2°F | Resp 20 | Ht 66.0 in | Wt 160.5 lb

## 2015-07-12 DIAGNOSIS — C561 Malignant neoplasm of right ovary: Secondary | ICD-10-CM | POA: Diagnosis not present

## 2015-07-12 DIAGNOSIS — Z23 Encounter for immunization: Secondary | ICD-10-CM

## 2015-07-12 DIAGNOSIS — Z95828 Presence of other vascular implants and grafts: Secondary | ICD-10-CM | POA: Insufficient documentation

## 2015-07-12 DIAGNOSIS — R978 Other abnormal tumor markers: Secondary | ICD-10-CM

## 2015-07-12 MED ORDER — INFLUENZA VAC SPLIT QUAD 0.5 ML IM SUSY
0.5000 mL | PREFILLED_SYRINGE | Freq: Once | INTRAMUSCULAR | Status: AC
  Administered 2015-07-12: 0.5 mL via INTRAMUSCULAR
  Filled 2015-07-12: qty 0.5

## 2015-07-12 NOTE — Progress Notes (Signed)
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 CA 125 marker drawn 07-10-15 was slightly higher than previously, at 19.  Last imaging was CT AP 11-23-13. She saw Dr Skeet Latch on 04-12-15 and is to see her again ~ 3 months from this visit. Discussed with gyn onc now, and with slight change in marker and Dr Leone Brand schedule at [facility] office, have made this appointment for Nov same day as repeat CA 125 marker from [facility]. She had PE by PCP last week, cholesterol up otherwise nothing of concern per patient.  Patient has felt entirely well, with no concerns that seem referable to cancer history or that treatment. The PAC had not been flushed in 3 months when done earlier this week, and we have discussed need to keep this flushed every 6-8 weeks when not otherwise used.  Appetite and energy are excellent. Bowels are moving regularly without medication. Bladder fine. No abdominal or pelvic pain. No LE swelling. No bleeding. No respiratory symptoms. No other pain.     She had negative testing for breast/ovarian cancer genetics panel in 06-2013 at [facility]. She has PAC, flushed 07-10-15 Flu vaccine given 07-12-15 CA 125 at diagnosis 01-2013 was 1202  ONCOLOGIC HISTORY Patient saw Dr Juanda Crumble [place]/ NP [place] regularly for years and was up to date on gyn exams at the time of this diagnosis. She presented with several months of slowly enlarging left supraclavicular adenopathy, measuring ~ 2 cm at time of biopsy by Dr Adrian Saran 01-22-12. Pathology [phone]) showed metastatic adenocarcinoma with immunohistochemical profile consistent with gyn vs breast primary, including ER  PR +. CA 125 was 1202. Breast imaging 01-28-13 at [facility] (bilateral diagnostic mammograms and [place]) had no findings of concern. PET/CT 02-03-13 in [facility] system found bilateral hypermetabolic supraclavicular and mediastinal adenopathy; 8.7 x 8.7 hypermetabolic cystic and solid pelvic mass with adjacent similar mass in right iliac fossa; multiple hypermetabolic peritoneal implants including area 5.5 x 3.5 cm at liver margin; no pulmonary or bony lesions. She was seen by Dr Janie Morning, with recommendation for dose dense taxol carboplatin, this given x 6 cycles from 02-09-2013 thru 06-16-2013, tolerated generally well without gCSF or any dose reductions. CT CAP 05-09-13 after 4 cycles of chemotherapy found radiographic resolution of supraclavicular adenopathy, no findings of concern in chest, near complete resolution of bulky peritoneal implants and marked improvement in abdominal and pelvic mass. She had two additional cycles of chemotherapy thru 06-16-2013, then interval surgery by Dr Skeet Latch on 07-26-13. Surgery was TAH BSO omentectomy and resection of peritoneal nodules, with no gross residual disease in abdomen or pelvis at completion of procedure, then same dose dense chemo for 3 additional cycles post surgery, completed 10-26-2013. CT CAP 11-23-13 had no evidence of recurrent or metastatic disease.    Review of systems as above, also: No recent infectious illness tho coworkers have been sick. No problems with PAC.  Remainder of 10 point Review of Systems negative.  Objective:  Vital signs in last 24 hours:  BP 160/75 mmHg  Pulse 95  Temp(Src) 98.2 F (36.8 C) (Oral)  Resp 20  Ht 5' 6" (1.676 m)  Wt 160 lb 8 oz (72.802 kg)  BMI 25.92 kg/m2  SpO2 98% Weight stable. Alert, oriented and appropriate. Ambulatory without difficulty.  No alopecia  HEENT:PERRL, sclerae not icteric. Oral mucosa moist without lesions, posterior pharynx clear.  Neck supple. No JVD.  Lymphatics:no  cervical,supraclavicular, axillary or inguinal adenopathy Resp: clear to auscultation bilaterally and normal percussion bilaterally Cardio: regular rate and rhythm. No gallop. GI: soft, nontender, not distended, no mass or organomegaly. Normally active bowel sounds. Surgical incision not remarkable. Musculoskeletal/ Extremities: without pitting edema, cords, tenderness Neuro: no change peripheral neuropathy. Otherwise nonfocal. PSYCH appropriate mood and affect Skin without rash, ecchymosis, petechiae Breasts: bilaterally without dominant mass, skin or nipple findings. Axillae benign. Portacath-without erythema or tenderness. Flushed easily and good blood return on 07-10-15  Lab Results:  Results for orders placed or performed in visit on 07/10/15  CBC with Differential  Result Value Ref Range   WBC 8.0 3.9 - 10.3 10e3/uL   NEUT# 5.1 1.5 - 6.5 10e3/uL   HGB 12.7 11.6 - 15.9 g/dL   HCT 37.4 34.8 - 46.6 %   Platelets 272 145 - 400 10e3/uL   MCV 91.3 79.5 - 101.0 fL   MCH 30.9 25.1 - 34.0 pg   MCHC 33.9 31.5 - 36.0 g/dL   RBC 4.09 3.70 - 5.45 10e6/uL   RDW 12.7 11.2 - 14.5 %   lymph# 2.0 0.9 - 3.3 10e3/uL   MONO# 0.7 0.1 - 0.9 10e3/uL   Eosinophils Absolute 0.2 0.0 - 0.5 10e3/uL   Basophils Absolute 0.1 0.0 - 0.1 10e3/uL   NEUT% 62.9 38.4 - 76.8 %   LYMPH% 25.0 14.0 - 49.7 %   MONO% 8.4 0.0 - 14.0 %   EOS% 2.7 0.0 - 7.0 %   BASO% 1.0 0.0 - 2.0 %  Comprehensive metabolic panel (Cmet) - [facility]  Result Value Ref Range   Sodium 138 136 - 145 mEq/L   Potassium 4.2 3.5 - 5.1 mEq/L   Chloride 105 98 - 109 mEq/L   CO2 23 22 - 29 mEq/L   Glucose 97 70 - 140 mg/dl   BUN 18.9 7.0 - 26.0 mg/dL   Creatinine 1.0 0.6 - 1.1 mg/dL   Total Bilirubin <0.30 0.20 - 1.20 mg/dL   Alkaline Phosphatase 84 40 - 150 U/L   AST 16 5 - 34 U/L   ALT 17 0 - 55 U/L   Total Protein 6.3 (L) 6.4 - 8.3 g/dL   Albumin 4.1 3.5 - 5.0 g/dL   Calcium 9.3 8.4 - 10.4 mg/dL   Anion Gap 10 3 - 11 mEq/L   EGFR 65 (L)  >90 ml/min/1.73 m2  CA 125  Result Value Ref Range   CA 125 19 <35 U/mL    CA 125 had been 11.7 in 03-2015 and 9.4 in 01-2014.  Studies/Results:  Last bilateral tomo mammograms at [facility] 02-22-15 heterogeneously dense but no other mammographic findings of concern  Medications: I have reviewed the patient's current medications. Flu vaccine given today.  DISCUSSION: interval history reviewed as above. No findings of concern other than slight elevation in CA 125 marker. We have decided to repeat the marker in ~ 6 weeks, at which time she will also see Dr Skeet Latch; if marker continues to increase, Ellen need to repeat scans. Patient is understandably anxious, but is in agreement with this plan for close follow up.   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 No evidence of disease after taxol carboplatinum, interval debulking and  additional same chemotherapy for total 9 cycles thru 10-26-13. CA 125 this week still in normal range, but higher than previous values and will be repeated at short interval of 6 weeks, which coordinates with an available appointmentwith Dr Skeet Latch in [place] clinic. I will adjust my next visit if needed depending on evaluation then 2.negative genetics testing for breast/ovarian in 06-2013 3.PAC in: needs flush every 6-8 weeks, fortunately functioned without difficulty this week despite 3 months since flush. 4.long past tobacco, discontinued with cancer diagnosis 5.heterogeneously dense breast tissue on mammograms, such that 3D tomo is best imaging and patient is aware;  these due again in 02-2016. 6.activity tolerance improved, weight stable. Discussed healthy diet. 7.chemo related cytopenias resolved 8.cholelithiasis seen on CT, not symptomatic. 9.flu vaccine done. 10.elevated cholesterol followed by PCP.   All questions answered and patient knows to call prior to  scheduled return visit if needed. Cc Dr Skeet Latch and PCP. Time spent 25 min including >50% counseling and coordination of care.  LIVESAY,LENNIS P, MD   07/12/2015, 8:44 AM

## 2015-07-12 NOTE — Telephone Encounter (Signed)
Appointments made and avs printed for patient,pof printed and held for 11/2015

## 2015-08-21 ENCOUNTER — Other Ambulatory Visit: Payer: Self-pay | Admitting: Gynecologic Oncology

## 2015-08-21 DIAGNOSIS — R232 Flushing: Secondary | ICD-10-CM

## 2015-08-21 MED ORDER — CLONIDINE HCL 0.1 MG PO TABS: 0.1000 mg | ORAL_TABLET | Freq: Every day | ORAL | Status: DC

## 2015-08-22 ENCOUNTER — Other Ambulatory Visit: Payer: BLUE CROSS/BLUE SHIELD

## 2015-08-22 ENCOUNTER — Ambulatory Visit (HOSPITAL_BASED_OUTPATIENT_CLINIC_OR_DEPARTMENT_OTHER): Payer: BLUE CROSS/BLUE SHIELD

## 2015-08-22 DIAGNOSIS — Z95828 Presence of other vascular implants and grafts: Secondary | ICD-10-CM

## 2015-08-22 DIAGNOSIS — C562 Malignant neoplasm of left ovary: Secondary | ICD-10-CM

## 2015-08-22 DIAGNOSIS — Z452 Encounter for adjustment and management of vascular access device: Secondary | ICD-10-CM | POA: Diagnosis not present

## 2015-08-22 DIAGNOSIS — C561 Malignant neoplasm of right ovary: Secondary | ICD-10-CM

## 2015-08-22 LAB — CA 125: CA 125: 22 U/mL (ref <35)

## 2015-08-22 MED ORDER — HEPARIN SOD (PORK) LOCK FLUSH 100 UNIT/ML IV SOLN
500.0000 [IU] | Freq: Once | INTRAVENOUS | Status: AC
  Administered 2015-08-22: 500 [IU] via INTRAVENOUS
  Filled 2015-08-22: qty 5

## 2015-08-22 MED ORDER — SODIUM CHLORIDE 0.9 % IJ SOLN
10.0000 mL | INTRAMUSCULAR | Status: DC | PRN
  Administered 2015-08-22: 10 mL via INTRAVENOUS
  Filled 2015-08-22: qty 10

## 2015-08-22 NOTE — Patient Instructions (Signed)

## 2015-08-23 ENCOUNTER — Other Ambulatory Visit: Payer: BLUE CROSS/BLUE SHIELD

## 2015-08-23 ENCOUNTER — Ambulatory Visit: Payer: BLUE CROSS/BLUE SHIELD | Attending: Gynecologic Oncology | Admitting: Gynecologic Oncology

## 2015-08-23 ENCOUNTER — Encounter: Payer: Self-pay | Admitting: Gynecologic Oncology

## 2015-08-23 VITALS — BP 168/95 | HR 102 | Temp 97.5°F | Resp 20 | Ht 66.0 in | Wt 159.1 lb

## 2015-08-23 DIAGNOSIS — C569 Malignant neoplasm of unspecified ovary: Secondary | ICD-10-CM

## 2015-08-23 DIAGNOSIS — Z9289 Personal history of other medical treatment: Secondary | ICD-10-CM | POA: Insufficient documentation

## 2015-08-23 DIAGNOSIS — R971 Elevated cancer antigen 125 [CA 125]: Secondary | ICD-10-CM

## 2015-08-23 NOTE — Patient Instructions (Signed)
Plan for a PET scan.  We will call you with the results.  Please call the office for any questions or concerns.

## 2015-08-23 NOTE — Progress Notes (Signed)
GYN ONCOLOGY OFFICE VISIT   Deborah Macdonald 59 y.o. female  Chief Complaint:  Ovarian cancer surveillance, rising CA125  Assessment/Plan:  Deborah Macdonald is a 59 y.o. with stage IV B. ovarian carcinoma. The initial presentation was that of extra-abdominal lymphadenopathy, metastatic disease within the supraclavicular lymph nodes, and CA 125 elevated to 1202.4. She received 6 cycles of dose dense neoadjuvant Taxol platinum therapy.  On 07/26/2013 she she underwent total abdominal hysterectomy bilateral salpingo-oophorectomy and omentectomy with excision of peritoneal metastases. No gross residual disease was visible  at the completion of surgery.  However, final pathology was notable for serous carcinoma within the peritoneal nodule and the left ovary and uterine serosa. 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  She received an additional 3 cycles of dose dense Taxol carboplatin therapy completed 10/26/2013   CA125 slowly rising from a nadir of 9 with significant anxiety.  No symptoms.  A 25 minute discussion was held regarding the significance of a rising low level CA 125 in the absence of symptoms.   Plan PET. If PET negative no further CA 125 unless symptomatic.  Follow-up with Gyn Onc in 3 months  Abnormal pap 20 years ago hrHPV collected and is  double negative.  Will stop pap testing   HPI:  Deborah Macdonald is a 59 y.o.  gravida 3 para 3, who was noted to have left cervical adenopathy in September 2013. She noted that it was a slowly increasing in size and the initial workup was for evaluation for lung disease. She underwent excision of supraclavicular LN on 01/21/2013 The malignant cells are positive for cytokeratin 7, estrogen receptor, progesterone receptor, and focally for p53. They are negative for cytokeratin 20, GCDFP, Napsin-A, , thyroglobulin, and TTF-1. This immunohistochemical profile raises the possibility of metastatic gynecologic  carcinoma or metastatic breast carcinoma. The former is favored. 01/27/13 PET IMPRESSION: 1. 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 2. Bulky peritoneal metastasis in the upper abdomen and along the capsule of the liver. 3. Hypermetabolic retroperitoneal nodal metastasis. 4. Small hypermetabolic mediastinal nodal metastasis. 5. Supraclavicular hypermetabolic nodal metastasis.    She completed six cycles of carbo/taxol, last on 06/16/13.  CT chest/abdomen/pelvis on 05/09/13 prior to completion of chemotherapy noted  1. Near complete resolution of the bulky the peritoneal implants. 2. Marked reduction in size of periaortic adenopathy with lymph nodes now less than 5 mm. 3. Marked reduction in the volume of the cystic and solid pelvic mass with now only a normal volume of right ovarian tissue measurable. 1. No evidence of disease progression in the thorax. 2. Interval decrease in size of mediastinal and supraclavicular lymphadenopathy.  CA 125 on 05/11/13 was 39.9.  On 07/26/2013 she underwent exploratory laparotomy total abdominal hysterectomy bilateral salpingo-oophorectomy omentectomy with and peritoneal excision.  Gross disease was notable only within the cul-de-sac and this was removed. Final pathology was consistent with serous carcinoma with associated fibrosis and psammoma bodies in the soft tissue biopsies of the peritoneum. Residual serous carcinoma was noted on the left ovary and the uterine serosa. Psammoma bodies were reported on the right ovary and bilateral fallopian tubes and also within the omentum.  Genetic testing (panel) negative  Postoperatively she received an additional 6 cycles taxol/Platin completed 10/2013. CT 11/23/2013 IMPRESSION:  1. Interval hysterectomy. No evidence of residual or metastatic  disease.  2. Cholelithiasis  Lab Results  Component  Value Date   CA125 22  08/22/2015  CA 125 slowly rising from a nadir of 9.  Is very anxious about the rise, tearly, shaking, distressed.  . Currently without symptoms of recurrent disease.   Review of Systems  Constitutional: Feels well.  No fever, chills, early satiety, continues to gain weight Cardiovascular: No chest pain, shortness of breath, or edema.  Pulmonary: No cough or wheeze.  Gastrointestinal: No nausea, vomiting, or diarrhea. No bright red blood per rectum or change in bowel movement.  Genitourinary: No frequency, urgency, or dysuria. No vaginal bleeding or discharge.  Musculoskeletal: No myalgia or joint pain. Neurologic: No weakness, numbness, or change in gait.  Psychology: No depression, has anxiety about recurrence.  Social Hx:  Family doing well.  Has increased her physical activity.  Doing well.  Past Surgical Hx:  Past Surgical History  Procedure Laterality Date  . Wisdom tooth extraction    . Lymph node biopsy Left 01/21/2013    Procedure: EXCISIONAL BIOPSY OF THE NECK;  Surgeon: Izora Gala, MD;  Location: [place];  Service: ENT;  Laterality: Left;  . Leep      20 year ago  . Vascular access device insertion Right     port a cath   . Abdominal hysterectomy Bilateral 07/26/2013    Procedure: EXPLORATORY LAPAROTOMY, TOTAL ABDOMINAL HYSTERECTOMY ,BILATERAL SALPINGO OOPHORECTOMY , OMENTECTOMY    ;  Surgeon: Janie Morning, MD;  Location: [facility];  Service: Gynecology;  Laterality: Bilateral;    Past Medical Hx:  Past Medical History  Diagnosis Date  . PONV (postoperative nausea and vomiting)   . MVP (mitral valve prolapse)   . Hypertension   . Anxiety   . Shortness of breath     at times "anxiety"  . Ovarian cancer on right   . Regional lymph node metastasis present   . Mental disorder   Colonoscopy 2013 Mammogram to be scheduled for 01/2015 (H/o abn pap 20 years ago treated with cryotherapy) Pap 2016 - hrHPV neg   Family Hx:  Family History  Problem Relation Age of Onset  .  Heart attack Father   . Melanoma Maternal Uncle     dx in his 59s; mets to brain  . Dementia Maternal Grandmother   . COPD Maternal Grandfather   . Dementia Paternal Grandmother   . COPD Paternal Grandfather   . Testicular cancer Son 24    Vitals:  There were no vitals taken for this visit. Wt Readings from Last 3 Encounters:  07/12/15 160 lb 8 oz (72.802 kg)  04/12/15 160 lb 6.4 oz (72.757 kg)  01/11/15 157 lb 9.6 oz (71.487 kg)    Physical Exam:  General: Well developed, well nourished female in no acute distress. Alert and oriented x 3.  Cardiovascular: Regular rate and rhythm. S1 and S2 normal.  Lungs: Clear to auscultation bilaterally. No wheezes/crackles/rhonchi noted.  Skin: No rashes or lesions present.    Pelvic: Normal external genitalia Bartholin's urethra Skene's gland.  No cul-de-sac masses Rectal:  Good tone no masses, no tenderness Back: No CVA tenderness.  Extremities: No bilateral cyanosis, edema, or clubbing.  LN:  No cervical supra clavicular or inguinal adenopathy

## 2015-08-24 ENCOUNTER — Telehealth: Payer: Self-pay | Admitting: Gynecologic Oncology

## 2015-08-24 NOTE — Telephone Encounter (Signed)
Patient called stating she is unsure whether she would like to proceed with the scan because she is scared of what it Brault find.  Advised to think about it over the weekend and call on Monday.  All questions and concerns answered.  Advised to call for any needs.

## 2015-08-27 ENCOUNTER — Telehealth: Payer: Self-pay | Admitting: Gynecologic Oncology

## 2015-08-27 NOTE — Telephone Encounter (Signed)
Patient called stating she has decided to move her PET scan out until after Christmas.  She has discussed this with her husband and she would like to enjoy the holidays with her family then have the scan before the new year.    10:16am:  Informed patient of new date for the PET scan: Dec 29.  Advised to be NPO 6 hours before.  Verbalizing understanding.  No concerns voiced.  Advised to call for any needs.

## 2015-08-29 ENCOUNTER — Telehealth: Payer: Self-pay | Admitting: Oncology

## 2015-08-29 NOTE — Telephone Encounter (Signed)
Called and left a message with new 2017 appointments

## 2015-08-31 ENCOUNTER — Telehealth: Payer: Self-pay | Admitting: Gynecologic Oncology

## 2015-08-31 NOTE — Telephone Encounter (Signed)
Patient called and wanted to know what her friend with breast cancer would need to do to get an appt to see an oncologist.  She has been treated at [facility] and wanted to transfer care.  Advised [name] to have her friend call the main number, [phone], and have her ask for the new patient coordinator to see if she would need a referral.  No other concerns voiced.

## 2015-09-04 ENCOUNTER — Ambulatory Visit (HOSPITAL_COMMUNITY): Payer: BLUE CROSS/BLUE SHIELD

## 2015-09-11 ENCOUNTER — Telehealth: Payer: Self-pay | Admitting: Gynecologic Oncology

## 2015-09-11 NOTE — Telephone Encounter (Signed)
Returned call to patient.  Patient asking why she would need to have an appt with [facility] in Feb.  Advised to keep the appts as scheduled and we can re-evaluate closer to the date and after her PET scan.  Advised to call for any questions or concerns.

## 2015-10-03 ENCOUNTER — Telehealth: Payer: Self-pay | Admitting: Gynecologic Oncology

## 2015-10-03 NOTE — Telephone Encounter (Signed)
Returned call to patient.  Patient stating she would like to cancel her PET scan at this time and wait for the results of her CA 125.  If it is elevated, then she Hovatter consider rescheduling the PET.  Advised to call for any questions or concerns.

## 2015-10-11 ENCOUNTER — Ambulatory Visit (HOSPITAL_COMMUNITY): Payer: BLUE CROSS/BLUE SHIELD

## 2015-10-16 ENCOUNTER — Telehealth: Payer: Self-pay | Admitting: Gynecologic Oncology

## 2015-10-16 NOTE — Telephone Encounter (Signed)
Returned call to patient.  Patient very emotional stating she lost her husband to a heart attack on New Years Eve.  "I am not sure how much more I can take."  Wanting to cancel her appts until after March 1 when she will have insurance coverage.  Advised she would need a flush in between that time but patient stating she is not worried about that and only wants appts after March 1.  "I have to fight for my 59 year old daughter."  Advised to call for any questions or concerns.

## 2015-10-17 ENCOUNTER — Telehealth: Payer: Self-pay | Admitting: Oncology

## 2015-10-17 NOTE — Telephone Encounter (Signed)
See previous phone message,unable to reach patient,mailed a new calendar and letter to patient

## 2015-10-18 ENCOUNTER — Other Ambulatory Visit: Payer: BLUE CROSS/BLUE SHIELD

## 2015-12-06 ENCOUNTER — Ambulatory Visit: Payer: BLUE CROSS/BLUE SHIELD | Admitting: Oncology

## 2015-12-06 ENCOUNTER — Other Ambulatory Visit: Payer: BLUE CROSS/BLUE SHIELD

## 2015-12-09 ENCOUNTER — Other Ambulatory Visit: Payer: Self-pay | Admitting: Oncology

## 2015-12-13 ENCOUNTER — Ambulatory Visit (HOSPITAL_BASED_OUTPATIENT_CLINIC_OR_DEPARTMENT_OTHER): Payer: BLUE CROSS/BLUE SHIELD

## 2015-12-13 ENCOUNTER — Encounter: Payer: Self-pay | Admitting: Oncology

## 2015-12-13 ENCOUNTER — Ambulatory Visit (HOSPITAL_BASED_OUTPATIENT_CLINIC_OR_DEPARTMENT_OTHER): Payer: BLUE CROSS/BLUE SHIELD | Admitting: Oncology

## 2015-12-13 ENCOUNTER — Other Ambulatory Visit (HOSPITAL_BASED_OUTPATIENT_CLINIC_OR_DEPARTMENT_OTHER): Payer: BLUE CROSS/BLUE SHIELD

## 2015-12-13 VITALS — BP 174/85 | HR 94 | Temp 98.0°F | Resp 18 | Ht 66.0 in | Wt 158.5 lb

## 2015-12-13 DIAGNOSIS — C562 Malignant neoplasm of left ovary: Secondary | ICD-10-CM | POA: Diagnosis not present

## 2015-12-13 DIAGNOSIS — C561 Malignant neoplasm of right ovary: Secondary | ICD-10-CM

## 2015-12-13 DIAGNOSIS — Z95828 Presence of other vascular implants and grafts: Secondary | ICD-10-CM

## 2015-12-13 DIAGNOSIS — Z87891 Personal history of nicotine dependence: Secondary | ICD-10-CM

## 2015-12-13 LAB — CBC WITH DIFFERENTIAL/PLATELET
BASO%: 0.5 % (ref 0.0–2.0)
BASOS ABS: 0 10*3/uL (ref 0.0–0.1)
EOS ABS: 0.2 10*3/uL (ref 0.0–0.5)
EOS%: 2.6 % (ref 0.0–7.0)
HCT: 40.3 % (ref 34.8–46.6)
HEMOGLOBIN: 13.4 g/dL (ref 11.6–15.9)
LYMPH%: 28.2 % (ref 14.0–49.7)
MCH: 30.8 pg (ref 25.1–34.0)
MCHC: 33.3 g/dL (ref 31.5–36.0)
MCV: 92.6 fL (ref 79.5–101.0)
MONO#: 0.6 10*3/uL (ref 0.1–0.9)
MONO%: 7 % (ref 0.0–14.0)
NEUT%: 61.7 % (ref 38.4–76.8)
NEUTROS ABS: 5.1 10*3/uL (ref 1.5–6.5)
PLATELETS: 256 10*3/uL (ref 145–400)
RBC: 4.35 10*6/uL (ref 3.70–5.45)
RDW: 12.9 % (ref 11.2–14.5)
WBC: 8.3 10*3/uL (ref 3.9–10.3)
lymph#: 2.3 10*3/uL (ref 0.9–3.3)

## 2015-12-13 LAB — COMPREHENSIVE METABOLIC PANEL
ALT: 12 U/L (ref 0–55)
AST: 12 U/L (ref 5–34)
Albumin: 3.9 g/dL (ref 3.5–5.0)
Alkaline Phosphatase: 85 U/L (ref 40–150)
Anion Gap: 11 mEq/L (ref 3–11)
BUN: 19.5 mg/dL (ref 7.0–26.0)
CO2: 23 mEq/L (ref 22–29)
CREATININE: 0.8 mg/dL (ref 0.6–1.1)
Calcium: 9.1 mg/dL (ref 8.4–10.4)
Chloride: 105 mEq/L (ref 98–109)
EGFR: 86 mL/min/{1.73_m2} — ABNORMAL LOW (ref >90)
Glucose: 112 mg/dl (ref 70–140)
Potassium: 3.8 mEq/L (ref 3.5–5.1)
Sodium: 139 mEq/L (ref 136–145)
TOTAL PROTEIN: 6.5 g/dL (ref 6.4–8.3)

## 2015-12-13 MED ORDER — HEPARIN SOD (PORK) LOCK FLUSH 100 UNIT/ML IV SOLN
500.0000 [IU] | Freq: Once | INTRAVENOUS | Status: AC
  Administered 2015-12-13: 500 [IU] via INTRAVENOUS
  Filled 2015-12-13: qty 5

## 2015-12-13 MED ORDER — SODIUM CHLORIDE 0.9% FLUSH
10.0000 mL | INTRAVENOUS | Status: DC | PRN
  Administered 2015-12-13: 10 mL via INTRAVENOUS
  Filled 2015-12-13: qty 10

## 2015-12-13 NOTE — Progress Notes (Signed)
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 Last imaging was CT CAP 11-2013. She saw Dr Skeet Latch 08-23-15, with slight increase in CA 125; PET was recommended then, however patient perferred to delay that scan. In interim, her husband died of acute MI on October 27, 2015. Patient cancelled other appointments prior to today following his unexpected death. PAC had not been flushed since 08-23-15 until today, but fortunately functioned without difficulty.  Patient continues to grieve for her husband, cannot tell any other particular problems. She is able to eat and she is sleeping some. She denies abdominal or pelvic pain, bowels are moving regularly, no LE swelling, no bleeding, no SOB or cough. She has had no fever or symptoms of infection. No bleeding.  Remainder of 10 point Review of Systems negative   Negative testing for breast/ovarian cancer genetics panel in 06-2013 at [facility].  PAC, flushed 12-13-15 Flu vaccine given 07-12-15 CA 125 at diagnosis 01-2013 was 1202   ONCOLOGIC HISTORY Patient saw Dr Juanda Crumble Lomax/ NP [place] regularly for years and was up to date on gyn exams at the time of this diagnosis. She presented with several months of slowly enlarging left supraclavicular adenopathy, measuring ~ 2 cm at time of biopsy by Dr Adrian Saran 01-22-12. Pathology [phone]) showed metastatic adenocarcinoma with immunohistochemical profile consistent with gyn vs breast primary, including ER PR +. CA 125 was 1202. Breast imaging 01-28-13 at [facility] (bilateral diagnostic mammograms and [place]) had no findings of concern. PET/CT 02-03-13 in [facility] system found bilateral hypermetabolic  supraclavicular and mediastinal adenopathy; 8.7 x 8.7 hypermetabolic cystic and solid pelvic mass with adjacent similar mass in right iliac fossa; multiple hypermetabolic peritoneal implants including area 5.5 x 3.5 cm at liver margin; no pulmonary or bony lesions. She was seen by Dr Janie Morning, with recommendation for dose dense taxol carboplatin, this given x 6 cycles from 02-09-2013 thru 06-16-2013, tolerated generally well without gCSF or any dose reductions. CT CAP 05-09-13 after 4 cycles of chemotherapy found radiographic resolution of supraclavicular adenopathy, no findings of concern in chest, near complete resolution of bulky peritoneal implants and marked improvement in abdominal and pelvic mass. She had two additional cycles of chemotherapy thru 06-16-2013, then interval surgery by Dr Skeet Latch on 07-26-13. Surgery was TAH BSO omentectomy and resection of peritoneal nodules, with no gross residual disease in abdomen or pelvis at completion of procedure, then same dose dense chemo for 3 additional cycles post surgery, completed 10-26-2013. CT CAP 11-23-13 had no evidence of recurrent or metastatic disease.   Social: living alone. Daughter in [place]; son in [place]; youngest daughter age 81 in [place] since fall 2016.   Objective:  Vital signs in last 24 hours:  BP 174/85 mmHg  Pulse 94  Temp(Src) 98 F (36.7 C) (Oral)  Resp 18  Ht 5' 6"  (1.676 m)  Wt 158 lb 8 oz (71.895 kg)  BMI 25.59 kg/m2  SpO2 97% Weight down 2 lbs. Alert, oriented and appropriate, crying as she talks about loss of husband. Ambulatory without difficulty.   HEENT:PERRL, sclerae not icteric. Oral mucosa moist without lesions, posterior pharynx clear.  Neck supple. No JVD.  Lymphatics:no cervical or inguinal adenopathy. No clear supraclavicular adenopathy  Resp: clear to auscultation bilaterally and normal percussion bilaterally Cardio: regular rate and rhythm. No gallop. GI: soft, nontender, not distended,  no mass or organomegaly. Normally active bowel sounds. Surgical incision not remarkable. Musculoskeletal/ Extremities: without pitting edema, cords, tenderness Neuro: no peripheral neuropathy. Otherwise nonfocal. PSYCH grieving but still interacting well  Skin without rash, ecchymosis, petechiae Portacath-without erythema or tenderness  Lab Results:  Results for orders placed or performed in visit on 12/13/15  CBC with Differential  Result Value Ref Range   WBC 8.3 3.9 - 10.3 10e3/uL   NEUT# 5.1 1.5 - 6.5 10e3/uL   HGB 13.4 11.6 - 15.9 g/dL   HCT 40.3 34.8 - 46.6 %   Platelets 256 145 - 400 10e3/uL   MCV 92.6 79.5 - 101.0 fL   MCH 30.8 25.1 - 34.0 pg   MCHC 33.3 31.5 - 36.0 g/dL   RBC 4.35 3.70 - 5.45 10e6/uL   RDW 12.9 11.2 - 14.5 %   lymph# 2.3 0.9 - 3.3 10e3/uL   MONO# 0.6 0.1 - 0.9 10e3/uL   Eosinophils Absolute 0.2 0.0 - 0.5 10e3/uL   Basophils Absolute 0.0 0.0 - 0.1 10e3/uL   NEUT% 61.7 38.4 - 76.8 %   LYMPH% 28.2 14.0 - 49.7 %   MONO% 7.0 0.0 - 14.0 %   EOS% 2.6 0.0 - 7.0 %   BASO% 0.5 0.0 - 2.0 %  CA 125  Result Value Ref Range   Cancer Antigen (CA) 125 17.8 0.0 - 38.1 U/mL  Comprehensive metabolic panel  Result Value Ref Range   Sodium 139 136 - 145 mEq/L   Potassium 3.8 3.5 - 5.1 mEq/L   Chloride 105 98 - 109 mEq/L   CO2 23 22 - 29 mEq/L   Glucose 112 70 - 140 mg/dl   BUN 19.5 7.0 - 26.0 mg/dL   Creatinine 0.8 0.6 - 1.1 mg/dL   Total Bilirubin <0.30 0.20 - 1.20 mg/dL   Alkaline Phosphatase 85 40 - 150 U/L   AST 12 5 - 34 U/L   ALT 12 0 - 55 U/L   Total Protein 6.5 6.4 - 8.3 g/dL   Albumin 3.9 3.5 - 5.0 g/dL   Calcium 9.1 8.4 - 10.4 mg/dL   Anion Gap 11 3 - 11 mEq/L   EGFR 86 (L) >90 ml/min/1.73 m2  CA 125 (Parallel Testing)  Result Value Ref Range   CA 125 20 <35 U/mL   CA 125 by same lab method as "parallel testing" was 22 on 09-01-15 and 19 on 07-10-15 CA 125 by new lab method 17.8   Studies/Results:  No results found.  Medications: I have  reviewed the patient's current medications.  DISCUSSION We will let her know results of CA 125 marker as above, stable to slightly improved. Will follow, but does not appear to need further imaging just now.  We very much appreciate support from Dr Skeet Latch and Joylene John at time of her visit today.  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 No evidence of disease after taxol carboplatinum, interval debulking and additional same chemotherapy for total 9 cycles thru 10-26-13. CA 125 this week still in normal range, but higher than previous values and will be repeated at short interval of 6 weeks, which coordinates with an available appointmentwith Dr Skeet Latch in [place] clinic. I will adjust my next visit if needed depending on evaluation then 2.negative genetics testing for breast/ovarian in 06-2013  3.PAC in: needs flush every 6-8 weeks, fortunately functioned without difficulty this week despite 3 months since flush. 4.long past tobacco, discontinued with cancer diagnosis 5.heterogeneously dense breast tissue on mammograms, such that 3D tomo is best imaging and patient is aware; these due again in 02-2016. 6.activity tolerance improved, weight stable. Discussed healthy diet. 7.chemo related cytopenias resolved 8.cholelithiasis seen on CT, not symptomatic. 9.flu vaccine done. 10.elevated cholesterol followed by PCP.   All questions answered and she knows to call prior to next scheduled visit if needed. I will let gyn oncology know marker information.  Time spent 20 min including >50% counseling and coordination of care.     Gordy Levan, MD   12/15/2015, 9:41 PM

## 2015-12-13 NOTE — Patient Instructions (Signed)

## 2015-12-14 ENCOUNTER — Telehealth: Payer: Self-pay

## 2015-12-14 LAB — CANCER ANTIGEN 125 (PARALLEL TESTING): CA 125: 20 U/mL (ref <35)

## 2015-12-14 LAB — CA 125: CANCER ANTIGEN (CA) 125: 17.8 U/mL (ref 0.0–38.1)

## 2015-12-14 NOTE — Telephone Encounter (Signed)
-----   Message from Gordy Levan, MD sent at 12/13/2015  2:13 PM EST ----- We need to let her know results of CA 125 when available from 12-13-15. She Bierly need scans depending on results.  She will need next apts and will need PAC flushes.  Note her husband died of acute MI on 11/08/15.

## 2015-12-17 ENCOUNTER — Telehealth: Payer: Self-pay | Admitting: Gynecologic Oncology

## 2015-12-17 NOTE — Telephone Encounter (Signed)
Left message for patient about scheduling an appt to meet with Dr. Skeet Latch in three months.

## 2016-02-04 ENCOUNTER — Ambulatory Visit (HOSPITAL_BASED_OUTPATIENT_CLINIC_OR_DEPARTMENT_OTHER): Payer: BLUE CROSS/BLUE SHIELD

## 2016-02-04 VITALS — BP 156/88 | HR 90 | Temp 98.2°F | Resp 18

## 2016-02-04 DIAGNOSIS — C562 Malignant neoplasm of left ovary: Secondary | ICD-10-CM

## 2016-02-04 DIAGNOSIS — Z452 Encounter for adjustment and management of vascular access device: Secondary | ICD-10-CM | POA: Diagnosis not present

## 2016-02-04 DIAGNOSIS — Z95828 Presence of other vascular implants and grafts: Secondary | ICD-10-CM

## 2016-02-04 MED ORDER — SODIUM CHLORIDE 0.9% FLUSH
10.0000 mL | INTRAVENOUS | Status: DC | PRN
  Administered 2016-02-04: 10 mL via INTRAVENOUS
  Filled 2016-02-04: qty 10

## 2016-02-04 MED ORDER — HEPARIN SOD (PORK) LOCK FLUSH 100 UNIT/ML IV SOLN
500.0000 [IU] | Freq: Once | INTRAVENOUS | Status: AC
  Administered 2016-02-04: 500 [IU] via INTRAVENOUS
  Filled 2016-02-04: qty 5

## 2016-02-04 NOTE — Patient Instructions (Signed)

## 2016-04-09 ENCOUNTER — Other Ambulatory Visit: Payer: Self-pay | Admitting: *Deleted

## 2016-04-09 DIAGNOSIS — C561 Malignant neoplasm of right ovary: Secondary | ICD-10-CM

## 2016-04-09 NOTE — Progress Notes (Signed)
GYN ONCOLOGY OFFICE VISIT   Deborah Macdonald 59 y.o. female  Chief Complaint:  Ovarian cancer surveillance, rising CA125  Assessment/Plan:  Deborah Macdonald is a 60 y.o. with stage IV B. ovarian carcinoma. The initial presentation was that of extra-abdominal lymphadenopathy, metastatic disease within the supraclavicular lymph nodes, and CA 125 elevated to 1202.4. She received 6 cycles of dose dense neoadjuvant Taxol platinum therapy.  On 07/26/2013 she she underwent total abdominal hysterectomy bilateral salpingo-oophorectomy and omentectomy with excision of peritoneal metastases. No gross residual disease was visible  at the completion of surgery.  However, final pathology was notable for serous carcinoma within the peritoneal nodule and the left ovary and uterine serosa. 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  She received an additional 3 cycles of dose dense Taxol carboplatin therapy completed 10/26/2013   CA125 slowly rising from a nadir of 9 with significant anxiety.  No symptoms.  A 25 minute discussion was held regarding the significance of a rising low level CA 125 in the absence of symptoms.   Options presented PET versus CA125 surveillance versus evaluation when symptomatic.  Deborah Macdonald opted for evaluation if she develops symptoms such as bloating, ascites, decreased apetite, fatigue, changes in bowel or bladder habits.  Follow-up with Gyn Onc in 3 months  Abnormal pap 20 years ago hrHPV pap 2016 double negative No further pap surveillance indicated  Anxiety There are lot of social stressors most recent of which was the reluctant sale of her home and relocation to another place. Deborah Macdonald has been noncompliant with her blood pressure medication since the death of her husband and restarted the agents last week because she could not get a tooth extraction because of uncontrolled blood pressure. She admits to prior thoughts of self-harm  denies any current suicidal ideation or planning. I recommended that she follow up with a therapist however she has decided to reenter grief counseling  HPI:  Deborah Macdonald is a 60 y.o.  gravida 3 para 3, who was noted to have left cervical adenopathy in September 2013. She noted that it was a slowly increasing in size and the initial workup was for evaluation for lung disease. She underwent excision of supraclavicular LN on 01/21/2013 The malignant cells are positive for cytokeratin 7, estrogen receptor, progesterone receptor, and focally for p53. They are negative for cytokeratin 20, GCDFP, Napsin-A, , thyroglobulin, and TTF-1. This immunohistochemical profile raises the possibility of metastatic gynecologic carcinoma or metastatic breast carcinoma. The former is favored. 01/27/13 PET IMPRESSION: 1. 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 2. Bulky peritoneal metastasis in the upper abdomen and along the capsule of the liver. 3. Hypermetabolic retroperitoneal nodal metastasis. 4. Small hypermetabolic mediastinal nodal metastasis. 5. Supraclavicular hypermetabolic nodal metastasis.    She completed six cycles of carbo/taxol, last on 06/16/13.  CT chest/abdomen/pelvis on 05/09/13 prior to completion of chemotherapy noted  1. Near complete resolution of the bulky the peritoneal implants. 2. Marked reduction in size of periaortic adenopathy with lymph nodes now less than 5 mm. 3. Marked reduction in the volume of the cystic and solid pelvic mass with now only a normal volume of right ovarian tissue measurable. 1. No evidence of disease progression in the thorax. 2. Interval decrease in size of mediastinal and supraclavicular lymphadenopathy.  CA 125 on 05/11/13 was 39.9.  On 07/26/2013 she underwent exploratory laparotomy total abdominal hysterectomy  bilateral salpingo-oophorectomy omentectomy with and  peritoneal excision.  Gross disease was notable only within the cul-de-sac and this was removed. Final pathology was consistent with serous carcinoma with associated fibrosis and psammoma bodies in the soft tissue biopsies of the peritoneum. Residual serous carcinoma was noted on the left ovary and the uterine serosa. Psammoma bodies were reported on the right ovary and bilateral fallopian tubes and also within the omentum.  Genetic testing (panel) negative  Postoperatively she received an additional 6 cycles taxol/Platin completed 10/2013. CT 11/23/2013 IMPRESSION:  1. Interval hysterectomy. No evidence of residual or metastatic  disease.  2. Cholelithiasis  Lab Results  Component Value Date   CA125 20 12/13/2015  CA 125 slowly rising from a nadir of 9.  Is very anxious about the rise, tearly, shaking, distressed.  . Currently without symptoms of recurrent disease.   Review of Systems  Constitutional: Feels well.  No fever, chills, early satiety, continues to gain weight Cardiovascular: No chest pain, shortness of breath, or edema.  Pulmonary: No cough or wheeze.  Gastrointestinal: No nausea, vomiting, or diarrhea. No bright red blood per rectum or change in bowel movement.  Genitourinary: No frequency, urgency, or dysuria. No vaginal bleeding or discharge.  Musculoskeletal: No myalgia or joint pain. Neurologic: No weakness, numbness, or change in gait.  Psychology: No depression, has anxiety about recurrence.  Social Hx:  Family doing well.  Has increased her physical activity.  Doing well.  Past Surgical Hx:  Past Surgical History  Procedure Laterality Date  . Wisdom tooth extraction    . Lymph node biopsy Left 01/21/2013    Procedure: EXCISIONAL BIOPSY OF THE NECK;  Surgeon: Izora Gala, MD;  Location: [facility];  Service: ENT;  Laterality: Left;  . Leep      20 year ago  . Vascular access device insertion Right     port a cath   . Abdominal hysterectomy Bilateral 07/26/2013     Procedure: EXPLORATORY LAPAROTOMY, TOTAL ABDOMINAL HYSTERECTOMY ,BILATERAL SALPINGO OOPHORECTOMY , OMENTECTOMY    ;  Surgeon: Janie Morning, MD;  Location: [facility];  Service: Gynecology;  Laterality: Bilateral;    Past Medical Hx:  Past Medical History  Diagnosis Date  . PONV (postoperative nausea and vomiting)   . MVP (mitral valve prolapse)   . Hypertension   . Anxiety   . Shortness of breath     at times "anxiety"  . Ovarian cancer on right ([place])   . Regional lymph node metastasis present ([place])   . Mental disorder   Colonoscopy 2013 Mammogram to be scheduled for 01/2015 (H/o abn pap 20 years ago treated with cryotherapy) Pap 2016 - hrHPV neg   Family Hx:  Family History  Problem Relation Age of Onset  . Heart attack Father   . Melanoma Maternal Uncle     dx in his 67s; mets to brain  . Dementia Maternal Grandmother   . COPD Maternal Grandfather   . Dementia Paternal Grandmother   . COPD Paternal Grandfather   . Testicular cancer Son 37    Vitals:  Blood pressure 142/92, pulse 128, temperature 98.5 F (36.9 C), temperature source Oral, resp. rate 19, height 5' 6"  (1.676 m), weight 151 lb 6.4 oz (68.675 kg), SpO2 99 %. Wt Readings from Last 3 Encounters:  04/10/16 151 lb 6.4 oz (68.675 kg)  12/13/15 158 lb 8 oz (71.895 kg)  08/23/15 159 lb 1.6 oz (72.167 kg)    Physical Exam:  General: Well developed, well nourished  female in no acute distress. Alert and oriented x 3.  Cardiovascular: Regular rate and rhythm. S1 and S2 normal.  Lungs: Clear to auscultation bilaterally. No wheezes/crackles/rhonchi noted.  Skin: No rashes or lesions present.    Pelvic: Normal external genitalia Bartholin's urethra Skene's gland.  No cul-de-sac masses Rectal:  Good tone no masses, no tenderness Back: No CVA tenderness.  Extremities: No bilateral cyanosis, edema, or clubbing.  LN:  No cervical supra clavicular or inguinal adenopathy

## 2016-04-10 ENCOUNTER — Ambulatory Visit (HOSPITAL_BASED_OUTPATIENT_CLINIC_OR_DEPARTMENT_OTHER): Payer: BLUE CROSS/BLUE SHIELD

## 2016-04-10 ENCOUNTER — Ambulatory Visit: Payer: BLUE CROSS/BLUE SHIELD | Attending: Gynecologic Oncology | Admitting: Gynecologic Oncology

## 2016-04-10 ENCOUNTER — Encounter: Payer: Self-pay | Admitting: Gynecologic Oncology

## 2016-04-10 VITALS — BP 146/93 | HR 134 | Temp 98.1°F | Resp 18

## 2016-04-10 VITALS — BP 142/92 | HR 128 | Temp 98.5°F | Resp 19 | Ht 66.0 in | Wt 151.4 lb

## 2016-04-10 DIAGNOSIS — C569 Malignant neoplasm of unspecified ovary: Secondary | ICD-10-CM

## 2016-04-10 DIAGNOSIS — C772 Secondary and unspecified malignant neoplasm of intra-abdominal lymph nodes: Secondary | ICD-10-CM | POA: Insufficient documentation

## 2016-04-10 DIAGNOSIS — F419 Anxiety disorder, unspecified: Secondary | ICD-10-CM | POA: Diagnosis not present

## 2016-04-10 DIAGNOSIS — Z836 Family history of other diseases of the respiratory system: Secondary | ICD-10-CM | POA: Insufficient documentation

## 2016-04-10 DIAGNOSIS — C562 Malignant neoplasm of left ovary: Secondary | ICD-10-CM

## 2016-04-10 DIAGNOSIS — I10 Essential (primary) hypertension: Secondary | ICD-10-CM | POA: Diagnosis not present

## 2016-04-10 DIAGNOSIS — G4700 Insomnia, unspecified: Secondary | ICD-10-CM

## 2016-04-10 DIAGNOSIS — C786 Secondary malignant neoplasm of retroperitoneum and peritoneum: Secondary | ICD-10-CM | POA: Diagnosis not present

## 2016-04-10 DIAGNOSIS — Z90710 Acquired absence of both cervix and uterus: Secondary | ICD-10-CM | POA: Insufficient documentation

## 2016-04-10 DIAGNOSIS — R0602 Shortness of breath: Secondary | ICD-10-CM | POA: Insufficient documentation

## 2016-04-10 DIAGNOSIS — Z808 Family history of malignant neoplasm of other organs or systems: Secondary | ICD-10-CM | POA: Insufficient documentation

## 2016-04-10 DIAGNOSIS — I341 Nonrheumatic mitral (valve) prolapse: Secondary | ICD-10-CM | POA: Diagnosis not present

## 2016-04-10 DIAGNOSIS — Z9221 Personal history of antineoplastic chemotherapy: Secondary | ICD-10-CM | POA: Diagnosis not present

## 2016-04-10 DIAGNOSIS — Z95828 Presence of other vascular implants and grafts: Secondary | ICD-10-CM | POA: Insufficient documentation

## 2016-04-10 DIAGNOSIS — Z8249 Family history of ischemic heart disease and other diseases of the circulatory system: Secondary | ICD-10-CM | POA: Diagnosis not present

## 2016-04-10 DIAGNOSIS — Z9114 Patient's other noncompliance with medication regimen: Secondary | ICD-10-CM | POA: Insufficient documentation

## 2016-04-10 DIAGNOSIS — Z90722 Acquired absence of ovaries, bilateral: Secondary | ICD-10-CM | POA: Diagnosis not present

## 2016-04-10 DIAGNOSIS — Z8043 Family history of malignant neoplasm of testis: Secondary | ICD-10-CM | POA: Diagnosis not present

## 2016-04-10 DIAGNOSIS — Z452 Encounter for adjustment and management of vascular access device: Secondary | ICD-10-CM | POA: Diagnosis not present

## 2016-04-10 DIAGNOSIS — C561 Malignant neoplasm of right ovary: Secondary | ICD-10-CM

## 2016-04-10 MED ORDER — SODIUM CHLORIDE 0.9 % IJ SOLN
10.0000 mL | Freq: Once | INTRAMUSCULAR | Status: AC
  Administered 2016-04-10: 10 mL
  Filled 2016-04-10: qty 10

## 2016-04-10 MED ORDER — HEPARIN SOD (PORK) LOCK FLUSH 100 UNIT/ML IV SOLN
500.0000 [IU] | Freq: Once | INTRAVENOUS | Status: AC
  Administered 2016-04-10: 500 [IU]
  Filled 2016-04-10: qty 5

## 2016-04-10 NOTE — Patient Instructions (Signed)
We scheduled a 3 month follow up with Dr Janie Morning on September 28 , 2017 at 1:15 PM , call with any changes , questions or concerns. Please follow up with a grief counselor .  Thank you !

## 2016-05-26 ENCOUNTER — Telehealth: Payer: Self-pay | Admitting: Gynecologic Oncology

## 2016-05-26 NOTE — Telephone Encounter (Signed)
Returned call to patient.  Patient stating she would like to proceed with grief counseling because she feels she is becoming more angry about the death of her husband.  Recommended Dr. Agustina Caroli on [facility].  Advised patient to please call the office if another recommendation is needed.

## 2016-06-18 ENCOUNTER — Other Ambulatory Visit: Payer: Self-pay | Admitting: Gynecologic Oncology

## 2016-06-18 DIAGNOSIS — Z1231 Encounter for screening mammogram for malignant neoplasm of breast: Secondary | ICD-10-CM

## 2016-06-20 ENCOUNTER — Telehealth: Payer: Self-pay | Admitting: Gynecologic Oncology

## 2016-06-20 NOTE — Telephone Encounter (Signed)
Returned call to patient.  Attempted to leave a message with pt at both numbers but unable to.

## 2016-06-26 ENCOUNTER — Ambulatory Visit
Admission: RE | Admit: 2016-06-26 | Discharge: 2016-06-26 | Disposition: A | Discharge status: Home / Self Care | Payer: BLUE CROSS/BLUE SHIELD | Source: Ambulatory Visit | Attending: Gynecologic Oncology | Admitting: Gynecologic Oncology

## 2016-06-26 DIAGNOSIS — Z1231 Encounter for screening mammogram for malignant neoplasm of breast: Secondary | ICD-10-CM

## 2016-07-09 ENCOUNTER — Telehealth: Payer: Self-pay | Admitting: Gynecologic Oncology

## 2016-07-09 NOTE — Telephone Encounter (Signed)
Returned call to patient.  Patient stating she would like to get her port-a-cath out.  She is coming in tomorrow for follow up with Dr. Skeet Latch.  Advised we could discuss her request with Dr. Skeet Latch.  All questions answered.  Advised to call for any needs or concerns.

## 2016-07-10 ENCOUNTER — Encounter: Payer: Self-pay | Admitting: Gynecologic Oncology

## 2016-07-10 ENCOUNTER — Ambulatory Visit: Payer: BLUE CROSS/BLUE SHIELD | Attending: Gynecologic Oncology | Admitting: Gynecologic Oncology

## 2016-07-10 VITALS — BP 149/96 | HR 103 | Temp 98.6°F | Resp 18 | Wt 155.4 lb

## 2016-07-10 DIAGNOSIS — F411 Generalized anxiety disorder: Secondary | ICD-10-CM | POA: Diagnosis not present

## 2016-07-10 DIAGNOSIS — F419 Anxiety disorder, unspecified: Secondary | ICD-10-CM | POA: Insufficient documentation

## 2016-07-10 DIAGNOSIS — I10 Essential (primary) hypertension: Secondary | ICD-10-CM

## 2016-07-10 DIAGNOSIS — C561 Malignant neoplasm of right ovary: Secondary | ICD-10-CM

## 2016-07-10 DIAGNOSIS — C770 Secondary and unspecified malignant neoplasm of lymph nodes of head, face and neck: Secondary | ICD-10-CM

## 2016-07-10 DIAGNOSIS — Z9114 Patient's other noncompliance with medication regimen: Secondary | ICD-10-CM | POA: Diagnosis not present

## 2016-07-10 DIAGNOSIS — C569 Malignant neoplasm of unspecified ovary: Secondary | ICD-10-CM | POA: Insufficient documentation

## 2016-07-10 NOTE — Progress Notes (Signed)
GYN ONCOLOGY OFFICE VISIT   Deborah Macdonald 60 y.o. female  Chief Complaint:  Ovarian cancer surveillance,  Assessment/Plan:  Deborah Macdonald is a 60 y.o. with stage IV B. ovarian carcinoma. The initial presentation was that of extra-abdominal lymphadenopathy, metastatic disease within the supraclavicular lymph nodes, and CA 125 elevated to 1202.4. She received 6 cycles of dose dense neoadjuvant Taxol platinum therapy.  On 07/26/2013 she she underwent total abdominal hysterectomy bilateral salpingo-oophorectomy and omentectomy with excision of peritoneal metastases. No gross residual disease was visible  at the completion of surgery.  However, final pathology was notable for serous carcinoma within the peritoneal nodule and the left ovary and uterine serosa. 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  She received an additional 3 cycles of dose dense Taxol carboplatin therapy completed 10/26/2013   CA125 slowly rising from a nadir of 9 with significant anxiety.  No symptoms.  A 25 minute discussion was held regarding the significance of a rising low level CA 125 in the absence of symptoms.   Options presented PET versus CA125 surveillance versus evaluation when symptomatic.  Deborah Macdonald opted for evaluation if she develops symptoms such as bloating, ascites, decreased apetite, fatigue, changes in bowel or bladder habits.  Follow-up with Gyn Onc in 3 months We'll schedule repeat port removal Signs and symptoms of recurrent ovarian cancer discussed  Abnormal pap 20 years ago hrHPV pap 2016 double negative No further pap surveillance indicated  Anxiety There are lot of social stressors most recent of which was the reluctant sale of her home and relocation to another place. Deborah Macdonald has been noncompliant with her blood pressure medication since the death of her husband and restarted the agents last week because she could not get a tooth extraction  because of uncontrolled blood pressure. She admits to prior thoughts of self-harm denies any current suicidal ideation or planning. I recommended that she follow up with a therapist however she has decided to reenter grief counseling  HPI:  Deborah Macdonald is a 60 y.o.  gravida 3 para 3, who was noted to have left cervical adenopathy in September 2013. She noted that it was a slowly increasing in size and the initial workup was for evaluation for lung disease. She underwent excision of supraclavicular LN on 01/21/2013 The malignant cells are positive for cytokeratin 7, estrogen receptor, progesterone receptor, and focally for p53. They are negative for cytokeratin 20, GCDFP, Napsin-A, , thyroglobulin, and TTF-1. This immunohistochemical profile raises the possibility of metastatic gynecologic carcinoma or metastatic breast carcinoma. The former is favored. 01/27/13 PET IMPRESSION: 1. 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 2. Bulky peritoneal metastasis in the upper abdomen and along the capsule of the liver. 3. Hypermetabolic retroperitoneal nodal metastasis. 4. Small hypermetabolic mediastinal nodal metastasis. 5. Supraclavicular hypermetabolic nodal metastasis.    She completed six cycles of carbo/taxol, last on 06/16/13.  CT chest/abdomen/pelvis on 05/09/13 prior to completion of chemotherapy noted  1. Near complete resolution of the bulky the peritoneal implants. 2. Marked reduction in size of periaortic adenopathy with lymph nodes now less than 5 mm. 3. Marked reduction in the volume of the cystic and solid pelvic mass with now only a normal volume of right ovarian tissue measurable. 1. No evidence of disease progression in the thorax. 2. Interval decrease in size of mediastinal and supraclavicular lymphadenopathy.  CA 125 on 05/11/13 was  39.9.  On 07/26/2013 she underwent exploratory laparotomy  total abdominal hysterectomy bilateral salpingo-oophorectomy omentectomy with and peritoneal excision.  Gross disease was notable only within the cul-de-sac and this was removed. Final pathology was consistent with serous carcinoma with associated fibrosis and psammoma bodies in the soft tissue biopsies of the peritoneum. Residual serous carcinoma was noted on the left ovary and the uterine serosa. Psammoma bodies were reported on the right ovary and bilateral fallopian tubes and also within the omentum.  Genetic testing (panel) negative  Postoperatively she received an additional 6 cycles taxol/Platin completed 10/2013. CT 11/23/2013 IMPRESSION:  1. Interval hysterectomy. No evidence of residual or metastatic  disease.  2. Cholelithiasis  Lab Results  Component Value Date   CA125 20 12/13/2015  CA 125  nadir of 9.   Currently without symptoms of recurrent disease. Declines intervention unless symptomatic. Denies nausea vomiting diarrhea no abdominal bloating no change in abdominal girth, no changes in bowel or bladder habits good appetite. Strongly desires port removal   Review of Systems  Constitutional: Feels well.  No fever, chills, early satiety, continues to gain weight Cardiovascular: No chest pain, shortness of breath, or edema.  Pulmonary: No cough or wheeze.  Gastrointestinal: No nausea, vomiting, or diarrhea. No bright red blood per rectum or change in bowel movement.  Genitourinary: No frequency, urgency, or dysuria. No vaginal bleeding or discharge.  Musculoskeletal: No myalgia or joint pain. Neurologic: No weakness, numbness, or change in gait.  Psychology: No depression, feels well.  Has a lot of social support.   Past Surgical Hx:  Past Surgical History:  Procedure Laterality Date  . ABDOMINAL HYSTERECTOMY Bilateral 07/26/2013   Procedure: EXPLORATORY LAPAROTOMY, TOTAL ABDOMINAL HYSTERECTOMY ,BILATERAL SALPINGO OOPHORECTOMY , OMENTECTOMY    ;  Surgeon: Janie Morning, MD;   Location: [facility];  Service: Gynecology;  Laterality: Bilateral;  . LEEP     20 year ago  . LYMPH NODE BIOPSY Left 01/21/2013   Procedure: EXCISIONAL BIOPSY OF THE NECK;  Surgeon: Izora Gala, MD;  Location: [place];  Service: ENT;  Laterality: Left;  Marland Kitchen VASCULAR ACCESS DEVICE INSERTION Right    port a cath   . WISDOM TOOTH EXTRACTION      Past Medical Hx:  Past Medical History:  Diagnosis Date  . Anxiety   . Hypertension   . Mental disorder   . MVP (mitral valve prolapse)   . Ovarian cancer on right ([place])   . PONV (postoperative nausea and vomiting)   . Regional lymph node metastasis present ([place])   . Shortness of breath    at times "anxiety"  Colonoscopy 2013 Mammogram to be scheduled for 01/2015 (H/o abn pap 20 years ago treated with cryotherapy) Pap 2016 - hrHPV neg   Family Hx:  Family History  Problem Relation Age of Onset  . Heart attack Father   . Melanoma Maternal Uncle     dx in his 47s; mets to brain  . Dementia Maternal Grandmother   . COPD Maternal Grandfather   . Dementia Paternal Grandmother   . COPD Paternal Grandfather   . 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 Has supported work and in her personal life.  Vitals:  Blood pressure (!) 149/96, pulse (!) 103, temperature 98.6 F (37  C), temperature source Oral, resp. rate 18, weight 155 lb 6.4 oz (70.5 kg), SpO2 98 %. Wt Readings from Last 3 Encounters:  07/10/16 155 lb 6.4 oz (70.5 kg)  04/10/16 151 lb 6.4 oz (68.7 kg)  12/13/15 158 lb 8 oz (71.9 kg)    Physical Exam:  General: Well developed, well nourished female in no acute distress. Alert and oriented x 3.  Cardiovascular: Regular rate and rhythm. S1 and S2 normal.  Lungs: Clear to auscultation bilaterally. No  wheezes/crackles/rhonchi noted.  Skin: No rashes or lesions present.    Pelvic: Normal external genitalia Bartholin's urethra Skene's gland.  No cul-de-sac masses Rectal:  Good tone no masses, no tenderness Back: No CVA tenderness.  Extremities: No bilateral cyanosis, edema, or clubbing.  LN:  No cervical supra clavicular or inguinal adenopathy

## 2016-07-10 NOTE — Patient Instructions (Signed)
Plan to follow up in Jan 2018 or sooner if needed.  Please call in Nov or Dec 2017 to schedule.  You will receive a phone call from the hospital about port removal.

## 2016-07-10 NOTE — Addendum Note (Signed)
Addended by: Joylene John D on: 07/10/2016 03:24 PM   Modules accepted: Orders

## 2016-08-01 ENCOUNTER — Ambulatory Visit (HOSPITAL_COMMUNITY): Payer: BLUE CROSS/BLUE SHIELD

## 2016-08-01 ENCOUNTER — Other Ambulatory Visit (HOSPITAL_COMMUNITY): Payer: BLUE CROSS/BLUE SHIELD

## 2016-08-14 ENCOUNTER — Other Ambulatory Visit: Payer: Self-pay | Admitting: General Surgery

## 2016-08-15 ENCOUNTER — Encounter (HOSPITAL_COMMUNITY): Payer: Self-pay

## 2016-08-15 ENCOUNTER — Ambulatory Visit (HOSPITAL_COMMUNITY)
Admission: RE | Admit: 2016-08-15 | Discharge: 2016-08-15 | Disposition: A | Discharge status: Home / Self Care | Payer: BLUE CROSS/BLUE SHIELD | Source: Ambulatory Visit | Attending: Gynecologic Oncology | Admitting: Gynecologic Oncology

## 2016-08-15 DIAGNOSIS — Z7982 Long term (current) use of aspirin: Secondary | ICD-10-CM | POA: Insufficient documentation

## 2016-08-15 DIAGNOSIS — Z01818 Encounter for other preprocedural examination: Secondary | ICD-10-CM | POA: Diagnosis not present

## 2016-08-15 DIAGNOSIS — Z79899 Other long term (current) drug therapy: Secondary | ICD-10-CM | POA: Diagnosis not present

## 2016-08-15 DIAGNOSIS — F17210 Nicotine dependence, cigarettes, uncomplicated: Secondary | ICD-10-CM | POA: Insufficient documentation

## 2016-08-15 DIAGNOSIS — C561 Malignant neoplasm of right ovary: Secondary | ICD-10-CM | POA: Insufficient documentation

## 2016-08-15 HISTORY — PX: IR GENERIC HISTORICAL: IMG1180011

## 2016-08-15 LAB — CBC
HEMATOCRIT: 41.5 % (ref 36.0–46.0)
Hemoglobin: 14.5 g/dL (ref 12.0–15.0)
MCH: 31.2 pg (ref 26.0–34.0)
MCHC: 34.9 g/dL (ref 30.0–36.0)
MCV: 89.2 fL (ref 78.0–100.0)
Platelets: 258 10*3/uL (ref 150–400)
RBC: 4.65 MIL/uL (ref 3.87–5.11)
RDW: 12.2 % (ref 11.5–15.5)
WBC: 9.6 10*3/uL (ref 4.0–10.5)

## 2016-08-15 LAB — PROTIME-INR
INR: 0.91
Prothrombin Time: 12.2 seconds (ref 11.4–15.2)

## 2016-08-15 MED ORDER — CEFAZOLIN SODIUM-DEXTROSE 2-4 GM/100ML-% IV SOLN
2.0000 g | INTRAVENOUS | Status: AC
  Administered 2016-08-15: 2 g via INTRAVENOUS
  Filled 2016-08-15: qty 100

## 2016-08-15 MED ORDER — SODIUM CHLORIDE 0.9 % IV SOLN
INTRAVENOUS | Status: DC
  Administered 2016-08-15: 13:00:00 via INTRAVENOUS

## 2016-08-15 MED ORDER — LIDOCAINE HCL 1 % IJ SOLN
INTRAMUSCULAR | Status: AC
  Administered 2016-08-15: 15 mL
  Filled 2016-08-15: qty 20

## 2016-08-15 NOTE — H&P (Signed)
Chief Complaint: Requesting removal of Port A Cath  Referring Physician(s): Ponder D  Supervising Physician: Corrie Mckusick  Patient Status: [facility] - Out-pt  History of Present Illness: Deborah Macdonald is a 60 y.o. female with history of ovarian cancer.  She had a Port A Cath placed on 03/04/2013 by Dr. Pascal Lux  She states she has completed therapy and is requesting removal.  She reports it worked well with no issues and no signs of infection  She is NOT NPO today as she does not wish to have sedation, only local anesthesia/lidocaine.  She is not taking blood thinners. She denies fever/chills  Past Medical History:  Diagnosis Date  . Anxiety   . Hypertension   . Mental disorder   . MVP (mitral valve prolapse)   . Ovarian cancer on right ([place])   . PONV (postoperative nausea and vomiting)   . Regional lymph node metastasis present ([place])   . Shortness of breath    at times "anxiety"    Past Surgical History:  Procedure Laterality Date  . ABDOMINAL HYSTERECTOMY Bilateral 07/26/2013   Procedure: EXPLORATORY LAPAROTOMY, TOTAL ABDOMINAL HYSTERECTOMY ,BILATERAL SALPINGO OOPHORECTOMY , OMENTECTOMY    ;  Surgeon: Janie Morning, MD;  Location: [facility];  Service: Gynecology;  Laterality: Bilateral;  . LEEP     20 year ago  . LYMPH NODE BIOPSY Left 01/21/2013   Procedure: EXCISIONAL BIOPSY OF THE NECK;  Surgeon: Izora Gala, MD;  Location: [place];  Service: ENT;  Laterality: Left;  Marland Kitchen VASCULAR ACCESS DEVICE INSERTION Right    port a cath   . WISDOM TOOTH EXTRACTION      Allergies: Codeine  Medications: Prior to Admission medications   Medication Sig Start Date End Date Taking? Authorizing Provider  ALPRAZolam Duanne Moron) 0.5 MG tablet Take 0.5 mg by mouth 3 (three) times daily as needed for sleep or anxiety.   Yes Historical Provider, MD  Aspirin-Salicylamide-Caffeine (BC HEADACHE POWDER PO) Take 1 packet by mouth every 6 (six) hours as needed (headache).   Yes Historical  Provider, MD  cloNIDine (CATAPRES) 0.1 MG tablet Take 1 tablet (0.1 mg total) by mouth at bedtime. 08/21/15  Yes Dorothyann Gibbs, NP  FLUoxetine (PROZAC) 10 MG capsule  08/04/15  Yes Historical Provider, MD  simvastatin (ZOCOR) 20 MG tablet Take 20 mg by mouth daily.   Yes Historical Provider, MD  triamterene-hydrochlorothiazide (MAXZIDE-25) 37.5-25 MG per tablet Take 1 tablet by mouth daily. Reported on 12/13/2015 05/16/14  Yes Historical Provider, MD  zolpidem (AMBIEN) 5 MG tablet Take 1 tablet (5 mg total) by mouth at bedtime as needed for sleep. 01/11/15  Yes Janie Morning, MD  lidocaine-prilocaine (EMLA) cream Apply topically as needed. Apply to skin over Porta Cath site at least one hour prior to needle stick Patient not taking: Reported on 07/10/2016 03/02/13   Nobie Putnam, MD     Family History  Problem Relation Age of Onset  . Heart attack Father   . Melanoma Maternal Uncle     dx in his 28s; mets to brain  . Dementia Maternal Grandmother   . COPD Maternal Grandfather   . Dementia Paternal Grandmother   . COPD Paternal Grandfather   . Testicular cancer Son 70    Social History   Social History  . Marital status: Married    Spouse name: N/A  . Number of children: 3  . Years of education: N/A   Occupational History  . [employer]CTIONS  [employer]  Social History Main Topics  . Smoking status: Current Some Day Smoker    Packs/day: 0.50    Years: 39.00    Last attempt to quit: 01/13/2013  . Smokeless tobacco: Never Used  . Alcohol use 1.0 oz/week    2 Standard drinks or equivalent per week  . Drug use: No  . Sexual activity: Not Asked   Other Topics Concern  . None   Social History Narrative  . None    Review of Systems: A 12 point ROS discussed  Review of Systems  Constitutional: Negative.   HENT: Negative.   Respiratory: Negative.   Cardiovascular: Negative.   Genitourinary: Negative.   Musculoskeletal: Negative.   Skin: Negative.   Neurological:  Negative.   Hematological: Negative.   Psychiatric/Behavioral: Negative.     Vital Signs: BP (!) 142/83   Pulse 96   Temp 99.1 F (37.3 C) (Oral)   Resp 16   SpO2 97%   Physical Exam  Constitutional: She is oriented to person, place, and time. She appears well-developed and well-nourished.  HENT:  Head: Atraumatic.  Eyes: EOM are normal.  Neck: Normal range of motion.  Cardiovascular: Normal rate, regular rhythm and normal heart sounds.   Pulmonary/Chest: Effort normal and breath sounds normal. No respiratory distress. She has no wheezes.  Musculoskeletal: Normal range of motion.  Neurological: She is alert and oriented to person, place, and time.  Psychiatric: She has a normal mood and affect. Her behavior is normal. Judgment and thought content normal.  Vitals reviewed.   Imaging: No results found.  Labs:  CBC:  Recent Labs  12/13/15 1336 08/15/16 1241  WBC 8.3 9.6  HGB 13.4 14.5  HCT 40.3 41.5  PLT 256 258    COAGS:  Recent Labs  08/15/16 1241  INR 0.91    BMP:  Recent Labs  12/13/15 1336  NA 139  K 3.8  CO2 23  GLUCOSE 112  BUN 19.5  CALCIUM 9.1  CREATININE 0.8    LIVER FUNCTION TESTS:  Recent Labs  12/13/15 1336  BILITOT <0.30  AST 12  ALT 12  ALKPHOS 85  PROT 6.5  ALBUMIN 3.9    TUMOR MARKERS: No results for input(s): AFPTM, CEA, CA199, CHROMGRNA in the last 8760 hours.  Assessment and Plan:  History of Ovarian cancer Completion of therapy and requesting removal of Port A Cath  Will proceed today by Dr. Earleen Newport.  Risks and Benefits discussed with the patient including, but not limited to bleeding, infection, and need for additional procedures.  All of the patient's questions were answered, patient is agreeable to proceed. Consent signed and in chart.  Thank you for this interesting consult.  I greatly enjoyed meeting Naseem Adler Cassidy and look forward to participating in their care.  A copy of this report was sent to the  requesting provider on this date.  Electronically Signed: Murrell Redden PA-C 08/15/2016, 1:46 PM   I spent a total of  15 Minutes  in face to face in clinical consultation, greater than 50% of which was counseling/coordinating care for [facility] Removal

## 2016-08-15 NOTE — Discharge Instructions (Signed)
Incision Care  An incision (cut) is when a surgeon cuts into your body. After surgery, the cut needs to be well cared for to keep it from getting infected.  HOW TO CARE FOR YOUR CUT  Take medicines only as told by your doctor.  There are many different ways to close and cover a cut, including stitches, skin glue, and adhesive strips. Follow your doctor's instructions on:  Care of the cut.  Bandage (dressing) changes and removal.  Cut closure removal.  Do not take baths, swim, or use a hot tub until your doctor says it is okay. You Bartel shower as told by your doctor.  Return to your normal diet and activities as allowed by your doctor.  Use medicine that helps lessen itching on your cut as told by your doctor. Do not pick or scratch at your cut.  Drink enough fluids to keep your pee (urine) clear or pale yellow. GET HELP IF:  You have redness, puffiness (swelling), or pain at the site of your cut.  You have fluid, blood, or pus coming from your cut.  Your muscles ache.  You have chills or you feel sick.  You have a bad smell coming from the cut or bandage.  Your cut opens up after stitches, staples, or adhesive strips have been removed.  You keep feeling sick to your stomach (nauseous) or keep throwing up (vomiting).  You have a fever.  You are dizzy. GET HELP RIGHT AWAY IF:  You have a rash.  You pass out (faint).  You have trouble breathing. MAKE SURE YOU:   Understand these instructions.  Will watch your condition.  Will get help right away if you are not doing well or get worse.   This information is not intended to replace advice given to you by your health care provider. Make sure you discuss any questions you have with your health care provider.   Document Released: 12/22/2011 Document Revised: 10/20/2014 Document Reviewed: 11/23/2013 Elsevier Interactive Patient Education [employer].

## 2016-08-15 NOTE — Procedures (Signed)
Interventional Radiology Procedure Note  Procedure: Removal of right IJ approach single lumen PowerPort.  Removed in its entirety.  Complications: None Recommendations:  - Ok to shower tomorrow - Do not submerge for 7 days - Routine wound care   Signed,  Dulcy Fanny. Earleen Newport, DO

## 2016-08-27 ENCOUNTER — Other Ambulatory Visit: Payer: Self-pay | Admitting: Gynecologic Oncology

## 2016-08-27 DIAGNOSIS — R232 Flushing: Secondary | ICD-10-CM

## 2016-08-27 MED ORDER — CLONIDINE HCL 0.1 MG PO TABS: 0.1000 mg | ORAL_TABLET | Freq: Every day | ORAL | 5 refills | Status: AC

## 2016-10-14 ENCOUNTER — Other Ambulatory Visit: Payer: Self-pay | Admitting: Nurse Practitioner

## 2016-11-09 NOTE — Progress Notes (Signed)
GYN ONCOLOGY OFFICE VISIT   Deborah Macdonald 61 y.o. female  Chief Complaint:  Ovarian cancer surveillance,  Assessment/Plan:  Deborah Macdonald is a 61 y.o. with stage IV B. ovarian carcinoma. The initial presentation was that of extra-abdominal lymphadenopathy, metastatic disease within the supraclavicular lymph nodes, and CA 125 elevated to 1202.4. She received 6 cycles of dose dense neoadjuvant Taxol platinum therapy.  On 07/26/2013 she she underwent total abdominal hysterectomy bilateral salpingo-oophorectomy and omentectomy with excision of peritoneal metastases. No gross residual disease was visible  at the completion of surgery.  However, final pathology was notable for serous carcinoma within the peritoneal nodule and the left ovary and uterine serosa. 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  She received an additional 3 cycles of dose dense Taxol carboplatin therapy completed 10/26/2013   CA125 slowly rising from a nadir of 9 with significant anxiety.  No symptoms.  A 25 minute discussion was held regarding the significance of a rising low level CA 125 in the absence of symptoms.   Options presented PET versus CA125 surveillance versus evaluation when symptomatic.  Deborah Macdonald opted for evaluation if she develops symptoms such as bloating, ascites, decreased apetite, fatigue, changes in bowel or bladder habits.  Follow-up with Gyn Onc in 1 year Follow-up with Dr. Nena Polio in 6 months  Abnormal pap 20 years ago hrHPV pap 2016 double negative No further pap surveillance indicated   HPI:  Deborah Macdonald is a 61 y.o.  gravida 3 para 3, who was noted to have left cervical adenopathy in September 2013. She noted that it was a slowly increasing in size and the initial workup was for evaluation for lung disease. She underwent excision of supraclavicular LN on 01/21/2013 The malignant cells are positive for cytokeratin 7, estrogen receptor,  progesterone receptor, and focally for p53. They are negative for cytokeratin 20, GCDFP, Napsin-A, , thyroglobulin, and TTF-1. This immunohistochemical profile raises the possibility of metastatic gynecologic carcinoma or metastatic breast carcinoma. The former is favored. 01/27/13 PET IMPRESSION: 1. 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 2. Bulky peritoneal metastasis in the upper abdomen and along the capsule of the liver. 3. Hypermetabolic retroperitoneal nodal metastasis. 4. Small hypermetabolic mediastinal nodal metastasis. 5. Supraclavicular hypermetabolic nodal metastasis.    She completed six cycles of carbo/taxol, last on 06/16/13.  CT chest/abdomen/pelvis on 05/09/13 prior to completion of chemotherapy noted  1. Near complete resolution of the bulky the peritoneal implants. 2. Marked reduction in size of periaortic adenopathy with lymph nodes now less than 5 mm. 3. Marked reduction in the volume of the cystic and solid pelvic mass with now only a normal volume of right ovarian tissue measurable. 1. No evidence of disease progression in the thorax. 2. Interval decrease in size of mediastinal and supraclavicular lymphadenopathy.  CA 125 on 05/11/13 was 39.9.  On 07/26/2013 she underwent exploratory laparotomy total abdominal hysterectomy bilateral salpingo-oophorectomy omentectomy with and peritoneal excision.  Gross disease was notable only within the cul-de-sac and this was removed. Final pathology was consistent with serous carcinoma with associated fibrosis and psammoma bodies in the soft tissue biopsies of the peritoneum. Residual serous carcinoma was noted on the left ovary and the uterine serosa. Psammoma bodies were reported on the right ovary and bilateral fallopian tubes and also within the omentum.  Genetic testing (panel) negative  Postoperatively she received an additional 6  cycles taxol/Platin completed 10/2013. CT 11/23/2013 IMPRESSION:  1. Interval hysterectomy. No evidence of residual or metastatic  disease.  2. Cholelithiasis  Lab Results  Component Value Date   CA125 20 12/13/2015  CA 125  nadir of 9.   Currently without symptoms of recurrent disease. Declines intervention unless symptomatic. Denies nausea vomiting diarrhea no abdominal bloating no change in abdominal girth, no changes in bowel or bladder habits good appetite.   Review of Systems  Constitutional: Feels well.  No fever, chills, early satiety, continues to gain weight Cardiovascular: No chest pain, shortness of breath, or edema.  Pulmonary: No cough or wheeze.  Gastrointestinal: No nausea, vomiting, or diarrhea. No bright red blood per rectum or change in bowel movement.  Genitourinary: No frequency, urgency, or dysuria. No vaginal bleeding or discharge.  Musculoskeletal: No myalgia or joint pain. Neurologic: No weakness, numbness, or change in gait.  Psychology: No depression, feels well.  Has a lot of social support.   Past Surgical Hx:  Past Surgical History:  Procedure Laterality Date  . ABDOMINAL HYSTERECTOMY Bilateral 07/26/2013   Procedure: EXPLORATORY LAPAROTOMY, TOTAL ABDOMINAL HYSTERECTOMY ,BILATERAL SALPINGO OOPHORECTOMY , OMENTECTOMY    ;  Surgeon: Janie Morning, MD;  Location: [facility];  Service: Gynecology;  Laterality: Bilateral;  . IR GENERIC HISTORICAL  08/15/2016   IR REMOVAL TUN ACCESS W/ PORT W/O FL MOD SED 08/15/2016 Corrie Mckusick, DO [facility]  . LEEP     20 year ago  . LYMPH NODE BIOPSY Left 01/21/2013   Procedure: EXCISIONAL BIOPSY OF THE NECK;  Surgeon: Izora Gala, MD;  Location: [place];  Service: ENT;  Laterality: Left;  Marland Kitchen VASCULAR ACCESS DEVICE INSERTION Right    port a cath   . WISDOM TOOTH EXTRACTION      Past Medical Hx:  Past Medical History:  Diagnosis Date  . Anxiety   . Hypertension   . Mental disorder   . MVP (mitral valve prolapse)   .  Ovarian cancer on right ([place])   . PONV (postoperative nausea and vomiting)   . Regional lymph node metastasis present ([place])   . Shortness of breath    at times "anxiety"  Colonoscopy 10-Dec-2011 Mammogram to be scheduled for 01/2015 (H/o abn pap 20 years ago treated with cryotherapy) Pap 09-Dec-2014 - hrHPV neg   Family Hx:  Family History  Problem Relation Age of Onset  . Heart attack Father   . Melanoma Maternal Uncle     dx in his 74s; mets to brain  . Dementia Maternal Grandmother   . COPD Maternal Grandfather   . Dementia Paternal Grandmother   . COPD Paternal Grandfather   . Testicular cancer Son 49   Social History:   Husband died suddenly n 2015/12/10.  Christmas without her husband last year was difficult.    Vitals:  Blood pressure (!) 158/85, pulse (!) 109, temperature 97.8 F (36.6 C), temperature source Oral, resp. rate 17, height 5' 6"  (1.676 m), weight 162 lb 8 oz (73.7 kg), SpO2 100 %. Wt Readings from Last 3 Encounters:  11/10/16 162 lb 8 oz (73.7 kg)  07/10/16 155 lb 6.4 oz (70.5 kg)  04/10/16 151 lb 6.4 oz (68.7 kg)    Physical Exam:  General: Well developed, well nourished female in no acute distress. Alert and oriented x 3.  Cardiovascular: Regular rate and rhythm. S1 and S2 normal.  Lungs: Clear to auscultation bilaterally.   Skin: No rashes or lesions present.    Pelvic: Normal external  genitalia Bartholin's urethra Skene's gland.  No cul-de-sac masses Rectal:  Good tone no masses, no tenderness Back: No CVA tenderness.  Extremities: No bilateral cyanosis, edema, or clubbing.  LN:  No cervical supra clavicular or inguinal adenopathy

## 2016-11-10 ENCOUNTER — Ambulatory Visit: Payer: BLUE CROSS/BLUE SHIELD | Attending: Gynecologic Oncology | Admitting: Gynecologic Oncology

## 2016-11-10 ENCOUNTER — Encounter: Payer: Self-pay | Admitting: Gynecologic Oncology

## 2016-11-10 VITALS — BP 158/85 | HR 109 | Temp 97.8°F | Resp 17 | Ht 66.0 in | Wt 162.5 lb

## 2016-11-10 DIAGNOSIS — C561 Malignant neoplasm of right ovary: Secondary | ICD-10-CM | POA: Insufficient documentation

## 2016-11-10 DIAGNOSIS — F419 Anxiety disorder, unspecified: Secondary | ICD-10-CM | POA: Diagnosis not present

## 2016-11-10 DIAGNOSIS — Z90710 Acquired absence of both cervix and uterus: Secondary | ICD-10-CM | POA: Insufficient documentation

## 2016-11-10 NOTE — Patient Instructions (Signed)
Follow up with Dr Skeet Latch in 6 months from today.  Or if your primary care doctor will agree to do pelvic exams then you can follow up with Dr Skeet Latch 1 year.  Need to have pelvic exam every 6 months.

## 2017-11-13 ENCOUNTER — Other Ambulatory Visit: Payer: Self-pay | Admitting: Family Medicine

## 2017-11-13 DIAGNOSIS — Z1231 Encounter for screening mammogram for malignant neoplasm of breast: Secondary | ICD-10-CM

## 2020-05-02 ENCOUNTER — Telehealth: Payer: Self-pay | Admitting: Genetic Counselor

## 2020-05-02 NOTE — Telephone Encounter (Signed)
Ms. Holton called to request a copy of her genetic test report from 2014 for her daughter. A copy of the report has been sent.
